# Patient Record
Sex: Female | Born: 1980 | Race: Black or African American | Hispanic: No | Marital: Married | Smoking: Never smoker
Health system: Southern US, Community
[De-identification: ages and names within clinical notes are randomized; demographics above are authoritative.]

## PROBLEM LIST (undated history)

## (undated) DIAGNOSIS — F329 Major depressive disorder, single episode, unspecified: Secondary | ICD-10-CM

## (undated) DIAGNOSIS — E669 Obesity, unspecified: Secondary | ICD-10-CM

## (undated) DIAGNOSIS — K219 Gastro-esophageal reflux disease without esophagitis: Secondary | ICD-10-CM

## (undated) DIAGNOSIS — I10 Essential (primary) hypertension: Secondary | ICD-10-CM

## (undated) DIAGNOSIS — F419 Anxiety disorder, unspecified: Secondary | ICD-10-CM

## (undated) DIAGNOSIS — F32A Depression, unspecified: Secondary | ICD-10-CM

## (undated) HISTORY — DX: Anxiety disorder, unspecified: F41.9

## (undated) HISTORY — DX: Major depressive disorder, single episode, unspecified: F32.9

## (undated) HISTORY — DX: Depression, unspecified: F32.A

## (undated) HISTORY — DX: Obesity, unspecified: E66.9

---

## 2001-07-21 ENCOUNTER — Emergency Department (HOSPITAL_COMMUNITY): Admission: EM | Admit: 2001-07-21 | Discharge: 2001-07-21 | Payer: Self-pay | Admitting: Emergency Medicine

## 2001-07-21 ENCOUNTER — Encounter: Payer: Self-pay | Admitting: Emergency Medicine

## 2003-06-15 ENCOUNTER — Other Ambulatory Visit: Admission: RE | Admit: 2003-06-15 | Discharge: 2003-06-15 | Payer: Self-pay | Admitting: Gynecology

## 2003-09-06 ENCOUNTER — Other Ambulatory Visit: Admission: RE | Admit: 2003-09-06 | Discharge: 2003-09-06 | Payer: Self-pay | Admitting: Gynecology

## 2005-07-31 ENCOUNTER — Other Ambulatory Visit: Admission: RE | Admit: 2005-07-31 | Discharge: 2005-07-31 | Payer: Self-pay | Admitting: Gynecology

## 2008-06-16 ENCOUNTER — Ambulatory Visit: Payer: Self-pay | Admitting: Women's Health

## 2008-06-16 ENCOUNTER — Other Ambulatory Visit: Admission: RE | Admit: 2008-06-16 | Discharge: 2008-06-16 | Payer: Self-pay | Admitting: Gynecology

## 2008-06-16 ENCOUNTER — Encounter: Payer: Self-pay | Admitting: Women's Health

## 2008-06-21 ENCOUNTER — Ambulatory Visit: Payer: Self-pay | Admitting: Women's Health

## 2008-07-17 ENCOUNTER — Ambulatory Visit: Payer: Self-pay | Admitting: Women's Health

## 2008-07-25 ENCOUNTER — Encounter: Admission: RE | Admit: 2008-07-25 | Discharge: 2008-10-23 | Payer: Self-pay | Admitting: Endocrinology

## 2009-07-24 ENCOUNTER — Ambulatory Visit: Payer: Self-pay | Admitting: Women's Health

## 2009-07-24 ENCOUNTER — Other Ambulatory Visit: Admission: RE | Admit: 2009-07-24 | Discharge: 2009-07-24 | Payer: Self-pay | Admitting: Gynecology

## 2010-09-26 ENCOUNTER — Telehealth: Payer: Self-pay

## 2010-09-26 NOTE — Telephone Encounter (Signed)
Patient called to report spotting x one week. Dark spotting. Usually has a few days of spotting before period starts but never a week.  Thinks her period is not due for about 5 more days.  Her main concern was cancer and I told her that was probably remote and unlikely and more likely to be something hormonal.  I asked her about the possibility of pregnancy and she said no but that she had been intimate and her partner did enter vagina with his penis but they stopped and did not have sex.  I told her that pregnancy could be another possibility although probably not likely. I recommended an office visit. [place] is off tomorrow and patient could not come Monday. She chose [place] 10/02/10 and she said that way she could see if her period does just start and she might cancel. I told her if anything abnormal about period or anymore spotting she would definitely want to be seen. Office visit scheduled 10/02/10 at 8:30am

## 2010-09-30 ENCOUNTER — Ambulatory Visit (INDEPENDENT_AMBULATORY_CARE_PROVIDER_SITE_OTHER): Payer: BC Managed Care – PPO | Admitting: Women's Health

## 2010-09-30 ENCOUNTER — Encounter: Payer: Self-pay | Admitting: Women's Health

## 2010-09-30 DIAGNOSIS — N760 Acute vaginitis: Secondary | ICD-10-CM

## 2010-09-30 DIAGNOSIS — A499 Bacterial infection, unspecified: Secondary | ICD-10-CM

## 2010-09-30 DIAGNOSIS — B9689 Other specified bacterial agents as the cause of diseases classified elsewhere: Secondary | ICD-10-CM

## 2010-09-30 DIAGNOSIS — N949 Unspecified condition associated with female genital organs and menstrual cycle: Secondary | ICD-10-CM

## 2010-09-30 DIAGNOSIS — N938 Other specified abnormal uterine and vaginal bleeding: Secondary | ICD-10-CM

## 2010-09-30 DIAGNOSIS — B373 Candidiasis of vulva and vagina: Secondary | ICD-10-CM

## 2010-09-30 MED ORDER — METRONIDAZOLE 500 MG PO TABS: 500.0000 mg | ORAL_TABLET | Freq: Two times a day (BID) | ORAL | Status: AC

## 2010-09-30 MED ORDER — FLUCONAZOLE 150 MG PO TABS: 150.0000 mg | ORAL_TABLET | Freq: Once | ORAL | Status: AC

## 2010-09-30 NOTE — Progress Notes (Signed)
  Presents with a complaint of spotting and unusual bleeding for about 2 weeks. LMP 8/15, states normal flow and normal time. States has a monthly cycle for about 5 days, uses condoms for contraception. She has had a new partner within the last year. Denies abdominal pain states has had some rest tenderness. She is also diabetic on glyburide. External genitalia is within normal limits, speculum exam moderate amount of menses type blood was noted with an odor. GC Chlamydia culture was taken and is pending, wet prep was positive for yeast and BV. Bimanual no CMT no adnexal fullness or tenderness she does weigh greater than 250, difficult to palpate uterine size.UPT-  Negative.  DUB x2 weeks, yeast and BV  Plan: Flagyl 500 by mouth twice a day for 7d, alcohol precautions were reviewed, Diflucan 150 by mouth x1 dose. Her cycle is due,.  Schedule annual exam.

## 2010-10-01 LAB — POCT URINE PREGNANCY: Preg Test, Ur: NEGATIVE

## 2010-10-01 NOTE — Progress Notes (Signed)
Addended by: Landis Martins R on: 10/01/2010 10:08 AM   Modules accepted: Orders

## 2010-10-02 ENCOUNTER — Ambulatory Visit: Payer: Self-pay | Admitting: Women's Health

## 2011-04-12 ENCOUNTER — Encounter (HOSPITAL_BASED_OUTPATIENT_CLINIC_OR_DEPARTMENT_OTHER): Payer: Self-pay | Admitting: *Deleted

## 2011-04-12 ENCOUNTER — Emergency Department (HOSPITAL_BASED_OUTPATIENT_CLINIC_OR_DEPARTMENT_OTHER)
Admission: EM | Admit: 2011-04-12 | Discharge: 2011-04-12 | Disposition: A | Discharge status: Home / Self Care | Payer: BC Managed Care – PPO | Attending: Emergency Medicine | Admitting: Emergency Medicine

## 2011-04-12 DIAGNOSIS — T169XXA Foreign body in ear, unspecified ear, initial encounter: Secondary | ICD-10-CM | POA: Insufficient documentation

## 2011-04-12 DIAGNOSIS — E119 Type 2 diabetes mellitus without complications: Secondary | ICD-10-CM | POA: Insufficient documentation

## 2011-04-12 DIAGNOSIS — IMO0002 Reserved for concepts with insufficient information to code with codable children: Secondary | ICD-10-CM | POA: Insufficient documentation

## 2011-04-12 NOTE — ED Notes (Signed)
Pt d/c home- no rx given-  

## 2011-04-12 NOTE — ED Notes (Signed)
Pt presents to ED today with part of a q-tip in left ear.  Pt attempted at home to remove with no success.

## 2011-04-12 NOTE — Discharge Instructions (Signed)
Ear Foreign Body An ear foreign body is an object that is stuck in the ear. Objects in the ear can cause pain, hearing loss, and buzzing or roaring sounds. They can also cause fluid to come from the ear. HOME CARE   Keep all doctor visits as told.   Keep small objects away from children. Tell them not to put things in their ears.  GET HELP RIGHT AWAY IF:   You have blood coming from your ear.   You have more pain or puffiness (swelling) in the ear.   You have trouble hearing.   You have fluid (discharge) coming from the ear.   You have a fever.   You get a headache.  MAKE SURE YOU:   Understand these instructions.   Will watch your condition.   Will get help right away if you are not doing well or get worse.  Document Released: 06/26/2009 Document Revised: 12/26/2010 Document Reviewed: 06/26/2009 Exit[employer] Patient Information 2012 [employer]. 

## 2011-04-12 NOTE — ED Provider Notes (Signed)
History     CSN: [id number]  Arrival date & time 04/12/11  2134   First MD Initiated Contact with Patient 04/12/11 2238      Chief Complaint  Patient presents with  . Foreign Body in Ear    (Consider location/radiation/quality/duration/timing/severity/associated sxs/prior treatment) HPI Comments: Pt states that she got piece of the q-tip in her ear:pt states that it happened earlier  Patient is a 31 y.o. female presenting with foreign body in ear. The history is provided by the patient. No language interpreter was used.  Foreign Body in Ear This is a new problem. The current episode started today. The problem occurs constantly. The problem has been unchanged.    Past Medical History  Diagnosis Date  . Diabetes mellitus 05/2008    DR. BALAN  . Obesity     History reviewed. No pertinent past surgical history.  Family History  Problem Relation Age of Onset  . Diabetes Mother   . Hypertension Mother   . Diabetes Maternal Aunt   . Diabetes Maternal Grandfather     History  Substance Use Topics  . Smoking status: Never Smoker   . Smokeless tobacco: Never Used  . Alcohol Use: No    OB History    Grav Para Term Preterm Abortions TAB SAB Ect Mult Living                  Review of Systems  Constitutional: Negative.   Eyes: Negative.   Respiratory: Negative.   Cardiovascular: Negative.     Allergies  Metformin and related  Home Medications   Current Outpatient Rx  Name Route Sig Dispense Refill  . GLYBURIDE 2.5 MG PO TABS Oral Take 1.25 mg by mouth 2 (two) times daily with a meal.    . MULTIVITAMIN GUMMIES ADULT PO Oral Take 2 each by mouth daily.      BP 138/88  Pulse 100  Temp(Src) 97.8 F (36.6 C) (Oral)  Resp 20  SpO2 98%  Physical Exam  Nursing note and vitals reviewed. Constitutional: She appears well-developed and well-nourished.  HENT:  Right Ear: External ear normal.  Left Ear: External ear normal.       fb noted in the left ear canal   Eyes: EOM are normal.  Neck: Neck supple.  Pulmonary/Chest: Effort normal and breath sounds normal.    ED Course  Procedures (including critical care time)  Labs Reviewed - No data to display No results found.   1. Foreign body in ear       MDM  Cotton ball removed with tweezers:pt tolerated without any problem:tm intact after procedure        Teressa Lower, NP 04/12/11 2344

## 2011-04-13 NOTE — ED Provider Notes (Signed)
Medical screening examination/treatment/procedure(s) were performed by non-physician practitioner and as supervising physician I was immediately available for consultation/collaboration.  Shaliyah Taite T Abdulloh Ullom, MD 04/13/11 2330 

## 2011-08-20 ENCOUNTER — Ambulatory Visit: Payer: BC Managed Care – PPO | Admitting: Women's Health

## 2011-08-21 ENCOUNTER — Ambulatory Visit: Payer: BC Managed Care – PPO | Admitting: Women's Health

## 2011-09-01 ENCOUNTER — Ambulatory Visit: Payer: BC Managed Care – PPO | Admitting: Women's Health

## 2012-02-08 ENCOUNTER — Emergency Department (HOSPITAL_BASED_OUTPATIENT_CLINIC_OR_DEPARTMENT_OTHER)
Admission: EM | Admit: 2012-02-08 | Discharge: 2012-02-08 | Disposition: A | Discharge status: Home / Self Care | Payer: BC Managed Care – PPO | Attending: Emergency Medicine | Admitting: Emergency Medicine

## 2012-02-08 ENCOUNTER — Encounter (HOSPITAL_BASED_OUTPATIENT_CLINIC_OR_DEPARTMENT_OTHER): Payer: Self-pay | Admitting: *Deleted

## 2012-02-08 DIAGNOSIS — Y9301 Activity, walking, marching and hiking: Secondary | ICD-10-CM | POA: Insufficient documentation

## 2012-02-08 DIAGNOSIS — E119 Type 2 diabetes mellitus without complications: Secondary | ICD-10-CM | POA: Insufficient documentation

## 2012-02-08 DIAGNOSIS — S91309A Unspecified open wound, unspecified foot, initial encounter: Secondary | ICD-10-CM | POA: Insufficient documentation

## 2012-02-08 DIAGNOSIS — W1809XA Striking against other object with subsequent fall, initial encounter: Secondary | ICD-10-CM | POA: Insufficient documentation

## 2012-02-08 DIAGNOSIS — Z79899 Other long term (current) drug therapy: Secondary | ICD-10-CM | POA: Insufficient documentation

## 2012-02-08 DIAGNOSIS — Y9289 Other specified places as the place of occurrence of the external cause: Secondary | ICD-10-CM | POA: Insufficient documentation

## 2012-02-08 DIAGNOSIS — E669 Obesity, unspecified: Secondary | ICD-10-CM | POA: Insufficient documentation

## 2012-02-08 DIAGNOSIS — I10 Essential (primary) hypertension: Secondary | ICD-10-CM | POA: Insufficient documentation

## 2012-02-08 DIAGNOSIS — Z23 Encounter for immunization: Secondary | ICD-10-CM | POA: Insufficient documentation

## 2012-02-08 DIAGNOSIS — S91319A Laceration without foreign body, unspecified foot, initial encounter: Secondary | ICD-10-CM

## 2012-02-08 HISTORY — DX: Essential (primary) hypertension: I10

## 2012-02-08 MED ORDER — CEPHALEXIN 500 MG PO CAPS: 500.0000 mg | ORAL_CAPSULE | Freq: Four times a day (QID) | ORAL | Status: DC

## 2012-02-08 MED ORDER — TETANUS-DIPHTH-ACELL PERTUSSIS 5-2.5-18.5 LF-MCG/0.5 IM SUSP
0.5000 mL | Freq: Once | INTRAMUSCULAR | Status: AC
  Administered 2012-02-08: 0.5 mL via INTRAMUSCULAR
  Filled 2012-02-08: qty 0.5

## 2012-02-08 NOTE — ED Provider Notes (Signed)
History   This chart was scribed for Geoffery Lyons, MD by Melba Coon, ED Scribe. The patient was seen in room MH01/MH01 and the patient's care was started at 10:25PM.    CSN: [id number]  Arrival date & time 02/08/12  2204   First MD Initiated Contact with Patient 02/08/12 2224      Chief Complaint  Patient presents with  . Foot Injury    (Consider location/radiation/quality/duration/timing/severity/associated sxs/prior treatment) The history is provided by the patient. No language interpreter was used.   [name] [name] is a 32 y.o. female who presents to the Emergency Department complaining of left foot pain pertaining to a fall and laceration with an onset a hour ago. She reports she was walking down the stairs when she slipped and cut her foot. She was ambulatory after the fall but ambulation is decreased compared to baseline. However, she reports that the pain in her foot and her ambulation has gotten better since onset. Denies HA, fever, neck pain, sore throat, rash, back pain, CP, SOB, abdominal pain, nausea, emesis, diarrhea, dysuria, or extremity edema, weakness, numbness, or tingling. Hx of DM. Tetanus shot is not up to date. No other pertinent medical symptoms.   Past Medical History  Diagnosis Date  . Diabetes mellitus 05/2008    DR. BALAN  . Obesity   . Hypertension     History reviewed. No pertinent past surgical history.  Family History  Problem Relation Age of Onset  . Diabetes Mother   . Hypertension Mother   . Diabetes Maternal Aunt   . Diabetes Maternal Grandfather     History  Substance Use Topics  . Smoking status: Never Smoker   . Smokeless tobacco: Never Used  . Alcohol Use: No    OB History    Grav Para Term Preterm Abortions TAB SAB Ect Mult Living                  Review of Systems 10 Systems reviewed and all are negative for acute change except as noted in the HPI.   Allergies  Metformin and related  Home Medications   Current  Outpatient Rx  Name  Route  Sig  Dispense  Refill  . LISINOPRIL 5 MG PO TABS   Oral   Take 5 mg by mouth daily.         . GLYBURIDE 2.5 MG PO TABS   Oral   Take 1.25 mg by mouth 2 (two) times daily with a meal.         . MULTIVITAMIN GUMMIES ADULT PO   Oral   Take 2 each by mouth daily.           BP 120/80  Pulse 106  Temp 98.3 F (36.8 C) (Oral)  Resp 18  Ht 5\' 9"  (1.753 m)  Wt 300 lb (136.079 kg)  BMI 44.30 kg/m2  SpO2 99%  LMP 01/18/2012  Physical Exam  Nursing note and vitals reviewed. Constitutional:       Awake, alert, nontoxic appearance.  HENT:  Head: Atraumatic.  Eyes: Right eye exhibits no discharge. Left eye exhibits no discharge.  Neck: Neck supple.  Pulmonary/Chest: Effort normal. She exhibits no tenderness.  Abdominal: Soft. There is no tenderness. There is no rebound.  Musculoskeletal: She exhibits tenderness (left foot).       Baseline ROM, no obvious new focal weakness.  Neurological:       Mental status and motor strength appears baseline for patient and situation.  Skin:  Left foot: tissues between the 4th and 5th toes and 3rd and 4th toes have small 1 cm superficial lacerations without active bleeding. No swelling or deformity of the toes or foot.  Psychiatric: She has a normal mood and affect.    ED Course  Procedures (including critical care time)  DIAGNOSTIC STUDIES: Oxygen Saturation is 99% on room air, normal by my interpretation.    COORDINATION OF CARE:  10:30PM - Tetanus shot and keflex will be ordered for [name] [name]. She is advised to soak her foot in warm water and keep the lacerated area of the left foot clean. She is ready for d/c.    Labs Reviewed - No data to display No results found.   No diagnosis found.    MDM  There are lacerations between the toes that do not require repair.  Tdap updated.  As she is diabetic, I will prescribe a short course of keflex.  Local wound care and return prn.       I  personally performed the services described in this documentation, which was scribed in my presence. The recorded information has been reviewed and is accurate.         Geoffery Lyons, MD 02/08/12 [id number]

## 2012-02-08 NOTE — ED Notes (Signed)
D/c home with rx x 1 for keflex- wound care discussed and pt verbalized understanding- tdap info sheet given

## 2012-02-08 NOTE — ED Notes (Signed)
Pt states she fell down 3 or 4 steps this evening hurting her left. C/o 4th and 5th toes hurting and a small laceration noted to the posterior aspect of left foot less than one inch. Able to ambulate but painful per pt. States she will need her Td updated. Pt. Would like to be evaluated by the MD before xrays ordered.

## 2012-02-27 ENCOUNTER — Ambulatory Visit (INDEPENDENT_AMBULATORY_CARE_PROVIDER_SITE_OTHER): Payer: BC Managed Care – PPO | Admitting: General Surgery

## 2012-02-27 ENCOUNTER — Encounter (INDEPENDENT_AMBULATORY_CARE_PROVIDER_SITE_OTHER): Payer: Self-pay | Admitting: General Surgery

## 2012-02-27 DIAGNOSIS — Z6841 Body Mass Index (BMI) 40.0 and over, adult: Secondary | ICD-10-CM

## 2012-02-27 DIAGNOSIS — I10 Essential (primary) hypertension: Secondary | ICD-10-CM

## 2012-02-27 DIAGNOSIS — E119 Type 2 diabetes mellitus without complications: Secondary | ICD-10-CM

## 2012-02-27 NOTE — Progress Notes (Signed)
Patient ID: [name] [name], female   DOB: 02-17-1980, 32 y.o.   MRN: [id number]  Chief Complaint  Patient presents with  . Bariatric Pre-op    HPI [name] [name] is a 32 y.o. female.  This patient presents for her initial weight loss surgery evaluation. She has a BMI of 44 with comorbidities of diabetes mellitus and hypertension. She says that she struggled with her weight since her early 7s when she began making some poor food choices and working multiple jobs. She has attended our information session and is interested in lap band. She is interested in a slower weight loss and is not interested in a sleeve gastrectomy due to the irreversible nature of the procedure. She is fairly recent bleed diagnosed with diabetes or last 3 years and her last A1c was around 8. She says that when she does exercise she does do request is and enjoys walking outdoors. She denies any history of heartburn or reflux HPI  Past Medical History  Diagnosis Date  . Diabetes mellitus 05/2008    DR. BALAN  . Obesity   . Hypertension     History reviewed. No pertinent past surgical history.  Family History  Problem Relation Age of Onset  . Diabetes Mother   . Hypertension Mother   . Diabetes Maternal Aunt   . Diabetes Maternal Grandfather     Social History History  Substance Use Topics  . Smoking status: Never Smoker   . Smokeless tobacco: Never Used  . Alcohol Use: No    Allergies  Allergen Reactions  . Metformin And Related Itching    Current Outpatient Prescriptions  Medication Sig Dispense Refill  . cephALEXin (KEFLEX) 500 MG capsule Take 1 capsule (500 mg total) by mouth 4 (four) times daily.  25 capsule  0  . glyBURIDE (DIABETA) 2.5 MG tablet Take 1.25 mg by mouth 2 (two) times daily with a meal.      . lisinopril (PRINIVIL,ZESTRIL) 5 MG tablet Take 5 mg by mouth daily.      . Multiple Vitamins-Minerals (MULTIVITAMIN GUMMIES ADULT PO) Take 2 each by mouth daily.        Review of  Systems Review of Systems All other review of systems negative or noncontributory except as stated in the HPI  Blood pressure 142/84, pulse 88, temperature 97.1 F (36.2 C), temperature source Temporal, resp. rate 16, height 5' 9.5" (1.765 m), weight 305 lb 3.2 oz (138.438 kg), last menstrual period 01/18/2012.  Physical Exam Physical Exam Physical Exam  Nursing note and vitals reviewed. Constitutional: She is oriented to person, place, and time. She appears well-developed and well-nourished. No distress.  HENT:  Head: Normocephalic and atraumatic.  Mouth/Throat: No oropharyngeal exudate.  Eyes: Conjunctivae and EOM are normal. Pupils are equal, round, and reactive to light. Right eye exhibits no discharge. Left eye exhibits no discharge. No scleral icterus.  Neck: Normal range of motion. Neck supple. No tracheal deviation present.  Cardiovascular: Normal rate, regular rhythm, normal heart sounds and intact distal pulses.   Pulmonary/Chest: Effort normal and breath sounds normal. No stridor. No respiratory distress. She has no wheezes.  Abdominal: Soft. Bowel sounds are normal. She exhibits no distension and no mass. There is no tenderness. There is no rebound and no guarding.  Musculoskeletal: Normal range of motion. She exhibits no edema and no tenderness.  Neurological: She is alert and oriented to person, place, and time.  Skin: Skin is warm and dry. No rash noted. She is not diaphoretic.  No erythema. No pallor.  Psychiatric: She has a normal mood and affect. Her behavior is normal. Judgment and thought content normal.    Data Reviewed   Assessment    Morbid obesity with a BMI of 44 and obesity related comorbidities of diabetes mellitus and hypertension We discussed all of the nonsurgical and surgical weight loss options including the lap band, a sleeve gastrectomy, and Roux-en-Y gastric bypass. She is most interested in the lap band. We discussed the pros and cons of the surgery  and the risks and benefits. We discussed the risks of infection, bleeding, pain, scarring, 2 littler too much weight loss, dysphagia, esophageal dilation in dysmotility, erosion, slippage, vitamin deficiencies, and the need for reoperative surgery and she expressed understanding and would like to proceed with lap band.    Plan    We will set her up for her nutrition labs an upper GI and get her to see a psychologist and nutritionist for their initial weight loss surgery evaluations       Brady Schiller DAVID 02/27/2012, 12:59 PM

## 2012-03-16 ENCOUNTER — Ambulatory Visit (HOSPITAL_COMMUNITY)
Admission: RE | Admit: 2012-03-16 | Discharge: 2012-03-16 | Disposition: A | Discharge status: Home / Self Care | Payer: BC Managed Care – PPO | Source: Ambulatory Visit | Attending: General Surgery | Admitting: General Surgery

## 2012-03-16 ENCOUNTER — Encounter (HOSPITAL_COMMUNITY): Payer: Self-pay

## 2012-03-16 DIAGNOSIS — I10 Essential (primary) hypertension: Secondary | ICD-10-CM | POA: Insufficient documentation

## 2012-03-16 DIAGNOSIS — Z6841 Body Mass Index (BMI) 40.0 and over, adult: Secondary | ICD-10-CM | POA: Insufficient documentation

## 2012-03-16 DIAGNOSIS — E119 Type 2 diabetes mellitus without complications: Secondary | ICD-10-CM

## 2012-03-17 ENCOUNTER — Ambulatory Visit: Payer: BC Managed Care – PPO | Admitting: *Deleted

## 2012-03-24 ENCOUNTER — Encounter: Payer: Self-pay | Admitting: *Deleted

## 2012-03-24 ENCOUNTER — Encounter: Payer: BC Managed Care – PPO | Attending: General Surgery | Admitting: *Deleted

## 2012-03-24 DIAGNOSIS — Z713 Dietary counseling and surveillance: Secondary | ICD-10-CM | POA: Insufficient documentation

## 2012-03-24 NOTE — Progress Notes (Addendum)
  Pre-Op Assessment Visit:  Pre-Operative RYGB Surgery  Medical Nutrition Therapy:  Appt start time: 0800   End time:  0900.  Patient was seen on 03/24/2012 for Pre-Operative RYGB Nutrition Assessment. Assessment and letter of approval faxed to [facility] Bariatric Surgery Program coordinator on 03/24/2012.  Approval letter sent to [facility] and will be available in the chart under the media tab.  Handouts given during visit include:  Pre-Op Goals   Bariatric Surgery Protein Shakes  Patient to call for Pre-Op and Post-Op Nutrition Education at the [facility] when surgery is scheduled.

## 2012-03-24 NOTE — Patient Instructions (Addendum)
   Follow Pre-Op Nutrition Goals to prepare for Gastric Bypass Surgery.   Call the [facility] at [phone] once you have been given your surgery date to enrolled in the Pre-Op Nutrition Class. You will need to attend this nutrition class 3-4 weeks prior to your surgery. 

## 2012-04-20 ENCOUNTER — Encounter: Payer: Self-pay | Admitting: *Deleted

## 2012-04-20 ENCOUNTER — Encounter: Payer: BC Managed Care – PPO | Attending: General Surgery | Admitting: *Deleted

## 2012-04-20 DIAGNOSIS — Z713 Dietary counseling and surveillance: Secondary | ICD-10-CM | POA: Insufficient documentation

## 2012-04-20 NOTE — Progress Notes (Signed)
  6 Months Supervised Weight Loss Visit:   Pre-Operative RYGB Surgery  Medical Nutrition Therapy:  Appt start time: 0830 end time:  0845.  Primary concerns today:   Pre-operative Bariatric Surgery Supervised Weight Loss. Has decreased soda from 1 daily to 1 per week. Paying attention to serving sizes and replacing foods like french fries with soup. Plans to start walking with roommate as weather warms.   Weight today: 309.5 lbs Weight change: 1.7 lb GAIN BMI: 45.0   Medications: No changes reported Supplementation: MVI  24-hr recall: B (AM): Belvita (1 pk), 5 oz greek yogurt (Chobani Vanilla) Snk (AM): NONE or popcorn  L (PM): Chick-fil-a sandwich (wheat bun), cup of chicken tortilla soup, water or diet drink Snk (PM): NONE or popcorn or banana Fig Newton Crisps (1 svg) D (PM): Salmon or tilapia w/ spinach, asparagus or broccoli and brown rice; water Snk (PM):  NONE or 1.5 c Cheerios w/ 2% milk (if dinner is early)  CBG monitoring: Weekly Average FBG per patient: 117 mg (@ Dr. Talmage Nap) Last patient reported A1c: 8.0% (Jan 2014)  Recent physical activity:  None at this time.   Progress Towards Goal(s):  In progress.   Nutritional Diagnosis:  [place]-3.3 Obesity related to past poor dietary habits and physical inactivity as evidenced by patient attending supervised weight loss for insurance approval of bariatric surgery.    Intervention:  Nutrition education including CHO metabolism and effect on weight gain/loss. Also discussed importance of exercise.   Monitoring/Evaluation:  Dietary intake, exercise, and body weight. Follow up in 1 month for 3rd supervised weight loss visit.

## 2012-04-20 NOTE — Patient Instructions (Addendum)
   Continue to follow Pre-Op Nutrition Goals to prepare for Gastric Bypass Surgery.  Continue replacing high fat and high carb foods with healthier choices.  Aim for >30 min of physical activity daily.   Call the [facility] at [phone] once you have been given your surgery date to enrolled in the Pre-Op Nutrition Class. You will need to attend this nutrition class 3-4 weeks prior to your surgery.

## 2012-05-27 ENCOUNTER — Encounter: Payer: BC Managed Care – PPO | Attending: General Surgery | Admitting: *Deleted

## 2012-05-27 DIAGNOSIS — Z713 Dietary counseling and surveillance: Secondary | ICD-10-CM | POA: Insufficient documentation

## 2012-05-27 NOTE — Progress Notes (Addendum)
  6 Months Supervised Weight Loss Visit:   Pre-Operative RYGB Surgery  Medical Nutrition Therapy:  Appt start time: 0945  End time:  1000.  Primary concerns today:   Pre-operative Bariatric Surgery Supervised Weight Loss. Continues to work on goals set at previous visits. Down 3 lbs.   Weight today: 306.5 lbs Weight change: 3.0 lbs BMI:  44.6  Medications: No changes reported Supplementation: MVI  CBG monitoring: Weekly Average FBG per patient: 117 mg (@ Dr. Talmage Nap) Last patient reported A1c: 8.0% (Jan 2014)  Recent physical activity:  None at this time.   Estimated daily energy needs (for wt loss) 1500 calories 185 g CHO 70-80 g protein 45 g fat  Progress Towards Goal(s):  In progress.   Nutritional Diagnosis:  [place]-3.3 Obesity related to past poor dietary habits and physical inactivity as evidenced by patient attending supervised weight loss for insurance approval of bariatric surgery.    Intervention:  Continued nutrition education on importance of exercise both pre- and post-op.  Monitoring/Evaluation:  Dietary intake, exercise, and body weight. Follow up in 1 month for 4th supervised weight loss visit.

## 2012-05-27 NOTE — Patient Instructions (Addendum)
Goals:  Eat 3 meals/day, Avoid meal skipping   Have lean, protein rich foods with all carbohydrates  Limit carbohydrate 2 servings/meal and 1 serving/snack  Choose more whole grains, lean protein, low-fat dairy, and fruits/non-starchy vegetables.   Aim for >30 min of physical activity daily  Limit sugar-sweetened beverages and concentrated sweets

## 2012-07-01 ENCOUNTER — Encounter: Payer: BC Managed Care – PPO | Attending: General Surgery | Admitting: *Deleted

## 2012-07-01 DIAGNOSIS — Z713 Dietary counseling and surveillance: Secondary | ICD-10-CM | POA: Insufficient documentation

## 2012-07-01 NOTE — Progress Notes (Signed)
  6 Months Supervised Weight Loss Visit:   Pre-Operative RYGB Surgery  Medical Nutrition Therapy:  Appt start time: 0930  End time:  1000.  Primary concerns today:   Pre-operative Bariatric Surgery Supervised Weight Loss. Continues to work on goals set at previous visits, though up 1.2 lbs.   Weight today: 307.7 lbs Weight change: 1.2 lb GAIN BMI:  44.8 kg/m^2  Medications: No changes reported Supplementation: MVI  Last patient reported A1c: 8.0% (Jan 2014)  Recent physical activity:  None at this time.   Estimated daily energy needs (for wt loss) 1500 calories 185 g CHO 70-80 g protein 45 g fat  Progress Towards Goal(s):  In progress.   Nutritional Diagnosis:  [place]-3.3 Obesity related to past poor dietary habits and physical inactivity as evidenced by patient attending supervised weight loss for insurance approval of bariatric surgery.    Intervention:  Continued nutrition education on importance of exercise both pre- and post-op. Also discussed handout "Emotional Eating: The Facts".   Monitoring/Evaluation:  Dietary intake, exercise, and body weight. Follow up in 1 month for supervised weight loss visit.

## 2012-07-06 LAB — CBC WITH DIFFERENTIAL/PLATELET
Basophils Absolute: 0 10*3/uL (ref 0.0–0.1)
Lymphocytes Relative: 28 % (ref 12–46)
Lymphs Abs: 2 10*3/uL (ref 0.7–4.0)
Neutro Abs: 4.3 10*3/uL (ref 1.7–7.7)
Platelets: 364 10*3/uL (ref 150–400)
RBC: 4.86 MIL/uL (ref 3.87–5.11)
RDW: 16.1 % — ABNORMAL HIGH (ref 11.5–15.5)
WBC: 7.2 10*3/uL (ref 4.0–10.5)

## 2012-07-06 LAB — H. PYLORI ANTIBODY, IGG: H Pylori IgG: 4.7 {ISR} — ABNORMAL HIGH

## 2012-07-06 LAB — COMPREHENSIVE METABOLIC PANEL
ALT: 15 U/L (ref 0–35)
AST: 16 U/L (ref 0–37)
Albumin: 3.4 g/dL — ABNORMAL LOW (ref 3.5–5.2)
CO2: 27 mEq/L (ref 19–32)
Calcium: 8.9 mg/dL (ref 8.4–10.5)
Chloride: 103 mEq/L (ref 96–112)
Potassium: 4.3 mEq/L (ref 3.5–5.3)
Sodium: 138 mEq/L (ref 135–145)
Total Protein: 7.5 g/dL (ref 6.0–8.3)

## 2012-07-06 LAB — LIPID PANEL
HDL: 51 mg/dL (ref >39)
LDL Cholesterol: 81 mg/dL (ref 0–99)
Total CHOL/HDL Ratio: 2.9 Ratio

## 2012-07-06 LAB — HEMOGLOBIN A1C: Mean Plasma Glucose: 197 mg/dL — ABNORMAL HIGH (ref <117)

## 2012-07-16 ENCOUNTER — Telehealth (INDEPENDENT_AMBULATORY_CARE_PROVIDER_SITE_OTHER): Payer: Self-pay

## 2012-07-16 ENCOUNTER — Other Ambulatory Visit (INDEPENDENT_AMBULATORY_CARE_PROVIDER_SITE_OTHER): Payer: Self-pay

## 2012-07-16 DIAGNOSIS — A048 Other specified bacterial intestinal infections: Secondary | ICD-10-CM

## 2012-07-16 MED ORDER — AMOXICILL-CLARITHRO-LANSOPRAZ PO MISC: Freq: Two times a day (BID) | ORAL | Status: DC

## 2012-07-16 NOTE — Telephone Encounter (Signed)
Message copied by Maryan Puls on Fri Jul 16, 2012  3:18 PM ------      Message from: Leandrew Koyanagi      Created: Thu Jul 15, 2012  2:49 PM      Regarding: Prevpac for positive h. pylori       Azucena Kuba,            Dr Biagio Quint told me this patient will need prevpac for her positive h. Pylori. Please have her call when she finishes so Sherion knows to schedule her breath-tek. The breath-tek will need to be at least four weeks after the patient finishes the prevpac.            Thanks,            Coy Saunas ------

## 2012-07-16 NOTE — Telephone Encounter (Signed)
Called patient with H-Pylori results (positive) patient aware that she will need a Pre-Pac for treatment.  Ordered Prev-Pac via EPIC at [facility] on [address].  Patient will call our office Two (2) weeks after completing the Prev-Pac to set up a Breath Tek in Endo

## 2012-07-30 ENCOUNTER — Other Ambulatory Visit (INDEPENDENT_AMBULATORY_CARE_PROVIDER_SITE_OTHER): Payer: Self-pay | Admitting: General Surgery

## 2012-07-30 DIAGNOSIS — E669 Obesity, unspecified: Secondary | ICD-10-CM

## 2012-08-02 NOTE — Telephone Encounter (Signed)
Patient is finishing her antibiotics today. She would like to schedule Breath Tek asap.

## 2012-08-04 NOTE — Patient Instructions (Addendum)
Goals:  Eat 3 meals/day, Avoid meal skipping   Have lean, protein rich foods with all carbohydrates  Limit carbohydrate 2 servings/meal and 1 serving/snack  Choose more whole grains, lean protein, low-fat dairy, and fruits/non-starchy vegetables.   Aim for >30 min of physical activity daily  Limit sugar-sweetened beverages and concentrated sweets 

## 2012-08-05 ENCOUNTER — Telehealth (INDEPENDENT_AMBULATORY_CARE_PROVIDER_SITE_OTHER): Payer: Self-pay

## 2012-08-05 NOTE — Telephone Encounter (Signed)
Message copied by Maryan Puls on Thu Aug 05, 2012  3:31 PM ------      Message from: Larry Sierras      Created: Wed Aug 04, 2012 10:28 AM      Regarding: TESTS      Contact: [phone]       PT CALLING BACK PER BL INSTRUCT/COMPLETED ANTIBIOTICS/DOES SHE NEED F/U TEST? GAYLE ------

## 2012-08-05 NOTE — Telephone Encounter (Signed)
Rec'd call from Endo, patient has been scheduled for her Breath Tek on 08/23/12 @ 7:30 am-[place].  Patient has been contacted with appt date & time.

## 2012-08-05 NOTE — Telephone Encounter (Signed)
Called and spoke to patient regarding scheduling her Breath Tek.  Patient last day of taking antibiotics was 07/30/12.  Called [facility] and left a message for Noreene Larsson to call me back to schedule Breath Tek for patient.

## 2012-08-12 ENCOUNTER — Encounter (INDEPENDENT_AMBULATORY_CARE_PROVIDER_SITE_OTHER): Payer: Self-pay | Admitting: General Surgery

## 2012-08-12 ENCOUNTER — Encounter: Payer: BC Managed Care – PPO | Attending: General Surgery

## 2012-08-12 ENCOUNTER — Ambulatory Visit (INDEPENDENT_AMBULATORY_CARE_PROVIDER_SITE_OTHER): Payer: BC Managed Care – PPO | Admitting: General Surgery

## 2012-08-12 DIAGNOSIS — Z713 Dietary counseling and surveillance: Secondary | ICD-10-CM | POA: Insufficient documentation

## 2012-08-12 NOTE — Progress Notes (Signed)
Patient ID: [name] [name], female   DOB: 08/30/1980, 32 y.o.   MRN: [id number]  Chief Complaint  Patient presents with  . Bariatric Pre-op    HPI [name] [name] is a 32 y.o. female. This patient presents for her preoperative surgery evaluation in preparation for Roux-en-Y gastric bypass. She has a BMI of 45.9 with obesity related comorbidities of hypertension and diabetes mellitus. I saw her initially and at that time she was most interested in the lap band. She was required to wait 6 months for her physician supervised weight loss by her insurance company and over that time she says that she has done some additional research and talked with her endocrinologist and she now feels that gastric bypass would be the best option for her. This is mainly for the best control of her diabetes. She was H. Pylori positive and she has completed her triple therapy and is scheduled for her breath tek. Otherwise she has completed her nutrition and psychology evaluation and her upper GI which was normal. HPI  Past Medical History  Diagnosis Date  . Diabetes mellitus 05/2008    DR. BALAN  . Obesity   . Hypertension     History reviewed. No pertinent past surgical history.  Family History  Problem Relation Age of Onset  . Diabetes Mother   . Hypertension Mother   . Diabetes Maternal Aunt   . Diabetes Maternal Grandfather     Social History History  Substance Use Topics  . Smoking status: Never Smoker   . Smokeless tobacco: Never Used  . Alcohol Use: No    Allergies  Allergen Reactions  . Metformin And Related Itching    Current Outpatient Prescriptions  Medication Sig Dispense Refill  . amoxicillin-clarithromycin-lansoprazole (PREVPAC) combo pack Take by mouth 2 (two) times daily. Follow package directions.  1 kit  0  . glyBURIDE (DIABETA) 2.5 MG tablet Take 1.25 mg by mouth 2 (two) times daily with a meal.      . lisinopril (PRINIVIL,ZESTRIL) 5 MG tablet Take 5 mg by mouth daily.      .  Multiple Vitamins-Minerals (MULTIVITAMIN GUMMIES ADULT PO) Take 2 each by mouth daily.       No current facility-administered medications for this visit.    Review of Systems Review of Systems All other review of systems negative or noncontributory except as stated in the HPI  Blood pressure 132/80, pulse 72, temperature 97.8 F (36.6 C), temperature source Temporal, resp. rate 15, height 5' 9" (1.753 m), weight 311 lb 3.2 oz (141.159 kg).  Physical Exam Physical Exam Physical Exam  Nursing note and vitals reviewed. Constitutional: She is oriented to person, place, and time. She appears well-developed and well-nourished. No distress.  HENT:  Head: Normocephalic and atraumatic.  Mouth/Throat: No oropharyngeal exudate.  Eyes: Conjunctivae and EOM are normal. Pupils are equal, round, and reactive to light. Right eye exhibits no discharge. Left eye exhibits no discharge. No scleral icterus.  Neck: Normal range of motion. Neck supple. No tracheal deviation present.  Cardiovascular: Normal rate, regular rhythm, normal heart sounds and intact distal pulses.   Pulmonary/Chest: Effort normal and breath sounds normal. No stridor. No respiratory distress. She has no wheezes.  Abdominal: Soft. Bowel sounds are normal. She exhibits no distension and no mass. There is no tenderness. There is no rebound and no guarding.  Musculoskeletal: Normal range of motion. She exhibits no edema and no tenderness.  Neurological: She is alert and oriented to person, place, and time.    Skin: Skin is warm and dry. No rash noted. She is not diaphoretic. No erythema. No pallor.  Psychiatric: She has a normal mood and affect. Her behavior is normal. Judgment and thought content normal.    Data Reviewed   Assessment    Morbid obesity with a BMI of 45.9 and obesity related comorbidities of hypertension and diabetes mellitus We again discussed the options for lap band, vertical sleeve Gastrectomy, and Roux-en-Y  gastric bypass and she is interested in the gastric bypass. She is a fine candidate for any of the procedures.  We discussed the pros and cons of procedure and the risks of the procedure.The risks of infection, bleeding, pain, scarring, weight regain, too little or too much weight loss, vitamin deficiencies and need for lifelong vitamin supplementation, hair loss, need for protein supplementation, leaks, stricture, reflux, food intolerance, need for reoperation , need for open surgery, injury to spleen or surrounding structures, DVT's, PE, and death again discussed with the patient and the patient expressed understanding and desires to proceed with laparoscopic RYGB, possible open, intraoperative endoscopy.  We discussed the perioperative course as well as the long-term complications such as internal hernia and dumping syndrome vitamin deficiencies. She remains interested in the Roux-en-Y gastric bypass mainly for best control of her diabetes.      Plan    She will start on her preoperative diet today of and we will obtain BreathTek analysis to insure eradication of her H. Pylori and we will plan for Roux-en-Y gastric bypass as scheduled        [name] [name] DAVID 08/12/2012, 12:37 PM    

## 2012-08-16 ENCOUNTER — Encounter (HOSPITAL_COMMUNITY): Payer: Self-pay | Admitting: Pharmacy Technician

## 2012-08-19 ENCOUNTER — Encounter (HOSPITAL_COMMUNITY): Payer: Self-pay | Admitting: Pharmacy Technician

## 2012-08-19 NOTE — Progress Notes (Signed)
EKG 03/16/12 on EPIC

## 2012-08-20 ENCOUNTER — Ambulatory Visit (HOSPITAL_COMMUNITY)
Admission: RE | Admit: 2012-08-20 | Discharge: 2012-08-20 | Disposition: A | Discharge status: Home / Self Care | Payer: BC Managed Care – PPO | Source: Ambulatory Visit | Attending: General Surgery | Admitting: General Surgery

## 2012-08-20 ENCOUNTER — Encounter (HOSPITAL_COMMUNITY)
Admission: RE | Disposition: A | Discharge status: Home / Self Care | Payer: Self-pay | Source: Ambulatory Visit | Attending: General Surgery

## 2012-08-20 ENCOUNTER — Encounter (HOSPITAL_COMMUNITY): Payer: Self-pay

## 2012-08-20 VITALS — BP 128/86 | HR 90 | Temp 98.3°F | Resp 18 | Ht 69.5 in | Wt 307.0 lb

## 2012-08-20 DIAGNOSIS — Z01818 Encounter for other preprocedural examination: Secondary | ICD-10-CM | POA: Insufficient documentation

## 2012-08-20 DIAGNOSIS — E669 Obesity, unspecified: Secondary | ICD-10-CM

## 2012-08-20 DIAGNOSIS — I10 Essential (primary) hypertension: Secondary | ICD-10-CM | POA: Insufficient documentation

## 2012-08-20 DIAGNOSIS — Z01812 Encounter for preprocedural laboratory examination: Secondary | ICD-10-CM | POA: Insufficient documentation

## 2012-08-20 HISTORY — PX: BREATH TEK H PYLORI: SHX5422

## 2012-08-20 HISTORY — DX: Gastro-esophageal reflux disease without esophagitis: K21.9

## 2012-08-20 LAB — CBC WITH DIFFERENTIAL/PLATELET
Basophils Relative: 0 % (ref 0–1)
Eosinophils Absolute: 0.1 10*3/uL (ref 0.0–0.7)
Hemoglobin: 11.8 g/dL — ABNORMAL LOW (ref 12.0–15.0)
Lymphs Abs: 2.2 10*3/uL (ref 0.7–4.0)
MCH: 23.8 pg — ABNORMAL LOW (ref 26.0–34.0)
Monocytes Relative: 9 % (ref 3–12)
Neutro Abs: 5.8 10*3/uL (ref 1.7–7.7)
Neutrophils Relative %: 66 % (ref 43–77)
Platelets: 399 10*3/uL (ref 150–400)
RBC: 4.96 MIL/uL (ref 3.87–5.11)
WBC: 8.9 10*3/uL (ref 4.0–10.5)

## 2012-08-20 LAB — COMPREHENSIVE METABOLIC PANEL
ALT: 47 U/L — ABNORMAL HIGH (ref 0–35)
Albumin: 3.2 g/dL — ABNORMAL LOW (ref 3.5–5.2)
Alkaline Phosphatase: 101 U/L (ref 39–117)
Chloride: 103 mEq/L (ref 96–112)
Glucose, Bld: 174 mg/dL — ABNORMAL HIGH (ref 70–99)
Potassium: 4.3 mEq/L (ref 3.5–5.1)
Sodium: 136 mEq/L (ref 135–145)
Total Bilirubin: 0.1 mg/dL — ABNORMAL LOW (ref 0.3–1.2)
Total Protein: 7.9 g/dL (ref 6.0–8.3)

## 2012-08-20 LAB — HCG, SERUM, QUALITATIVE: Preg, Serum: NEGATIVE

## 2012-08-20 SURGERY — BREATH TEST, FOR HELICOBACTER PYLORI

## 2012-08-20 NOTE — Patient Instructions (Signed)
20 [place] R Smith  08/20/2012   Your procedure is scheduled on: 08/24/12  Report to [facility] at 7:45 AM.  Call this number if you have problems the morning of surgery [phone]   Remember:   Do not eat food or drink liquids After Midnight.     Do not wear jewelry, make-up or nail polish.  Do not wear lotions, powders, or perfumes. You may wear deodorant.  Do not shave 48 hours prior to surgery. Men may shave face and neck.  Do not bring valuables to the hospital.  Contacts, dentures or bridgework may not be worn into surgery.  Leave suitcase in the car. After surgery it may be brought to your room.  For patients admitted to the hospital, checkout time is 11:00 AM the day of discharge.     Birdie Sons, RN  pre op nurse call if needed [phone]    FAILURE TO FOLLOW THESE INSTRUCTIONS MAY RESULT IN CANCELLATION OF YOUR SURGERY   Patient Signature: ___________________________________________

## 2012-08-23 ENCOUNTER — Encounter (HOSPITAL_COMMUNITY): Payer: Self-pay | Admitting: General Surgery

## 2012-08-24 ENCOUNTER — Inpatient Hospital Stay (HOSPITAL_COMMUNITY): Payer: BC Managed Care – PPO | Admitting: Certified Registered Nurse Anesthetist

## 2012-08-24 ENCOUNTER — Inpatient Hospital Stay (HOSPITAL_COMMUNITY)
Admission: RE | Admit: 2012-08-24 | Discharge: 2012-08-26 | DRG: 288 | Disposition: A | Discharge status: Home / Self Care | Payer: BC Managed Care – PPO | Source: Ambulatory Visit | Attending: General Surgery | Admitting: General Surgery

## 2012-08-24 ENCOUNTER — Encounter (HOSPITAL_COMMUNITY)
Admission: RE | Disposition: A | Discharge status: Home / Self Care | Payer: Self-pay | Source: Ambulatory Visit | Attending: General Surgery

## 2012-08-24 ENCOUNTER — Encounter (HOSPITAL_COMMUNITY): Payer: Self-pay | Admitting: Certified Registered Nurse Anesthetist

## 2012-08-24 ENCOUNTER — Encounter (HOSPITAL_COMMUNITY): Payer: Self-pay

## 2012-08-24 DIAGNOSIS — E119 Type 2 diabetes mellitus without complications: Secondary | ICD-10-CM | POA: Diagnosis present

## 2012-08-24 DIAGNOSIS — E669 Obesity, unspecified: Secondary | ICD-10-CM

## 2012-08-24 DIAGNOSIS — R000 Tachycardia, unspecified: Secondary | ICD-10-CM | POA: Diagnosis not present

## 2012-08-24 DIAGNOSIS — I10 Essential (primary) hypertension: Secondary | ICD-10-CM | POA: Diagnosis present

## 2012-08-24 DIAGNOSIS — Z6841 Body Mass Index (BMI) 40.0 and over, adult: Secondary | ICD-10-CM

## 2012-08-24 DIAGNOSIS — Z79899 Other long term (current) drug therapy: Secondary | ICD-10-CM

## 2012-08-24 HISTORY — PX: GASTRIC ROUX-EN-Y: SHX5262

## 2012-08-24 LAB — GLUCOSE, CAPILLARY
Glucose-Capillary: 192 mg/dL — ABNORMAL HIGH (ref 70–99)
Glucose-Capillary: 222 mg/dL — ABNORMAL HIGH (ref 70–99)
Glucose-Capillary: 254 mg/dL — ABNORMAL HIGH (ref 70–99)
Glucose-Capillary: 257 mg/dL — ABNORMAL HIGH (ref 70–99)

## 2012-08-24 SURGERY — LAPAROSCOPIC ROUX-EN-Y GASTRIC BYPASS WITH UPPER ENDOSCOPY
Anesthesia: General | Wound class: Clean Contaminated

## 2012-08-24 MED ORDER — INSULIN ASPART 100 UNIT/ML ~~LOC~~ SOLN
0.0000 [IU] | SUBCUTANEOUS | Status: DC
  Administered 2012-08-24: 8 [IU] via SUBCUTANEOUS
  Administered 2012-08-24 – 2012-08-25 (×2): via SUBCUTANEOUS
  Administered 2012-08-25: 5 [IU] via SUBCUTANEOUS
  Administered 2012-08-25 (×2): via SUBCUTANEOUS
  Administered 2012-08-25: 3 [IU] via SUBCUTANEOUS
  Administered 2012-08-25 – 2012-08-26 (×4): 2 [IU] via SUBCUTANEOUS

## 2012-08-24 MED ORDER — FENTANYL CITRATE 0.05 MG/ML IJ SOLN
INTRAMUSCULAR | Status: DC | PRN
  Administered 2012-08-24 (×2): 50 ug via INTRAVENOUS
  Administered 2012-08-24: 100 ug via INTRAVENOUS
  Administered 2012-08-24 (×3): 50 ug via INTRAVENOUS

## 2012-08-24 MED ORDER — STERILE WATER FOR IRRIGATION IR SOLN
Status: DC | PRN
  Administered 2012-08-24: 1500 mL

## 2012-08-24 MED ORDER — MORPHINE SULFATE 10 MG/ML IJ SOLN: 2.0000 mg | INTRAMUSCULAR | Status: DC | PRN

## 2012-08-24 MED ORDER — POTASSIUM CHLORIDE IN NACL 20-0.9 MEQ/L-% IV SOLN
INTRAVENOUS | Status: DC
  Administered 2012-08-24 – 2012-08-26 (×4): via INTRAVENOUS
  Filled 2012-08-24 (×8): qty 1000

## 2012-08-24 MED ORDER — HYDROMORPHONE HCL PF 1 MG/ML IJ SOLN
INTRAMUSCULAR | Status: DC | PRN
  Administered 2012-08-24 (×2): 1 mg via INTRAVENOUS

## 2012-08-24 MED ORDER — PROPOFOL 10 MG/ML IV BOLUS
INTRAVENOUS | Status: DC | PRN
  Administered 2012-08-24: 200 mg via INTRAVENOUS

## 2012-08-24 MED ORDER — UNJURY CHOCOLATE CLASSIC POWDER: 2.0000 [oz_av] | Freq: Four times a day (QID) | ORAL | Status: DC

## 2012-08-24 MED ORDER — LIDOCAINE HCL (CARDIAC) 20 MG/ML IV SOLN
INTRAVENOUS | Status: DC | PRN
  Administered 2012-08-24: 100 mg via INTRAVENOUS

## 2012-08-24 MED ORDER — ROCURONIUM BROMIDE 100 MG/10ML IV SOLN
INTRAVENOUS | Status: DC | PRN
  Administered 2012-08-24: 40 mg via INTRAVENOUS
  Administered 2012-08-24 (×3): 10 mg via INTRAVENOUS

## 2012-08-24 MED ORDER — SUCCINYLCHOLINE CHLORIDE 20 MG/ML IJ SOLN
INTRAMUSCULAR | Status: DC | PRN
  Administered 2012-08-24: 100 mg via INTRAVENOUS

## 2012-08-24 MED ORDER — DEXAMETHASONE SODIUM PHOSPHATE 10 MG/ML IJ SOLN
INTRAMUSCULAR | Status: DC | PRN
  Administered 2012-08-24: 10 mg via INTRAVENOUS

## 2012-08-24 MED ORDER — SODIUM CHLORIDE 0.9 % IV SOLN
1.0000 g | Freq: Once | INTRAVENOUS | Status: AC
  Administered 2012-08-24: 1 g via INTRAVENOUS

## 2012-08-24 MED ORDER — OXYCODONE-ACETAMINOPHEN 5-325 MG/5ML PO SOLN
5.0000 mL | ORAL | Status: DC | PRN
  Administered 2012-08-25 (×3): 5 mL via ORAL
  Administered 2012-08-25 – 2012-08-26 (×2): 10 mL via ORAL
  Filled 2012-08-24 (×3): qty 5
  Filled 2012-08-24: qty 10
  Filled 2012-08-24 (×2): qty 5

## 2012-08-24 MED ORDER — METOCLOPRAMIDE HCL 5 MG/ML IJ SOLN
INTRAMUSCULAR | Status: DC | PRN
  Administered 2012-08-24: 10 mg via INTRAVENOUS

## 2012-08-24 MED ORDER — BUPIVACAINE HCL 0.25 % IJ SOLN
INTRAMUSCULAR | Status: DC | PRN
  Administered 2012-08-24: 16.5 mL

## 2012-08-24 MED ORDER — PROMETHAZINE HCL 25 MG/ML IJ SOLN: 6.2500 mg | INTRAMUSCULAR | Status: DC | PRN

## 2012-08-24 MED ORDER — LIDOCAINE-EPINEPHRINE 1 %-1:100000 IJ SOLN
INTRAMUSCULAR | Status: DC | PRN
  Administered 2012-08-24: 16.5 mL

## 2012-08-24 MED ORDER — HYDROMORPHONE HCL PF 1 MG/ML IJ SOLN
0.2500 mg | INTRAMUSCULAR | Status: DC | PRN
  Administered 2012-08-24 (×2): 0.5 mg via INTRAVENOUS

## 2012-08-24 MED ORDER — KETAMINE HCL 10 MG/ML IJ SOLN
INTRAMUSCULAR | Status: DC | PRN
  Administered 2012-08-24: 20 mg via INTRAVENOUS

## 2012-08-24 MED ORDER — NEOSTIGMINE METHYLSULFATE 1 MG/ML IJ SOLN
INTRAMUSCULAR | Status: DC | PRN
  Administered 2012-08-24: 5 mg via INTRAVENOUS

## 2012-08-24 MED ORDER — GLYCOPYRROLATE 0.2 MG/ML IJ SOLN
INTRAMUSCULAR | Status: DC | PRN
  Administered 2012-08-24: 0.6 mg via INTRAVENOUS

## 2012-08-24 MED ORDER — ONDANSETRON HCL 4 MG/2ML IJ SOLN
4.0000 mg | INTRAMUSCULAR | Status: DC | PRN
  Administered 2012-08-24: 4 mg via INTRAVENOUS
  Filled 2012-08-24: qty 2

## 2012-08-24 MED ORDER — UNJURY VANILLA POWDER
2.0000 [oz_av] | Freq: Four times a day (QID) | ORAL | Status: DC
  Administered 2012-08-26: 2 [oz_av] via ORAL

## 2012-08-24 MED ORDER — ACETAMINOPHEN 10 MG/ML IV SOLN
1000.0000 mg | Freq: Once | INTRAVENOUS | Status: AC
  Administered 2012-08-24: 1000 mg via INTRAVENOUS
  Filled 2012-08-24: qty 100

## 2012-08-24 MED ORDER — ONDANSETRON HCL 4 MG/2ML IJ SOLN
INTRAMUSCULAR | Status: DC | PRN
  Administered 2012-08-24: 4 mg via INTRAVENOUS

## 2012-08-24 MED ORDER — LACTATED RINGERS IR SOLN
Status: DC | PRN
  Administered 2012-08-24: 1000 mL

## 2012-08-24 MED ORDER — ESMOLOL HCL 10 MG/ML IV SOLN
INTRAVENOUS | Status: DC | PRN
  Administered 2012-08-24: 20 mg via INTRAVENOUS

## 2012-08-24 MED ORDER — HEPARIN SODIUM (PORCINE) 5000 UNIT/ML IJ SOLN
5000.0000 [IU] | Freq: Once | INTRAMUSCULAR | Status: AC
  Administered 2012-08-24: 5000 [IU] via SUBCUTANEOUS
  Filled 2012-08-24: qty 1

## 2012-08-24 MED ORDER — MORPHINE SULFATE 2 MG/ML IJ SOLN
2.0000 mg | INTRAMUSCULAR | Status: DC | PRN
  Administered 2012-08-24: 4 mg via INTRAVENOUS
  Administered 2012-08-24: 2 mg via INTRAVENOUS
  Administered 2012-08-24 – 2012-08-25 (×2): 4 mg via INTRAVENOUS
  Filled 2012-08-24: qty 2
  Filled 2012-08-24: qty 1
  Filled 2012-08-24: qty 2
  Filled 2012-08-24 (×2): qty 1

## 2012-08-24 MED ORDER — LACTATED RINGERS IV SOLN
INTRAVENOUS | Status: DC
  Administered 2012-08-24 (×2): 1000 mL via INTRAVENOUS
  Administered 2012-08-24: 12:00:00 via INTRAVENOUS

## 2012-08-24 MED ORDER — MIDAZOLAM HCL 5 MG/5ML IJ SOLN
INTRAMUSCULAR | Status: DC | PRN
  Administered 2012-08-24: 2 mg via INTRAVENOUS

## 2012-08-24 MED ORDER — 0.9 % SODIUM CHLORIDE (POUR BTL) OPTIME
TOPICAL | Status: DC | PRN
  Administered 2012-08-24: 1000 mL

## 2012-08-24 MED ORDER — ENOXAPARIN SODIUM 40 MG/0.4ML ~~LOC~~ SOLN
40.0000 mg | Freq: Two times a day (BID) | SUBCUTANEOUS | Status: DC
  Administered 2012-08-25 – 2012-08-26 (×3): 40 mg via SUBCUTANEOUS
  Filled 2012-08-24 (×6): qty 0.4

## 2012-08-24 MED ORDER — UNJURY CHICKEN SOUP POWDER: 2.0000 [oz_av] | Freq: Four times a day (QID) | ORAL | Status: DC

## 2012-08-24 SURGICAL SUPPLY — 77 items
ADH SKN CLS APL DERMABOND .7 (GAUZE/BANDAGES/DRESSINGS) ×1
APL SRG 32X5 SNPLK LF DISP (MISCELLANEOUS)
APPLIER CLIP ROT 13.4 12 LRG (CLIP)
APPLIER CLIP UNV 5X34 EPIX (ENDOMECHANICALS) IMPLANT
APR CLP LRG 13.4X12 ROT 20 MLT (CLIP)
APR XCLPCLP 20M/L UNV 34X5 (ENDOMECHANICALS)
BAG SPEC RTRVL LRG 6X4 10 (ENDOMECHANICALS) ×1
BLADE SURG 15 STRL LF DISP TIS (BLADE) ×1 IMPLANT
BLADE SURG 15 STRL SS (BLADE) ×2
CABLE HIGH FREQUENCY MONO STRZ (ELECTRODE) ×2 IMPLANT
CANISTER SUCTION 2500CC (MISCELLANEOUS) ×2 IMPLANT
CHLORAPREP W/TINT 26ML (MISCELLANEOUS) ×4 IMPLANT
CLIP APPLIE ROT 13.4 12 LRG (CLIP) IMPLANT
CLOTH BEACON ORANGE TIMEOUT ST (SAFETY) ×2 IMPLANT
COVER PROBE U/S 5X48 (MISCELLANEOUS) ×2 IMPLANT
COVER SURGICAL LIGHT HANDLE (MISCELLANEOUS) ×2 IMPLANT
DERMABOND ADVANCED (GAUZE/BANDAGES/DRESSINGS) ×1
DERMABOND ADVANCED .7 DNX12 (GAUZE/BANDAGES/DRESSINGS) IMPLANT
DEVICE SUTURE ENDOST 10MM (ENDOMECHANICALS) ×2 IMPLANT
DISSECTOR BLUNT TIP ENDO 5MM (MISCELLANEOUS) IMPLANT
DRAIN CHANNEL 19F RND (DRAIN) ×2 IMPLANT
DRAPE CAMERA CLOSED 9X96 (DRAPES) ×2 IMPLANT
EVACUATOR DRAINAGE 10X20 100CC (DRAIN) ×1 IMPLANT
EVACUATOR SILICONE 100CC (DRAIN) ×2
GAUZE SPONGE 4X4 16PLY XRAY LF (GAUZE/BANDAGES/DRESSINGS) ×2 IMPLANT
GLOVE SURG SS PI 7.5 STRL IVOR (GLOVE) ×4 IMPLANT
GOWN STRL NON-REIN LRG LVL3 (GOWN DISPOSABLE) ×2 IMPLANT
GOWN STRL REIN XL XLG (GOWN DISPOSABLE) ×6 IMPLANT
HOVERMATT SINGLE USE (MISCELLANEOUS) ×2 IMPLANT
KIT BASIN OR (CUSTOM PROCEDURE TRAY) ×2 IMPLANT
KIT GASTRIC LAVAGE 34FR ADT (SET/KITS/TRAYS/PACK) ×2 IMPLANT
MARKER SKIN DUAL TIP RULER LAB (MISCELLANEOUS) ×2 IMPLANT
NDL SPNL 22GX3.5 QUINCKE BK (NEEDLE) ×1 IMPLANT
NEEDLE SPNL 22GX3.5 QUINCKE BK (NEEDLE) ×2 IMPLANT
NS IRRIG 1000ML POUR BTL (IV SOLUTION) ×2 IMPLANT
PACK CARDIOVASCULAR III (CUSTOM PROCEDURE TRAY) ×2 IMPLANT
PENCIL BUTTON HOLSTER BLD 10FT (ELECTRODE) ×2 IMPLANT
POUCH SPECIMEN RETRIEVAL 10MM (ENDOMECHANICALS) ×2 IMPLANT
RELOAD BLUE (STAPLE) ×3 IMPLANT
RELOAD ENDO STITCH (ENDOMECHANICALS) ×10 IMPLANT
RELOAD ENDO STITCH 2.0 (ENDOMECHANICALS)
RELOAD GOLD (STAPLE) ×6 IMPLANT
RELOAD SUT SNGL STCH ABSRB 2-0 (ENDOMECHANICALS) IMPLANT
RELOAD SUT SNGL STCH BLK 2-0 (ENDOMECHANICALS) IMPLANT
RELOAD SUT TRIPLE-STITCH 2-0 (ENDOMECHANICALS) ×3 IMPLANT
RELOAD WHITE ECR60W (STAPLE) ×3 IMPLANT
SCISSORS LAP 5X45 EPIX DISP (ENDOMECHANICALS) ×2 IMPLANT
SEALANT SURGICAL APPL DUAL CAN (MISCELLANEOUS) IMPLANT
SET IRRIG TUBING LAPAROSCOPIC (IRRIGATION / IRRIGATOR) ×2 IMPLANT
SHEARS CURVED HARMONIC AC 45CM (MISCELLANEOUS) ×2 IMPLANT
SOLUTION ANTI FOG 6CC (MISCELLANEOUS) ×2 IMPLANT
SPONGE GAUZE 4X4 12PLY (GAUZE/BANDAGES/DRESSINGS) IMPLANT
STAPLE ECHEON FLEX 60 POW ENDO (STAPLE) ×2 IMPLANT
STAPLER CIRC 21 3.5 SULU (STAPLE) ×2 IMPLANT
STAPLER TRANS-ORAL 21MM DST (STAPLE) ×2 IMPLANT
STAPLER VISISTAT 35W (STAPLE) ×2 IMPLANT
STRIP CLOSURE SKIN 1/2X4 (GAUZE/BANDAGES/DRESSINGS) IMPLANT
STRIP PERI DRY ECHOLON STPL (ENDOMECHANICALS) ×2 IMPLANT
STRIP PERI DRY VERITAS 60 (STAPLE) ×6 IMPLANT
SUT ETHILON 3 0 PS 1 (SUTURE) ×2 IMPLANT
SUT MNCRL AB 4-0 PS2 18 (SUTURE) ×2 IMPLANT
SUT RELOAD ENDO STITCH 2 48X1 (ENDOMECHANICALS)
SUT RELOAD ENDO STITCH 2.0 (ENDOMECHANICALS)
SUT VICRYL 0 UR6 27IN ABS (SUTURE) ×4 IMPLANT
SUTURE RELOAD END STTCH 2 48X1 (ENDOMECHANICALS) IMPLANT
SUTURE RELOAD ENDO STITCH 2.0 (ENDOMECHANICALS) IMPLANT
SYR 20CC LL (SYRINGE) ×2 IMPLANT
SYR 50ML LL SCALE MARK (SYRINGE) ×2 IMPLANT
TOWEL OR 17X26 10 PK STRL BLUE (TOWEL DISPOSABLE) ×4 IMPLANT
TRAY FOLEY CATH 14FRSI W/METER (CATHETERS) ×2 IMPLANT
TROCAR BLADELESS 15MM (ENDOMECHANICALS) ×1 IMPLANT
TROCAR XCEL BLADELESS 5X75MML (TROCAR) ×2 IMPLANT
TROCAR XCEL NON-BLD 5MMX100MML (ENDOMECHANICALS) ×6 IMPLANT
TROCAR Z-THREAD BLADED 5X100MM (TROCAR) ×2 IMPLANT
TUBING CONNECTING 10 (TUBING) ×2 IMPLANT
TUBING ENDO SMARTCAP (MISCELLANEOUS) ×2 IMPLANT
TUBING FILTER THERMOFLATOR (ELECTROSURGICAL) IMPLANT

## 2012-08-24 NOTE — Brief Op Note (Signed)
08/24/2012  2:35 PM  PATIENT:  [name] [name]  32 y.o. female  PRE-OPERATIVE DIAGNOSIS:  morbid obesity   POST-OPERATIVE DIAGNOSIS:  morbid obesity   PROCEDURE:  Procedure(s): LAPAROSCOPIC ROUX-EN-Y GASTRIC BYPASS WITH UPPER ENDOSCOPY (N/A)  SURGEON:  Surgeon(s) and Role:    * Lodema Pilot, DO - Primary    * Mariella Saa, MD - Assisting  PHYSICIAN ASSISTANT:   ASSISTANTS: [place]   ANESTHESIA:   general  EBL:  Total I/O In: 2000 [I.V.:2000] Out: 250 [Urine:150; Blood:100]  BLOOD ADMINISTERED:none  DRAINS: (62f) Jackson-Pratt drain(s) with closed bulb suction in the GJ anastamosis   LOCAL MEDICATIONS USED:  MARCAINE    and LIDOCAINE   SPECIMEN:  No Specimen  DISPOSITION OF SPECIMEN:  N/A  COUNTS:  YES  TOURNIQUET:  * No tourniquets in log *  DICTATION: .Other Dictation: Dictation Number [id number]  PLAN OF CARE: Admit to inpatient   PATIENT DISPOSITION:  PACU - hemodynamically stable.   Delay start of Pharmacological VTE agent (>24hrs) due to surgical blood loss or risk of bleeding: no

## 2012-08-24 NOTE — Preoperative (Signed)
Beta Blockers   Reason not to administer Beta Blockers:Not Applicable 

## 2012-08-24 NOTE — Anesthesia Preprocedure Evaluation (Signed)
Anesthesia Evaluation  Patient identified by MRN, date of birth, ID band Patient awake    Reviewed: Allergy & Precautions, H&P , NPO status , Patient's Chart, lab work & pertinent test results  Airway Mallampati: II TM Distance: >3 FB Neck ROM: Full    Dental no notable dental hx.    Pulmonary neg pulmonary ROS,  breath sounds clear to auscultation  Pulmonary exam normal       Cardiovascular Exercise Tolerance: Good hypertension, Pt. on medications negative cardio ROS  Rhythm:Regular Rate:Normal     Neuro/Psych negative neurological ROS  negative psych ROS   GI/Hepatic negative GI ROS, Neg liver ROS,   Endo/Other  diabetes, Type 2, Oral Hypoglycemic AgentsMorbid obesity  Renal/GU negative Renal ROS  negative genitourinary   Musculoskeletal negative musculoskeletal ROS (+)   Abdominal (+) + obese,   Peds negative pediatric ROS (+)  Hematology negative hematology ROS (+)   Anesthesia Other Findings   Reproductive/Obstetrics negative OB ROS                           Anesthesia Physical Anesthesia Plan  ASA: III  Anesthesia Plan: General   Post-op Pain Management:    Induction: Intravenous  Airway Management Planned: Oral ETT  Additional Equipment:   Intra-op Plan:   Post-operative Plan: Extubation in OR  Informed Consent: I have reviewed the patients History and Physical, chart, labs and discussed the procedure including the risks, benefits and alternatives for the proposed anesthesia with the patient or authorized representative who has indicated his/her understanding and acceptance.   Dental advisory given  Plan Discussed with: CRNA  Anesthesia Plan Comments:         Anesthesia Quick Evaluation

## 2012-08-24 NOTE — Transfer of Care (Signed)
Immediate Anesthesia Transfer of Care Note  Patient: [name] [name]  Procedure(s) Performed: Procedure(s): LAPAROSCOPIC ROUX-EN-Y GASTRIC BYPASS WITH UPPER ENDOSCOPY (N/A)  Patient Location: PACU  Anesthesia Type:General  Level of Consciousness: awake, alert  and patient cooperative  Airway & Oxygen Therapy: Patient Spontanous Breathing and Patient connected to face mask oxygen  Post-op Assessment: Report given to PACU RN and Post -op Vital signs reviewed and stable  Post vital signs: stable  Complications: No apparent anesthesia complications

## 2012-08-24 NOTE — Op Note (Signed)
Upper GI endoscopy is performed at the completion of laparoscopic Roux-en-Y gastric bypass by Dr. Biagio Quint. The Olympus video endoscope was inserted into the upper esophagus and then passed under direct vision to the EG junction. The small gastric pouch was insufflated with air while the gastric outlet was clamped under irrigation by the operating surgeon. There was no evidence of leak. The anastomosis was visualized and was patent. Suture and staple lines were intact and without bleeding. The pouch was tubular and measured 4-5 cm in length. At the completion of the procedure the pouch was desufflated and the scope withdrawn.

## 2012-08-24 NOTE — Anesthesia Postprocedure Evaluation (Signed)
  Anesthesia Post-op Note  Patient: [name] [name]  Procedure(s) Performed: Procedure(s) (LRB): LAPAROSCOPIC ROUX-EN-Y GASTRIC BYPASS WITH UPPER ENDOSCOPY (N/A)  Patient Location: PACU  Anesthesia Type: General  Level of Consciousness: awake and alert   Airway and Oxygen Therapy: Patient Spontanous Breathing  Post-op Pain: mild  Post-op Assessment: Post-op Vital signs reviewed, Patient's Cardiovascular Status Stable, Respiratory Function Stable, Patent Airway and No signs of Nausea or vomiting  Last Vitals:  Filed Vitals:   08/24/12 1630  BP: 135/80  Pulse: 92  Temp: 36.6 C  Resp: 18    Post-op Vital Signs: stable   Complications: No apparent anesthesia complications

## 2012-08-24 NOTE — H&P (View-Only) (Signed)
Patient ID: [name] [name], female   DOB: Dec 08, 1980, 32 y.o.   MRN: [id number]  Chief Complaint  Patient presents with  . Bariatric Pre-op    HPI [name] [name] is a 32 y.o. female. This patient presents for her preoperative surgery evaluation in preparation for Roux-en-Y gastric bypass. She has a BMI of 45.9 with obesity related comorbidities of hypertension and diabetes mellitus. I saw her initially and at that time she was most interested in the lap band. She was required to wait 6 months for her physician supervised weight loss by her insurance company and over that time she says that she has done some additional research and talked with her endocrinologist and she now feels that gastric bypass would be the best option for her. This is mainly for the best control of her diabetes. She was H. Pylori positive and she has completed her triple therapy and is scheduled for her breath tek. Otherwise she has completed her nutrition and psychology evaluation and her upper GI which was normal. HPI  Past Medical History  Diagnosis Date  . Diabetes mellitus 05/2008    DR. BALAN  . Obesity   . Hypertension     History reviewed. No pertinent past surgical history.  Family History  Problem Relation Age of Onset  . Diabetes Mother   . Hypertension Mother   . Diabetes Maternal Aunt   . Diabetes Maternal Grandfather     Social History History  Substance Use Topics  . Smoking status: Never Smoker   . Smokeless tobacco: Never Used  . Alcohol Use: No    Allergies  Allergen Reactions  . Metformin And Related Itching    Current Outpatient Prescriptions  Medication Sig Dispense Refill  . amoxicillin-clarithromycin-lansoprazole (PREVPAC) combo pack Take by mouth 2 (two) times daily. Follow package directions.  1 kit  0  . glyBURIDE (DIABETA) 2.5 MG tablet Take 1.25 mg by mouth 2 (two) times daily with a meal.      . lisinopril (PRINIVIL,ZESTRIL) 5 MG tablet Take 5 mg by mouth daily.      .  Multiple Vitamins-Minerals (MULTIVITAMIN GUMMIES ADULT PO) Take 2 each by mouth daily.       No current facility-administered medications for this visit.    Review of Systems Review of Systems All other review of systems negative or noncontributory except as stated in the HPI  Blood pressure 132/80, pulse 72, temperature 97.8 F (36.6 C), temperature source Temporal, resp. rate 15, height 5\' 9"  (1.753 m), weight 311 lb 3.2 oz (141.159 kg).  Physical Exam Physical Exam Physical Exam  Nursing note and vitals reviewed. Constitutional: She is oriented to person, place, and time. She appears well-developed and well-nourished. No distress.  HENT:  Head: Normocephalic and atraumatic.  Mouth/Throat: No oropharyngeal exudate.  Eyes: Conjunctivae and EOM are normal. Pupils are equal, round, and reactive to light. Right eye exhibits no discharge. Left eye exhibits no discharge. No scleral icterus.  Neck: Normal range of motion. Neck supple. No tracheal deviation present.  Cardiovascular: Normal rate, regular rhythm, normal heart sounds and intact distal pulses.   Pulmonary/Chest: Effort normal and breath sounds normal. No stridor. No respiratory distress. She has no wheezes.  Abdominal: Soft. Bowel sounds are normal. She exhibits no distension and no mass. There is no tenderness. There is no rebound and no guarding.  Musculoskeletal: Normal range of motion. She exhibits no edema and no tenderness.  Neurological: She is alert and oriented to person, place, and time.  Skin: Skin is warm and dry. No rash noted. She is not diaphoretic. No erythema. No pallor.  Psychiatric: She has a normal mood and affect. Her behavior is normal. Judgment and thought content normal.    Data Reviewed   Assessment    Morbid obesity with a BMI of 45.9 and obesity related comorbidities of hypertension and diabetes mellitus We again discussed the options for lap band, vertical sleeve Gastrectomy, and Roux-en-Y  gastric bypass and she is interested in the gastric bypass. She is a fine candidate for any of the procedures.  We discussed the pros and cons of procedure and the risks of the procedure.The risks of infection, bleeding, pain, scarring, weight regain, too little or too much weight loss, vitamin deficiencies and need for lifelong vitamin supplementation, hair loss, need for protein supplementation, leaks, stricture, reflux, food intolerance, need for reoperation , need for open surgery, injury to spleen or surrounding structures, DVT's, PE, and death again discussed with the patient and the patient expressed understanding and desires to proceed with laparoscopic RYGB, possible open, intraoperative endoscopy.  We discussed the perioperative course as well as the long-term complications such as internal hernia and dumping syndrome vitamin deficiencies. She remains interested in the Roux-en-Y gastric bypass mainly for best control of her diabetes.      Plan    She will start on her preoperative diet today of and we will obtain BreathTek analysis to insure eradication of her H. Pylori and we will plan for Roux-en-Y gastric bypass as scheduled        Stokes Rattigan DAVID 08/12/2012, 12:37 PM

## 2012-08-24 NOTE — Interval H&P Note (Signed)
History and Physical Interval Note:  08/24/2012 10:03 AM  [place] R Smith  has presented today for surgery, with the diagnosis of morbid obesity   The various methods of treatment have been discussed with the patient and family. After consideration of risks, benefits and other options for treatment, the patient has consented to  Procedure(s): LAPAROSCOPIC ROUX-EN-Y GASTRIC BYPASS WITH UPPER ENDOSCOPY (N/A) as a surgical intervention .  The patient's history has been reviewed, patient examined, no change in status, stable for surgery.  I have reviewed the patient's chart and labs.  Questions were answered to the patient's satisfaction.  I have seen and evaluated the patient.  H. Pylori negative.  She remains interested in the RYGB.  I again discussed with her the risks of the procedure.  The risks of infection, bleeding, pain, scarring, weight regain, too little or too much weight loss, vitamin deficiencies and need for lifelong vitamin supplementation, hair loss, need for protein supplementation, leaks, stricture, reflux, food intolerance, need for reoperation , need for open surgery, injury to spleen or surrounding structures, DVT's, PE, and death again discussed with the patient and the patient expressed understanding and desires to proceed with laparoscopic RYGB, possible open, intraoperative endoscopy.    Lodema Pilot DAVID

## 2012-08-25 ENCOUNTER — Encounter (HOSPITAL_COMMUNITY): Payer: Self-pay | Admitting: General Surgery

## 2012-08-25 LAB — GLUCOSE, CAPILLARY
Glucose-Capillary: 181 mg/dL — ABNORMAL HIGH (ref 70–99)
Glucose-Capillary: 153 mg/dL — ABNORMAL HIGH (ref 70–99)
Glucose-Capillary: 121 mg/dL — ABNORMAL HIGH (ref 70–99)

## 2012-08-25 LAB — COMPREHENSIVE METABOLIC PANEL
Albumin: 2.8 g/dL — ABNORMAL LOW (ref 3.5–5.2)
Alkaline Phosphatase: 85 U/L (ref 39–117)
BUN: 6 mg/dL (ref 6–23)
Potassium: 4 mEq/L (ref 3.5–5.1)
Total Protein: 7.4 g/dL (ref 6.0–8.3)

## 2012-08-25 LAB — CBC WITH DIFFERENTIAL/PLATELET
Basophils Relative: 0 % (ref 0–1)
Eosinophils Absolute: 0 10*3/uL (ref 0.0–0.7)
Hemoglobin: 11.3 g/dL — ABNORMAL LOW (ref 12.0–15.0)
MCH: 24.1 pg — ABNORMAL LOW (ref 26.0–34.0)
MCHC: 32 g/dL (ref 30.0–36.0)
Monocytes Relative: 5 % (ref 3–12)
Neutrophils Relative %: 91 % — ABNORMAL HIGH (ref 43–77)
Platelets: 357 10*3/uL (ref 150–400)
RDW: 16.3 % — ABNORMAL HIGH (ref 11.5–15.5)

## 2012-08-25 MED ORDER — PNEUMOCOCCAL VAC POLYVALENT 25 MCG/0.5ML IJ INJ
0.5000 mL | INJECTION | INTRAMUSCULAR | Status: AC
  Administered 2012-08-26: 0.5 mL via INTRAMUSCULAR
  Filled 2012-08-25 (×2): qty 0.5

## 2012-08-25 NOTE — Op Note (Signed)
NAME:  [name], [name]                 ACCOUNT NO.:  [id number]  MEDICAL RECORD NO.:  [id number]  LOCATION:  1529                         FACILITY:  [facility]  PHYSICIAN:  Lodema Pilot, MD       DATE OF BIRTH:  1980/10/02  DATE OF PROCEDURE:  08/24/2012 DATE OF DISCHARGE:                              OPERATIVE REPORT   PROCEDURE:  Laparoscopic Roux-en-Y gastric bypass with intraoperative endoscopy.  PREOPERATIVE DIAGNOSES:  Morbid obesity and diabetes and hypertension.  POSTOPERATIVE DIAGNOSES:  Morbid obesity and diabetes and hypertension.  SURGEON:  Lodema Pilot, MD  ASSISTANT:  Lorne Skeens. Hoxworth, M.D.  ANESTHESIA:  General endotracheal tube anesthesia with 33 mL of 1% lidocaine with epinephrine and 0.25% Marcaine in a 50:50 mixture.  FLUIDS:  2 L of crystalloid.  ESTIMATED BLOOD LOSS:  100 mL.  DRAINS:  A 19-French Blake drain placed posterior to the gastrojejunal anastomosis.  SPECIMENS:  None.  COMPLICATIONS:  None apparent.  FINDINGS:  Circular stapled gastrojejunal anastomosis with 21 mm EEA stapler.  A 19-French Blake drain placed posterior to this anastomosis. Anti-colic, anti-gastric Roux limb measuring 100 cm, 50 cm biliopancreatic limb.  Jejunal defect closed.  INDICATION FOR PROCEDURE:  [name] [name] is a 32 year old female with a BMI of 46 and obesity-related comorbidities of hypertension and diabetes mellitus.  She has failed medical weight loss management and desires better control of her weight and diabetes.  OPERATIVE DETAILS:  [name] [name] was seen and evaluated in the preoperative area and risks and benefits of procedure were again discussed in lay terms.  Informed consent was obtained.  She was given prophylactic antibiotics and prophylactic Keppra and she was then taken to the operating room, placed on table in a supine position.  General endotracheal tube anesthesia was obtained and Foley catheter was placed. Her abdomen was prepped and draped in a  standard surgical fashion. Procedure time-out was performed with all operative team members to confirm proper patient and procedure, and 5-mm Optiview trocar was used to access the peritoneum in the left upper quadrant on the first attempt.  Pneumoperitoneum was obtained.  Laparoscope was introduced. There was no evidence of bleeding or bowel injury upon entry.  A 5 mm left rectus port was placed under direct visualization.  A 15-mm right rectus port and a 5 mm right upper quadrant port were placed under direct visualization.  The omentum was retracted up over the liver and stomach and the ligament of Treitz was identified that measured about 50 cm from the ligament of Treitz and ensured that this reached the diaphragm without tension and then divided the bowel 50 cm from the ligament of Treitz with Endo-GIA white 60 mm stapler.  Suture was placed on the Roux limb marking this limb and divided the mesentery down to its base using Harmonic scalpel taking care to avoid undermining of the blood flow to either limb.  I then measured out 100 cm on the Roux limb and enterotomy was placed on the mesenteric surface of the bowel at 100 cm as well as on the biliopancreatic limb.  A side-to-side stapled anastomosis was created with a 60 mm white load staple load and it  appeared hemostatic internally.  I placed 3 full-thickness sutures across the enterotomy and these sutures were used to align the bowel and a blue 60 mm staple load was used to close the common enterotomy transversely.  Then, a crotch stitch was placed and the alignment stitch was placed aligning the Roux limb with the biliopancreatic limb.  Next, I placed another stitch aligning them and closing the defect left between the anastomosis and this alignment stitch.  Then, the jejunal defect was approximated with 2-0 Surgidac suture in standard fashion and it has been tied to my alignment stitch and I divided the omentum up to the  transverse colon.  Nathanson liver retractor was passed through a separate stab incision in the epigastrium retracting the left lobe of liver and the Roux limb appeared to reach without tension.  I then opened up the pars flaccida entering the lesser sac.  I measured out 5 cm from the GE junction and the lesser curve fat and vasculature was divided with a white staple load transversely up to the stomach with the white staple load covered with Peri-Strips, taking care not to devascularize the expected pouch.  Another transverse firing of the gold staple load, which was not covered with Peri-Strips was taken transversely 5 cm from the GE junction.  I was able to visualize very well posteriorly to the stump and created a window just cephalad to the short gastric vessels at the angle of His and subsequent firings of a gold 60 mm staple loads covered with Peri-Strips were used to create the lateral wall of the pouch until the stomach was completely transected. The staple line of the remnant stomach was inspected for hemostasis and hemostasis obtained with Bovie electrocautery.  The staple line along the pouch was hemostatic.  Then I made a small gastrotomy on the staple line in the distal pouch the uncovered portion of staple lines and a 21 mm x 3.5 mm OrVil anvil was passed through the mouth ending out through this hole on the first attempt suture was cut and the tubing was removed and thrown away, I changed gloves and then placed 2-0 Surgidac Lembert sutures on each side of the anvil tightening the stomach around the anvil.  Then on the distal end of the Roux limb, I made an enterotomy and enlarged my 15-mm trocar site and fascia in order to accommodate a 21-mm EEA stapler.  This was passed through this enterotomy and the spike was exited on the antimesenteric surface of the Roux limb and this was mated with the anvil and the anastomosis was created.  The donuts were inspected on the back  table and noted to be circumferential.  The candy-cane portion of the Roux limb was undermined slightly and the enterotomy and the Roux limb would closed by transecting the portion of the candy-cane up to the anastomosis.  Then anti-tension stitches were placed on each side of the anastomosis with a 2-0 Surgidac sutures and I clamped the Roux limb and upper endoscopy was performed by Dr. Johna Sheriff, which demonstrated open anastomosis easily able to be navigated with a scope and no evidence of air leak submerged under water.  The pouch measured 5 cm endoscopically.  Endoscope was removed and I suctioned the fluid from the abdomen.  A 19-French Blake drain was passed into the abdomen and placed just posterior to the gastric jejunal anastomosis and exited through the left upper quadrant trocar site and sutured in place with a nylon drain stitches.  The retractor  was removed.  The jejunal anastomosis was investigated and there was no evidence of bleeding or any leakage of enteric contents.  The resected portion of the candy-cane was placed in EndoCatch bag and removed from the abdomen, but not sent to Pathology.  The fascia at the stomach extraction site was approximated with interrupted 0-Vicryl sutures in an open fashion. Sutures were secured and the abdomen was re-insufflated with carbon dioxide gas.  Abdominal wall closure was noted to be adequate without any evidence of bleeding or bowel injury.  The wound was irrigated with sterile saline solution.  The wounds were injected with total of 33 mL of 1% lidocaine with epinephrine and 0.25% Marcaine in a 50:50 mixture.  Skin edges were approximated with 4-0 Monocryl subcuticular suture.  Skin was washed and dried and Dermabond was applied.  All sponge, needle, and instrument counts were correct at the end of the case.  The patient tolerated the procedure well without apparent complications.          ______________________________ Lodema Pilot, MD     BL/MEDQ  D:  08/24/2012  T:  08/25/2012  Job:  [id number]

## 2012-08-25 NOTE — Progress Notes (Signed)
1 Day Post-Op  Subjective: No complaints this am.  No nausea  Objective: Vital signs in last 24 hours: Temp:  [97.6 F (36.4 C)-98.8 F (37.1 C)] 98.7 F (37.1 C) (08/06 0534) Pulse Rate:  [90-105] 100 (08/06 0534) Resp:  [16-23] 16 (08/06 0534) BP: (125-160)/(65-88) 133/75 mmHg (08/06 0534) SpO2:  [98 %-100 %] 100 % (08/06 0534) Weight:  [300 lb 8 oz (136.306 kg)] 300 lb 8 oz (136.306 kg) (08/05 0746) Last BM Date: 08/24/12  Intake/Output from previous day: 08/05 0701 - 08/06 0700 In: 3716.7 [I.V.:3716.7] Out: 2815 [Urine:2500; Drains:215; Blood:100] Intake/Output this shift:    General appearance: alert, cooperative and no distress Resp: clear to auscultation bilaterally Cardio: HR 105, regular GI: soft, mild incisional tenderness, ND, wounds look fine, no peritoneal signs, JP ss Extremities: SCD's bilat  Lab Results:   Recent Labs  08/25/12 0351  WBC 20.3*  HGB 11.3*  HCT 35.3*  PLT 357   BMET  Recent Labs  08/25/12 0351  NA 137  K 4.0  CL 102  CO2 27  GLUCOSE 234*  BUN 6  CREATININE 0.84  CALCIUM 8.8   PT/INR No results found for this basename: LABPROT, INR,  in the last 72 hours ABG No results found for this basename: PHART, PCO2, PO2, HCO3,  in the last 72 hours  Studies/Results: No results found.  Anti-infectives: Anti-infectives   Start     Dose/Rate Route Frequency Ordered Stop   08/24/12 0800  ertapenem (INVANZ) 1 g in sodium chloride 0.9 % 50 mL IVPB     1 g 100 mL/hr over 30 Minutes Intravenous  Once 08/24/12 0748 08/24/12 1115      Assessment/Plan: s/p Procedure(s): LAPAROSCOPIC ROUX-EN-Y GASTRIC BYPASS WITH UPPER ENDOSCOPY (N/A) mild tachycardia but she looks okay.  abdomen with only mild incisional tenderness, will try liquids today and see how she does.    LOS: 1 day    Lodema Pilot DAVID 08/25/2012

## 2012-08-26 LAB — GLUCOSE, CAPILLARY
Glucose-Capillary: 123 mg/dL — ABNORMAL HIGH (ref 70–99)
Glucose-Capillary: 124 mg/dL — ABNORMAL HIGH (ref 70–99)
Glucose-Capillary: 127 mg/dL — ABNORMAL HIGH (ref 70–99)

## 2012-08-26 LAB — CBC WITH DIFFERENTIAL/PLATELET
Basophils Absolute: 0 10*3/uL (ref 0.0–0.1)
Lymphocytes Relative: 24 % (ref 12–46)
Neutro Abs: 8.4 10*3/uL — ABNORMAL HIGH (ref 1.7–7.7)
Platelets: 285 10*3/uL (ref 150–400)
RBC: 4.15 MIL/uL (ref 3.87–5.11)
RDW: 16.7 % — ABNORMAL HIGH (ref 11.5–15.5)
WBC: 12.4 10*3/uL — ABNORMAL HIGH (ref 4.0–10.5)

## 2012-08-26 MED ORDER — PANTOPRAZOLE SODIUM 40 MG PO PACK: 40.0000 mg | PACK | Freq: Every day | ORAL | Status: DC

## 2012-08-26 MED ORDER — OXYCODONE-ACETAMINOPHEN 5-325 MG/5ML PO SOLN: 7.5000 mL | ORAL | Status: DC | PRN

## 2012-08-26 MED ORDER — URSODIOL 300 MG PO CAPS: 300.0000 mg | ORAL_CAPSULE | Freq: Two times a day (BID) | ORAL | Status: DC

## 2012-08-26 MED ORDER — ONDANSETRON 4 MG PO TBDP: 4.0000 mg | ORAL_TABLET | Freq: Three times a day (TID) | ORAL | Status: DC | PRN

## 2012-08-26 NOTE — Progress Notes (Signed)
Patient alert and oriented, pain is controlled. Patient is tolerating fluids, plan to advance to protein shake today.  Reviewed Gastric Bypass discharge instructions with patient and patient is able to articulate understanding.  GASTRIC BYPASS / SLEEVE  Home Care Instructions  These instructions are to help you care for yourself when you go home.  Call: If you have any problems.   Call [phone] and ask for the surgeon on call   If you need immediate assistance come to the ER at [place]. Tell the ER staff that you are a new post-op gastric bypass or gastric sleeve patient   Signs and symptoms to report:   Severe vomiting or nausea o If you cannot handle clear liquids for longer than 1 day, call your surgeon    Abdominal pain which does not get better after taking your pain medication   Fever greater than 100.4 F and chills   Heart rate over 100 beats a minute   Trouble breathing   Chest pain    Redness, swelling, drainage, or foul odor at incision (surgical) sites    If your incisions open or pull apart   Swelling or pain in calf (lower leg)   Diarrhea (Loose bowel movements that happen often), frequent watery, uncontrolled bowel movements   Constipation, (no bowel movements for 3 days) if this happens:  o Take Milk of Magnesia, 2 tablespoons by mouth, 3 times a day for 2 days if needed o Stop taking Milk of Magnesia once you have had a bowel movement o Call your doctor if constipation continues Or o Take Miralax  (instead of Milk of Magnesia) following the label instructions o Stop taking Miralax once you have had a bowel movement o Call your doctor if constipation continues   Anything you think is "abnormal for you"   Normal side effects after surgery:   Unable to sleep at night or unable to concentrate   Irritability   Being tearful (crying) or depressed These are common complaints, possibly related to your anesthesia, stress of surgery and change in lifestyle, that  usually go away a few weeks after surgery.  If these feelings continue, call your medical doctor.  Wound Care: You may have surgical glue, steri-strips, or staples over your incisions after surgery   Surgical glue:  Looks like a clear film over your incisions and will wear off a little at a time   Steri-strips : Adhesive strips of tape over your incisions. You may notice a yellowish color on the skin under the steri-strips. This is used to make the   steri-strips stick better. Do not pull the steri-strips off - let them fall off   Staples: Staples may be removed before you leave the hospital o If you go home with staples, call [facility] at for an appointment with your surgeon's nurse to have staples removed 10 days after surgery, [phone]   Showering: You may shower two (2) days after your surgery unless your surgeon tells you differently o Wash gently around incisions with warm soapy water, rinse well, and gently pat dry  o If you have a drain (tube from your incision), you may need someone to hold this while you shower  o No tub baths until staples are removed and incisions are healed     Medications:   Medications should be liquid or crushed if larger than the size of a dime   Extended release pills (medication that releases a little bit at a time   through the day) should not be crushed   Depending on the size and number of medications you take, you may need to space (take a few throughout the day)/change the time you take your medications so that you do not over-fill your pouch (smaller stomach)   Make sure you follow-up with your primary care physician to make medication changes needed during rapid weight loss and life-style changes   If you have diabetes, follow up with the doctor that orders your diabetes medication(s) within one week after surgery and check your blood sugar regularly.   Do not drive while taking narcotics (pain medications)   Do not take acetaminophen  (Tylenol) and Roxicet or Lortab Elixir at the same time since these pain medications contain acetaminophen  Diet:                    First 2 Weeks  You will see the nutritionist about two (2) weeks after your surgery. The nutritionist will increase the types of foods you can eat if you are handling liquids well:   If you have severe vomiting or nausea and cannot handle clear liquids lasting longer than 1 day, call your surgeon  Protein Shake   Drink at least 2 ounces of shake 5-6 times per day   Each serving of protein shakes (usually 8 - 12 ounces) should have a minimum of:  o 15 grams of protein  o And no more than 5 grams of carbohydrate    Goal for protein each day: o Men = 80 grams per day o Women = 60 grams per day   Protein powder may be added to fluids such as non-fat milk or Lactaid milk or Soy milk (limit to 35 grams added protein powder per serving)  Hydration   Slowly increase the amount of water and other clear liquids as tolerated (See Acceptable Fluids)   Slowly increase the amount of protein shake as tolerated     Sip fluids slowly and throughout the day   May use sugar substitutes in small amounts (no more than 6 - 8 packets per day; i.e. Splenda)  Fluid Goal   The first goal is to drink at least 8 ounces of protein shake/drink per day (or as directed by the nutritionist); some examples of protein shakes are Syntrax Nectar, Adkins Advantage, EAS Edge HP, and Unjury. See handout from pre-op Bariatric Education Class: o Slowly increase the amount of protein shake you drink as tolerated o You may find it easier to slowly sip shakes throughout the day o It is important to get your proteins in first   Your fluid goal is to drink 64 - 100 ounces of fluid daily o It may take a few weeks to build up to this   32 oz (or more) should be clear liquids  And    32 oz (or more) should be full liquids (see below for examples)   Liquids should not contain sugar, caffeine, or  carbonation  Clear Liquids:   Water or Sugar-free flavored water (i.e. Fruit H2O, Propel)   Decaffeinated coffee or tea (sugar-free)   Crystal Lite, Wyler's Lite, Minute Maid Lite   Sugar-free Jell-O   Bouillon or broth   Sugar-free Popsicle:   *Less than 20 calories each; Limit 1 per day  Full Liquids: Protein Shakes/Drinks + 2 choices per day of other full liquids   Full liquids must be: o No More Than 12 grams of Carbs per serving  o No   More Than 3 grams of Fat per serving   Strained low-fat cream soup   Non-Fat milk   Fat-free Lactaid Milk   Sugar-free yogurt (Dannon Lite & Fit, Greek yogurt)      Vitamins and Minerals   Start 1 day after surgery unless otherwise directed by your surgeon   2 Chewable Multivitamin / Multimineral Supplement with iron (i.e. Centrum for Adults)   Vitamin B-12, 350 - 500 micrograms sub-lingual (place tablet under the tongue) each day   Chewable Calcium Citrate with Vitamin D-3 (Example: 3 Chewable Calcium Plus 600 with Vitamin D-3) o Take 500 mg three (3) times a day for a total of 1500 mg each day o Do not take all 3 doses of calcium at one time as it may cause constipation, and you can only absorb 500 mg  at a time  o Do not mix multivitamins containing iron with calcium supplements; take 2 hours apart o Do not substitute Tums (calcium carbonate) for your calcium   Menstruating women and those at risk for anemia (a blood disease that causes weakness) may need extra iron o Talk with your doctor to see if you need more iron   If you need extra iron: Total daily Iron recommendation (including Vitamins) is 50 to 100 mg Iron/day   Do not stop taking or change any vitamins or minerals until you talk to your nutritionist or surgeon   Your nutritionist and/or surgeon must approve all vitamin and mineral supplements   Activity and Exercise: It is important to continue walking at home.  Limit your physical activity as instructed by your doctor.  During  this time, use these guidelines:   Do not lift anything greater than ten (10) pounds for at least two (2) weeks   Do not go back to work or drive until your surgeon says you can   You may have sex when you feel comfortable  o It is VERY important for female patients to use a reliable birth control method; fertility often increases after surgery  o Do not get pregnant for at least 18 months   Start exercising as soon as your doctor tells you that you can o Make sure your doctor approves any physical activity   Start with a simple walking program   Walk 5-15 minutes each day, 7 days per week.    Slowly increase until you are walking 30-45 minutes per day Consider joining our BELT program. [phone] or email [email or web address]   Special Instructions Things to remember:   Free counseling is available for you and your family through collaboration between [facility] and [employer]. Please call [phone] and leave a message   Use your CPAP when sleeping if this applies to you   [facility] has a free Bariatric Surgery Support Group that meets monthly, the 3rd Thursday, 6 pm, [facility] Classrooms You can see classes online at [email or web address]   It is very important to keep all follow up appointments with your surgeon, nutritionist, primary care physician, and behavioral health practitioner o After the first year, please follow up with your bariatric surgeon and nutritionist at least once a year in order to maintain best weight loss results [facility]: [phone] [facility]: [phone] Bariatric Nurse Coordinator: [phone]     

## 2012-08-26 NOTE — Progress Notes (Signed)
2 Days Post-Op  Subjective: No nausea, pain minimal, no complaints  Objective: Vital signs in last 24 hours: Temp:  [98 F (36.7 C)-98.8 F (37.1 C)] 98 F (36.7 C) (08/07 0527) Pulse Rate:  [100-109] 102 (08/07 0527) Resp:  [16-18] 18 (08/07 0527) BP: (111-141)/(67-84) 132/74 mmHg (08/07 0527) SpO2:  [93 %-100 %] 97 % (08/07 0527) Last BM Date: 08/24/12  Intake/Output from previous day: 08/06 0701 - 08/07 0700 In: 3240 [P.O.:240; I.V.:3000] Out: 1145 [Urine:1000; Drains:145] Intake/Output this shift:    General appearance: alert, cooperative and no distress Resp: clear to auscultation bilaterally Cardio: normal rate, (HR 72 as I test it manually), regular GI: soft, minimal tenderness, ND, wounds without infection, JP ss Extremities: SCD's bilat  Lab Results:   Recent Labs  08/25/12 0351 08/26/12 0410  WBC 20.3* 12.4*  HGB 11.3* 9.8*  HCT 35.3* 31.8*  PLT 357 285   BMET  Recent Labs  08/25/12 0351  NA 137  K 4.0  CL 102  CO2 27  GLUCOSE 234*  BUN 6  CREATININE 0.84  CALCIUM 8.8   PT/INR No results found for this basename: LABPROT, INR,  in the last 72 hours ABG No results found for this basename: PHART, PCO2, PO2, HCO3,  in the last 72 hours  Studies/Results: No results found.  Anti-infectives: Anti-infectives   Start     Dose/Rate Route Frequency Ordered Stop   08/24/12 0800  ertapenem (INVANZ) 1 g in sodium chloride 0.9 % 50 mL IVPB     1 g 100 mL/hr over 30 Minutes Intravenous  Once 08/24/12 0748 08/24/12 1115      Assessment/Plan: s/p Procedure(s): LAPAROSCOPIC ROUX-EN-Y GASTRIC BYPASS WITH UPPER ENDOSCOPY (N/A) she looks and feels well.  ambulatory, tolerating diet and minimal discomfort.  no evidence of postop complication.  she should be okay for discharge to home. I reviewed her discharge instructions with her.  LOS: 2 days    Lodema Pilot DAVID 08/26/2012

## 2012-08-26 NOTE — Progress Notes (Signed)
Assessment unchanged. Tolerated protein shake. Scripts x 4 as provided by MD. Pt verbalized understanding of dc instructions through teach back. Introduced to My Chart with verbalized understanding. Extra gauze given to pt to dress dc'd jp site for next day or so. Discharged via wc to front entrance to meet awaiting vehicle to carry home. Accompanied by friend and NT.

## 2012-08-28 NOTE — Progress Notes (Addendum)
Bariatric Class:  Appt start time: 0830   End time:  0930.  Pre-Operative Nutrition Class  Patient was seen on 08/12/12 for Pre-Operative [facility]ric [facility]   Surgery date: 08/24/12 Surgery type: RYGB Start weight at [facility]:  307.8 lbs (03/24/12) Goal weight: 185 lbs  Weight today: 308.5 lbs Weight change: n/a Total weight lost: n/a  TANITA  BODY COMP RESULTS  08/12/12   BMI (kg/m^2) 44.9   Fat Mass (lbs) 168.0   Fat Free Mass (lbs) 140.5   Total Body Water (lbs) 103.0   Samples given per MNT protocol; Patient educated on appropriate usage: Bariatric Advantage Multivitamin Lot # [id number] Exp: 06/15  Bariatric Advantage Calcium Citrate Lot # [id number] Exp: 03/15  Bariatric Advantage B12 Lot # [id number] Exp: 10/15  Celebrate Vitamins Multivitamin Lot # [id number] Exp: 07/15  Celebrate Vitamins Calcium Citrate Lot # [id number] Exp: 09/15  Corliss Marcus Protein Powder Lot # [id number] Exp: 09/15  The following the learning objectives met by the patient during this course:  Identify Pre-Op Dietary Goals and will begin 2 weeks pre-operatively  Identify appropriate sources of fluids and proteins   State protein recommendations and appropriate sources pre and post-operatively  Identify Post-Operative Dietary Goals and will follow for 2 weeks post-operatively  Identify appropriate multivitamin and calcium sources  Describe the need for physical activity post-operatively and will follow MD recommendations  State when to call healthcare provider regarding medication questions or post-operative complications  Handouts given during class include:  Pre-Op Bariatric Surgery Diet Handout  Protein Shake Handout  Post-Op Bariatric Surgery Nutrition Handout  BELT Program Information Flyer  Support Group Information Flyer  [facility] Bariatric Supplements Price List  Follow-Up Plan: Patient will follow-up at [facility] 2 weeks  post operatively for diet advancement per MD.

## 2012-08-28 NOTE — Patient Instructions (Signed)
Follow:   Pre-Op Diet per MD 2 weeks prior to surgery  Phase 2- Liquids (clear/full) 2 weeks after surgery  Vitamin/Mineral/Calcium guidelines for purchasing bariatric supplements  Exercise guidelines pre and post-op per MD  Follow-up at [facility] in 2 weeks post-op for diet advancement. Contact Fantasia Jinkins as needed with questions/concerns. 

## 2012-08-31 NOTE — Discharge Summary (Signed)
Physician Discharge Summary  Patient ID: [name] [name] MRN: [id number] DOB/AGE: [birth year] 32 y.o.  Admit date: 08/24/2012 Discharge date: 08/26/12  Admission Diagnoses:obesity  Discharge Diagnoses: same Active Problems:   * No active hospital problems. *   Discharged Condition: stable  Hospital Course: To OR for lap RYGB with EGD.  Uneventful procedure.  Diet was advanced on POD 1 and pain continued to improve.  Drain removed and she was stable for discharge on POD 2.  Consults: None  Significant Diagnostic Studies: none  Treatments: surgery: 08/24/12 lap RYGB and EGD  Disposition: 01-Home or Self Care  Discharge Orders   Future Appointments Provider Department Dept Phone   09/07/2012 4:00 PM [facility] Post-Op Class [facility] [phone]   09/08/2012 2:15 PM Lodema Pilot, DO [facility], [place] [phone]   Future Orders Complete By Expires     Call MD for:  difficulty breathing, headache or visual disturbances  As directed     Call MD for:  persistant nausea and vomiting  As directed     Call MD for:  redness, tenderness, or signs of infection (pain, swelling, redness, odor or green/yellow discharge around incision site)  As directed     Call MD for:  severe uncontrolled pain  As directed     Call MD for:  temperature >100.4  As directed     Discharge instructions  As directed     Comments:      May shower tomorrow. Increase activity as tolerated.  Ambulate frequently. Crush all medications or take liquid medications for 4 weeks. Call [phone] for follow up appointment with Dr. Biagio Quint in about 3 weeks Clear liquid diet until Saturday, then full liquid diet x1 week, then pureed diet x1 week, then soft diet x1 week, then advance to high protein, low fat, low carb diet    Increase activity slowly  As directed         Medication List    STOP taking these medications       glyBURIDE 2.5 MG tablet  Commonly known as:   DIABETA      TAKE these medications       lisinopril 5 MG tablet  Commonly known as:  PRINIVIL,ZESTRIL  Take 5 mg by mouth every morning.     ondansetron 4 MG disintegrating tablet  Commonly known as:  ZOFRAN ODT  Take 1 tablet (4 mg total) by mouth every 8 (eight) hours as needed for nausea.     oxyCODONE-acetaminophen 5-325 MG/5ML solution  Commonly known as:  ROXICET  Take 7.5 mLs by mouth every 4 (four) hours as needed.     pantoprazole sodium 40 mg/20 mL Pack  Commonly known as:  PROTONIX  Place 20 mLs (40 mg total) into feeding tube daily. Take 20ml by mouth daily     ursodiol 300 MG capsule  Commonly known as:  ACTIGALL  Take 1 capsule (300 mg total) by mouth 2 (two) times daily.         SignedLodema Pilot DAVID 08/31/2012, 5:32 PM

## 2012-09-01 ENCOUNTER — Ambulatory Visit (HOSPITAL_COMMUNITY)
Admission: RE | Admit: 2012-09-01 | Discharge: 2012-09-01 | Disposition: A | Discharge status: Home / Self Care | Payer: BC Managed Care – PPO | Source: Ambulatory Visit | Attending: General Surgery | Admitting: General Surgery

## 2012-09-01 ENCOUNTER — Telehealth (INDEPENDENT_AMBULATORY_CARE_PROVIDER_SITE_OTHER): Payer: Self-pay | Admitting: General Surgery

## 2012-09-01 ENCOUNTER — Other Ambulatory Visit (INDEPENDENT_AMBULATORY_CARE_PROVIDER_SITE_OTHER): Payer: Self-pay

## 2012-09-01 DIAGNOSIS — I82403 Acute embolism and thrombosis of unspecified deep veins of lower extremity, bilateral: Secondary | ICD-10-CM

## 2012-09-01 DIAGNOSIS — Z9884 Bariatric surgery status: Secondary | ICD-10-CM | POA: Insufficient documentation

## 2012-09-01 DIAGNOSIS — M79609 Pain in unspecified limb: Secondary | ICD-10-CM

## 2012-09-01 NOTE — Progress Notes (Signed)
Bilateral lower extremity venous duplex:  No evidence of DVT, superficial thrombosis, or Baker's Cyst.   

## 2012-09-01 NOTE — Telephone Encounter (Signed)
Duplex negative.  She was instructed to make sure that she is taking her MVI and could add a B complex vitamin as well.  If any further concerns or worsening symptoms she should call us back immediately.

## 2012-09-01 NOTE — Telephone Encounter (Signed)
Pt called to report tenderness and pain in both her calves since yesterday and woke today with some bruising in the Lt calf.  She is 1 week out from RNY surgery.  Paged and updated Dr. Biagio Quint; ordered bilateral duplexes to R/O DVT.

## 2012-09-01 NOTE — Telephone Encounter (Signed)
Called pt to let her know the test was negative for DVT, a very good thing.  Related the update from Dr. Biagio Quint, to ensure she is taking her MVI and consider adding B-complex as well.  She was relieved to get the update and will call back prn.

## 2012-09-07 ENCOUNTER — Encounter: Payer: BC Managed Care – PPO | Attending: General Surgery | Admitting: *Deleted

## 2012-09-07 DIAGNOSIS — Z713 Dietary counseling and surveillance: Secondary | ICD-10-CM | POA: Insufficient documentation

## 2012-09-08 ENCOUNTER — Encounter (INDEPENDENT_AMBULATORY_CARE_PROVIDER_SITE_OTHER): Payer: Self-pay | Admitting: General Surgery

## 2012-09-08 ENCOUNTER — Ambulatory Visit (INDEPENDENT_AMBULATORY_CARE_PROVIDER_SITE_OTHER): Payer: BC Managed Care – PPO | Admitting: General Surgery

## 2012-09-08 ENCOUNTER — Encounter (INDEPENDENT_AMBULATORY_CARE_PROVIDER_SITE_OTHER): Payer: BC Managed Care – PPO | Admitting: General Surgery

## 2012-09-08 VITALS — BP 122/88 | HR 72 | Temp 97.9°F | Resp 15 | Ht 69.5 in | Wt 281.6 lb

## 2012-09-08 DIAGNOSIS — Z5189 Encounter for other specified aftercare: Secondary | ICD-10-CM

## 2012-09-08 DIAGNOSIS — Z4889 Encounter for other specified surgical aftercare: Secondary | ICD-10-CM

## 2012-09-08 NOTE — Progress Notes (Signed)
Subjective:     Patient ID: [name] [name], female   DOB: [birth year], 32 y.o.   MRN: [id number]  HPI This patient follows up to a half weeks status post Roux-en-Y gastric bypass. She has lost about 29 pounds since her procedure. She has some occasional light nausea but no vomiting and she feels as though it's due to waiting too long T. She is not having any food intolerance or dumping syndrome. She is taking her vitamins and protein supplements and staying hydrated. She is not taking her Actigall. She denies any abdominal pain and is walking daily. She is off her blood pressure medications  Review of Systems     Objective:   Physical Exam No acute distress and nontoxic-appearing Her abdomen is soft nontender exam . Her incisions are healing nicely. I removed the sutures from her drain site.    Assessment:     Status post Roux-en-Y gastric bypass-doing well She's doing very well from her procedure and has no evidence of any postoperative complications. She does have some constipation which I think is pretty normal for this stage postoperatively. She seems to be taking her medications as prescribed except for the Actigall. I recommended that she continue to increase her physical activity and to focus on her bariatric diet.    Plan:     Overall she's doing very well and she should continue with better diet and exercise and vitamin supplements stenoses her back in about 2 months for a 3 month checkup

## 2012-09-08 NOTE — Progress Notes (Signed)
  Bariatric Class:  Appt start time: 1600 end time:  1700.  2 Week Post-Operative Nutrition Class  Patient was seen on 09/06/12 for Post-Operative Nutrition [facility] at [facility].   Surgery date: 08/24/12 Surgery type: RYGB Start weight at [facility]:  307.8 lbs (03/24/12) Goal weight: 185 lbs  Weight today: 308.5 lbs Weight change: n/a Total weight lost: n/a  TANITA  BODY COMP RESULTS  08/12/12   BMI (kg/m^2) 44.9   Fat Mass (lbs) 168.0   Fat Free Mass (lbs) 140.5   Total Body Water (lbs) 103.0   The following the learning objectives were met by the patient during this course:   Identifies Phase 3A (Soft, High Proteins) Dietary Goals and will begin from 2 weeks post-operatively to 2 months post-operatively  Identifies appropriate sources of fluids and proteins   States protein recommendations and appropriate sources post-operatively  Identifies the need for appropriate texture modifications, mastication, and bite sizes when consuming solids  Identifies appropriate multivitamin and calcium sources post-operatively  Describes the need for physical activity post-operatively and will follow MD recommendations  States when to call healthcare provider regarding medication questions or post-operative complications  Handouts given during class include:  Phase 3A: Soft, High Protein Diet Handout  Band Fill Guidelines Handout  Follow-Up Plan: Patient will follow-up at [facility] in 6 weeks for 2 months post-op nutrition visit for diet advancement per MD.

## 2012-10-01 ENCOUNTER — Other Ambulatory Visit (INDEPENDENT_AMBULATORY_CARE_PROVIDER_SITE_OTHER): Payer: Self-pay

## 2012-10-01 ENCOUNTER — Telehealth (INDEPENDENT_AMBULATORY_CARE_PROVIDER_SITE_OTHER): Payer: Self-pay | Admitting: General Surgery

## 2012-10-01 DIAGNOSIS — K222 Esophageal obstruction: Secondary | ICD-10-CM

## 2012-10-01 DIAGNOSIS — R112 Nausea with vomiting, unspecified: Secondary | ICD-10-CM

## 2012-10-01 MED ORDER — SCOPOLAMINE 1 MG/3DAYS TD PT72: 1.0000 | MEDICATED_PATCH | TRANSDERMAL | Status: DC

## 2012-10-01 NOTE — Telephone Encounter (Signed)
She called today saying that she has been having a week and a half of nausea without significant vomiting but she has been spitting up saliva. She hasn't really been eating solid foods but she has been able to keep down her proteins and liquids although she continues to have the nausea. She did not have any abdominal pain or fevers. I recommended that she try a scopolamine patch for both nausea and as a anti-sialagogue. We will set her up for upper GI to evaluate for possible stricture.

## 2012-10-05 ENCOUNTER — Other Ambulatory Visit (INDEPENDENT_AMBULATORY_CARE_PROVIDER_SITE_OTHER): Payer: Self-pay | Admitting: General Surgery

## 2012-10-05 DIAGNOSIS — R110 Nausea: Secondary | ICD-10-CM

## 2012-10-06 ENCOUNTER — Telehealth (INDEPENDENT_AMBULATORY_CARE_PROVIDER_SITE_OTHER): Payer: Self-pay

## 2012-10-06 NOTE — Telephone Encounter (Signed)
Pt calling to schedule her UGI.  She would like to have it scheduled this week since she is off from work.  Please call at [phone].

## 2012-10-07 ENCOUNTER — Other Ambulatory Visit (INDEPENDENT_AMBULATORY_CARE_PROVIDER_SITE_OTHER): Payer: Self-pay

## 2012-10-08 ENCOUNTER — Ambulatory Visit (HOSPITAL_COMMUNITY)
Admission: RE | Admit: 2012-10-08 | Discharge: 2012-10-08 | Disposition: A | Discharge status: Home / Self Care | Payer: BC Managed Care – PPO | Source: Ambulatory Visit | Attending: General Surgery | Admitting: General Surgery

## 2012-10-08 DIAGNOSIS — K224 Dyskinesia of esophagus: Secondary | ICD-10-CM | POA: Insufficient documentation

## 2012-10-08 DIAGNOSIS — Z934 Other artificial openings of gastrointestinal tract status: Secondary | ICD-10-CM | POA: Insufficient documentation

## 2012-10-08 DIAGNOSIS — R110 Nausea: Secondary | ICD-10-CM

## 2012-10-19 ENCOUNTER — Ambulatory Visit: Payer: BC Managed Care – PPO | Admitting: *Deleted

## 2012-10-20 ENCOUNTER — Encounter: Payer: BC Managed Care – PPO | Attending: General Surgery | Admitting: *Deleted

## 2012-10-20 DIAGNOSIS — Z713 Dietary counseling and surveillance: Secondary | ICD-10-CM | POA: Insufficient documentation

## 2012-10-20 NOTE — Progress Notes (Signed)
  Follow-up visit:  8 Weeks Post-Operative RYGB Surgery  Medical Nutrition Therapy:  Appt start time: 1530 end time:  1630.  Primary concerns today: Post-operative Bariatric Surgery Nutrition Management.  Surgery date: 08/24/12 Surgery type: RYGB Start weight at [facility]:  307.8 lbs (03/24/12) Goal weight: 185 lbs  Weight today: 258.0 lbs Weight change: 23.0 lbs Total weight lost: 49.8 lbs (- 41.5 as FAT MASS)  TANITA  BODY COMP RESULTS  08/12/12 09/07/12 10/20/12   BMI (kg/m^2) 44.9 40.9 38.1   Fat Mass (lbs) 168.0 153.5 126.5   Fat Free Mass (lbs) 140.5 127.5 131.5   Total Body Water (lbs) 103.0 93.5 96.5   24-hr recall: B (AM): Premier protein shake (30g) Snk (AM): NONE  L (PM): 3 oz lean protein OR chick-fil-a chicken soup (eats only broth) Snk (PM): NONE  D (PM): 3 oz lean protein OR soup Snk (PM): NONE  Fluid intake: 11 oz protein shake, 50 oz water, 8 oz decaf coffee = 60-65 oz Estimated total protein intake:  ~ 40 g  Medications: Off all DM meds Supplementation: Taking. Having trouble remembering all 3 doses of calcium  CBG monitoring: Once weekly Last patient reported A1c: 7.5% (2 weeks after surgery)  Using straws: No Drinking while eating: No Hair loss: No Carbonated beverages: No N/V/D/C:  Intermittent nausea; likely d/t hunger  Dumping syndrome: None  Recent physical activity:  None for last few weeks d/t being sick. Just getting back to work (works 3 jobs) and getting schedule on track.   Progress Towards Goal(s):  In progress.   Nutritional Diagnosis:  [place]-3.3 Overweight/obesity related to past poor dietary habits and physical inactivity as evidenced by patient w/ recent RYGB surgery following dietary guidelines for continued weight loss.    Intervention:  Nutrition education/diet advancement.  Monitoring/Evaluation:  Dietary intake, exercise, lap band fills, and body weight. Follow up in 1 months for 3 month post-op visit.

## 2012-10-20 NOTE — Patient Instructions (Signed)
Goals:  Follow Phase 3B: High Protein + Non-Starchy Vegetables  Eat 3-6 small meals/snacks, every 3-5 hrs  Increase lean protein foods to meet 60-80g goal  Increase fluid intake to 64oz +  Avoid drinking 15 minutes before, during and 30 minutes after eating  Aim for >30 min of physical activity daily 

## 2012-11-11 ENCOUNTER — Ambulatory Visit (INDEPENDENT_AMBULATORY_CARE_PROVIDER_SITE_OTHER): Payer: BC Managed Care – PPO | Admitting: General Surgery

## 2012-11-25 ENCOUNTER — Ambulatory Visit (INDEPENDENT_AMBULATORY_CARE_PROVIDER_SITE_OTHER): Payer: BC Managed Care – PPO | Admitting: General Surgery

## 2012-11-25 ENCOUNTER — Encounter (INDEPENDENT_AMBULATORY_CARE_PROVIDER_SITE_OTHER): Payer: Self-pay

## 2012-11-25 ENCOUNTER — Encounter (INDEPENDENT_AMBULATORY_CARE_PROVIDER_SITE_OTHER): Payer: Self-pay | Admitting: General Surgery

## 2012-11-25 VITALS — BP 124/86 | HR 100 | Temp 97.3°F | Resp 16 | Ht 69.0 in | Wt 240.8 lb

## 2012-11-25 DIAGNOSIS — K912 Postsurgical malabsorption, not elsewhere classified: Secondary | ICD-10-CM

## 2012-11-25 NOTE — Progress Notes (Signed)
Subjective:     Patient ID: [name] [name], female   DOB: 02-28-1980, 32 y.o.   MRN: [id number]  HPI This patient follows up 3 months status post Roux-en-Y gastric bypass. She says that she really isn't doing much exercise but she continues to lose weight. She has lost about 70 pounds since her procedure and feels well. She says that she "has got her life back". She is taking her protein supplements and vitamins and hasn't had any dumping except for possibly one episode. Her nausea and vomiting has resolved. She is off all of her diabetes medications and high blood pressure medications and she has no complaints. She says that her bowels are functioning normally. She denies any weakness or numbness or tingling  Review of Systems     Objective:   Physical Exam She is in no acute distress and nontoxic-appearing Her abdomen is soft and nontender, her incisions are healing nicely. She has no neurologic deficits    Assessment:     Status post Roux-en-Y gastric bypass-doing well She's doing very well from her procedure without any evidence of postoperative complications. Her nausea and vomiting has resolved and she feels well. She is  Not exercising frequent but she is planning on starting a boxing class soon and I think that this would be good for her to get some additional exercise. Regardless of this, she has continued to lose weight nicely she is taking her Protonix and Actigall and I recommend that she continue this for about another 3 months and then she can probably come off of it at that time.     Plan:     We will check nutrition labs and I'll see her back in about 3 months Continuous bariatric diet and vitamin supplements Increase physical activity as tolerated

## 2012-12-07 ENCOUNTER — Emergency Department (HOSPITAL_COMMUNITY): Payer: BC Managed Care – PPO

## 2012-12-07 ENCOUNTER — Emergency Department (HOSPITAL_COMMUNITY)
Admission: EM | Admit: 2012-12-07 | Discharge: 2012-12-07 | Disposition: A | Discharge status: Home / Self Care | Payer: BC Managed Care – PPO | Attending: Emergency Medicine | Admitting: Emergency Medicine

## 2012-12-07 ENCOUNTER — Encounter (HOSPITAL_COMMUNITY): Payer: Self-pay | Admitting: Emergency Medicine

## 2012-12-07 DIAGNOSIS — E669 Obesity, unspecified: Secondary | ICD-10-CM | POA: Insufficient documentation

## 2012-12-07 DIAGNOSIS — T07XXXA Unspecified multiple injuries, initial encounter: Secondary | ICD-10-CM

## 2012-12-07 DIAGNOSIS — Y9389 Activity, other specified: Secondary | ICD-10-CM | POA: Insufficient documentation

## 2012-12-07 DIAGNOSIS — S0003XA Contusion of scalp, initial encounter: Secondary | ICD-10-CM | POA: Insufficient documentation

## 2012-12-07 DIAGNOSIS — Z3202 Encounter for pregnancy test, result negative: Secondary | ICD-10-CM | POA: Insufficient documentation

## 2012-12-07 DIAGNOSIS — Z8719 Personal history of other diseases of the digestive system: Secondary | ICD-10-CM | POA: Insufficient documentation

## 2012-12-07 DIAGNOSIS — S81009A Unspecified open wound, unspecified knee, initial encounter: Secondary | ICD-10-CM | POA: Insufficient documentation

## 2012-12-07 DIAGNOSIS — I10 Essential (primary) hypertension: Secondary | ICD-10-CM | POA: Insufficient documentation

## 2012-12-07 DIAGNOSIS — IMO0002 Reserved for concepts with insufficient information to code with codable children: Secondary | ICD-10-CM | POA: Insufficient documentation

## 2012-12-07 DIAGNOSIS — Y92410 Unspecified street and highway as the place of occurrence of the external cause: Secondary | ICD-10-CM | POA: Insufficient documentation

## 2012-12-07 DIAGNOSIS — S8010XA Contusion of unspecified lower leg, initial encounter: Secondary | ICD-10-CM | POA: Insufficient documentation

## 2012-12-07 DIAGNOSIS — E119 Type 2 diabetes mellitus without complications: Secondary | ICD-10-CM | POA: Insufficient documentation

## 2012-12-07 DIAGNOSIS — Z791 Long term (current) use of non-steroidal anti-inflammatories (NSAID): Secondary | ICD-10-CM | POA: Insufficient documentation

## 2012-12-07 LAB — BASIC METABOLIC PANEL
CO2: 25 mEq/L (ref 19–32)
Calcium: 8.7 mg/dL (ref 8.4–10.5)
Creatinine, Ser: 0.64 mg/dL (ref 0.50–1.10)
GFR calc Af Amer: >90 mL/min (ref >90)
Sodium: 140 mEq/L (ref 135–145)

## 2012-12-07 LAB — CBC WITH DIFFERENTIAL/PLATELET
Basophils Absolute: 0 10*3/uL (ref 0.0–0.1)
Basophils Relative: 0 % (ref 0–1)
Eosinophils Relative: 2 % (ref 0–5)
Lymphocytes Relative: 20 % (ref 12–46)
MCHC: 33.3 g/dL (ref 30.0–36.0)
MCV: 78.4 fL (ref 78.0–100.0)
Monocytes Absolute: 0.5 10*3/uL (ref 0.1–1.0)
Neutro Abs: 4.8 10*3/uL (ref 1.7–7.7)
Platelets: 313 10*3/uL (ref 150–400)
RDW: 18.7 % — ABNORMAL HIGH (ref 11.5–15.5)
WBC: 6.7 10*3/uL (ref 4.0–10.5)

## 2012-12-07 LAB — PREGNANCY, URINE: Preg Test, Ur: NEGATIVE

## 2012-12-07 MED ORDER — IBUPROFEN 800 MG PO TABS: 800.0000 mg | ORAL_TABLET | Freq: Three times a day (TID) | ORAL | Status: DC

## 2012-12-07 MED ORDER — OXYCODONE-ACETAMINOPHEN 5-325 MG PO TABS: 1.0000 | ORAL_TABLET | Freq: Four times a day (QID) | ORAL | Status: DC | PRN

## 2012-12-07 MED ORDER — BACITRACIN ZINC 500 UNIT/GM EX OINT
1.0000 | TOPICAL_OINTMENT | Freq: Two times a day (BID) | CUTANEOUS | Status: DC
Start: 2012-12-07 — End: 2012-12-07
  Administered 2012-12-07: 1 via TOPICAL
  Filled 2012-12-07 (×2): qty 0.9

## 2012-12-07 MED ORDER — SODIUM CHLORIDE 0.9 % IV BOLUS (SEPSIS)
1000.0000 mL | Freq: Once | INTRAVENOUS | Status: AC
  Administered 2012-12-07: 1000 mL via INTRAVENOUS

## 2012-12-07 MED ORDER — MORPHINE SULFATE 4 MG/ML IJ SOLN
4.0000 mg | Freq: Once | INTRAMUSCULAR | Status: AC
  Administered 2012-12-07: 4 mg via INTRAVENOUS
  Filled 2012-12-07: qty 1

## 2012-12-07 MED ORDER — IOHEXOL 350 MG/ML SOLN
100.0000 mL | Freq: Once | INTRAVENOUS | Status: AC | PRN
  Administered 2012-12-07: 100 mL via INTRAVENOUS

## 2012-12-07 MED ORDER — ONDANSETRON HCL 4 MG/2ML IJ SOLN
4.0000 mg | Freq: Once | INTRAMUSCULAR | Status: AC
  Administered 2012-12-07: 4 mg via INTRAVENOUS
  Filled 2012-12-07: qty 2

## 2012-12-07 MED ORDER — CYCLOBENZAPRINE HCL 10 MG PO TABS: 10.0000 mg | ORAL_TABLET | Freq: Two times a day (BID) | ORAL | Status: DC | PRN

## 2012-12-07 NOTE — ED Notes (Signed)
Pt still out of the department at this time 
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
## 2012-12-07 NOTE — ED Notes (Signed)
Pt transported to Radiology Department and family have been moved to room 34 of which the pt will come back to
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
## 2012-12-07 NOTE — ED Notes (Signed)
Returned from [employer]. VS updated

## 2012-12-07 NOTE — ED Notes (Signed)
Patient transported to X-ray 

## 2012-12-10 ENCOUNTER — Encounter (HOSPITAL_BASED_OUTPATIENT_CLINIC_OR_DEPARTMENT_OTHER): Payer: Self-pay | Admitting: Emergency Medicine

## 2012-12-10 ENCOUNTER — Emergency Department (HOSPITAL_BASED_OUTPATIENT_CLINIC_OR_DEPARTMENT_OTHER)
Admission: EM | Admit: 2012-12-10 | Discharge: 2012-12-10 | Disposition: A | Discharge status: Home / Self Care | Payer: Worker's Compensation | Attending: Emergency Medicine | Admitting: Emergency Medicine

## 2012-12-10 DIAGNOSIS — Z8719 Personal history of other diseases of the digestive system: Secondary | ICD-10-CM | POA: Insufficient documentation

## 2012-12-10 DIAGNOSIS — G8911 Acute pain due to trauma: Secondary | ICD-10-CM | POA: Insufficient documentation

## 2012-12-10 DIAGNOSIS — S161XXD Strain of muscle, fascia and tendon at neck level, subsequent encounter: Secondary | ICD-10-CM

## 2012-12-10 DIAGNOSIS — M79609 Pain in unspecified limb: Secondary | ICD-10-CM | POA: Insufficient documentation

## 2012-12-10 DIAGNOSIS — M542 Cervicalgia: Secondary | ICD-10-CM | POA: Insufficient documentation

## 2012-12-10 DIAGNOSIS — I10 Essential (primary) hypertension: Secondary | ICD-10-CM | POA: Insufficient documentation

## 2012-12-10 DIAGNOSIS — T07XXXA Unspecified multiple injuries, initial encounter: Secondary | ICD-10-CM

## 2012-12-10 DIAGNOSIS — Z791 Long term (current) use of non-steroidal anti-inflammatories (NSAID): Secondary | ICD-10-CM | POA: Insufficient documentation

## 2012-12-10 DIAGNOSIS — E669 Obesity, unspecified: Secondary | ICD-10-CM | POA: Insufficient documentation

## 2012-12-10 DIAGNOSIS — E119 Type 2 diabetes mellitus without complications: Secondary | ICD-10-CM | POA: Insufficient documentation

## 2012-12-10 DIAGNOSIS — R52 Pain, unspecified: Secondary | ICD-10-CM | POA: Insufficient documentation

## 2012-12-10 MED ORDER — DIAZEPAM 5 MG PO TABS: 5.0000 mg | ORAL_TABLET | Freq: Three times a day (TID) | ORAL | Status: DC | PRN

## 2012-12-10 NOTE — ED Notes (Signed)
Pt needs form filled out for workers comp so she can go back to work
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
## 2012-12-13 NOTE — ED Provider Notes (Signed)
Medical screening examination/treatment/procedure(s) were performed by non-physician practitioner and as supervising physician I was immediately available for consultation/collaboration.  EKG Interpretation   None        Juliet Rude. Rubin Payor, MD 12/13/12 1546

## 2013-01-21 ENCOUNTER — Ambulatory Visit: Payer: BC Managed Care – PPO | Admitting: *Deleted

## 2013-02-02 ENCOUNTER — Encounter: Payer: BC Managed Care – PPO | Attending: General Surgery | Admitting: Dietician

## 2013-02-02 DIAGNOSIS — Z713 Dietary counseling and surveillance: Secondary | ICD-10-CM | POA: Insufficient documentation

## 2013-02-02 NOTE — Patient Instructions (Signed)
Goals:  Follow Phase 3B: High Protein + Non-Starchy Vegetables  Eat 3-6 small meals/snacks, every 3-5 hrs  Increase lean protein foods to meet 60-80g goal  Increase fluid intake to 64oz +  Avoid drinking 15 minutes before, during and 30 minutes after eating  Aim for >30 min of physical activity daily  Try to remember to take calcium.
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
## 2013-02-23 ENCOUNTER — Encounter (INDEPENDENT_AMBULATORY_CARE_PROVIDER_SITE_OTHER): Payer: Self-pay

## 2013-02-23 ENCOUNTER — Other Ambulatory Visit (INDEPENDENT_AMBULATORY_CARE_PROVIDER_SITE_OTHER): Payer: Self-pay | Admitting: General Surgery

## 2013-02-23 ENCOUNTER — Ambulatory Visit (INDEPENDENT_AMBULATORY_CARE_PROVIDER_SITE_OTHER): Payer: BC Managed Care – PPO | Admitting: General Surgery

## 2013-02-23 VITALS — BP 110/78 | HR 68 | Resp 16 | Ht 69.5 in | Wt 207.7 lb

## 2013-02-23 DIAGNOSIS — Z9884 Bariatric surgery status: Secondary | ICD-10-CM
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
## 2013-03-11 ENCOUNTER — Telehealth: Payer: Self-pay | Admitting: Dietician

## 2013-03-11 NOTE — Telephone Encounter (Signed)
Replied to [name] [name]'s question about whether she could add nuts to her diet and which kinds would work for her. Recommended that she focus most on keeping the portion size of nuts to a small handful when she starts adding in.

## 2013-05-03 ENCOUNTER — Encounter: Payer: BC Managed Care – PPO | Attending: General Surgery | Admitting: Dietician

## 2013-05-03 VITALS — Ht 69.5 in | Wt 194.0 lb

## 2013-05-03 DIAGNOSIS — Z713 Dietary counseling and surveillance: Secondary | ICD-10-CM | POA: Insufficient documentation

## 2013-05-03 DIAGNOSIS — E669 Obesity, unspecified: Secondary | ICD-10-CM

## 2013-05-03 NOTE — Patient Instructions (Addendum)
-  Try to remember to take supplements -Increase fluid intake to meet 64 oz goal -Keep up exercise routine! -Watch portions of carbohydrates    TANITA  BODY COMP RESULTS  08/12/12 09/07/12 10/20/12 02/02/13 05/03/13   BMI (kg/m^2) 44.9 40.9 38.1 31.2 28.2   Fat Mass (lbs) 168.0 153.5 126.5 95.5 75.5   Fat Free Mass (lbs) 140.5 127.5 131.5 119.0 118.5   Total Body Water (lbs) 103.0 93.5 96.5 87.0 87

## 2013-05-03 NOTE — Progress Notes (Signed)
  Follow-up visit: 8 Month Post-Operative RYGB Surgery  Medical Nutrition Therapy:  Appt start time: 915 end time:  945.  Primary concerns today: Post-operative Bariatric Surgery Nutrition Management.  [name] [name] returns with a 20.5 lb weight loss since last visit. She reports feeling good at 194 lbs and has decided that she would like to maintain this weight. She is exercising regularly and reports listening to how her body is feeling. She is adding back some carbs but has had excellent blood glucose control.   Surgery date: 08/24/12 Surgery type: RYGB [facility]:  307.8 lbs (03/24/12) Goal weight: patient states she is content at her current weight  Weight today: 194 lbs Weight change: 20.5 lbs Total weight lost: 114 lbs   TANITA  BODY COMP RESULTS  08/12/12 09/07/12 10/20/12 02/02/13 05/03/13   BMI (kg/m^2) 44.9 40.9 38.1 31.2 28.2   Fat Mass (lbs) 168.0 153.5 126.5 95.5 75.5   Fat Free Mass (lbs) 140.5 127.5 131.5 119.0 118.5   Total Body Water (lbs) 103.0 93.5 96.5 87.0 87   24-hr recall: B (AM): Premier protein shake (30g) Snk (AM): [employer] and [employer] greek with a few pecans and baby carrots (12g) L (PM): 3 oz baked chicken and broccoli cheese casserole (21 g) Snk (PM): NONE  D (PM): 3 oz salmon or chicken (21g) Snk (PM): NONE  Fluid intake: 11 oz protein shake, 16-32 oz water, 8 oz decaf coffee sometimes = 35-50 oz Estimated total protein intake:  ~80 g  Medications: Off all DM and BP meds Supplementation: Taking MVI. Having trouble remembering all 3 doses of calcium  CBG monitoring: Once weekly Last patient reported A1c: no new Average BG: 80-110  Using straws: No Drinking while eating: No Hair loss: No Carbonated beverages: No N/V/D/C: none Dumping syndrome: Once with 2 bite-sized Snickers  Recent physical activity:  Started a boxing class last week (2x a week) and walking/jogging on the weekends  Progress Towards Goal(s):  In progress.   Nutritional  Diagnosis:  [place]-3.3 Overweight/obesity related to past poor dietary habits and physical inactivity as evidenced by patient w/ recent RYGB surgery following dietary guidelines for weight maintenance.    Intervention:  Nutrition education/diet reinforcement.  Monitoring/Evaluation:  Dietary intake, exercise, and body weight. Follow up in 4 months for 12 month post-op visit.

## 2013-08-26 ENCOUNTER — Ambulatory Visit: Payer: Self-pay | Admitting: Women's Health

## 2013-09-02 ENCOUNTER — Ambulatory Visit: Payer: Self-pay | Admitting: Dietician

## 2013-09-30 ENCOUNTER — Ambulatory Visit: Payer: Self-pay | Admitting: Women's Health

## 2013-10-12 ENCOUNTER — Encounter: Payer: BC Managed Care – PPO | Attending: General Surgery | Admitting: Dietician

## 2013-10-12 VITALS — Ht 69.5 in | Wt 184.0 lb

## 2013-10-12 DIAGNOSIS — Z6826 Body mass index (BMI) 26.0-26.9, adult: Secondary | ICD-10-CM | POA: Insufficient documentation

## 2013-10-12 DIAGNOSIS — Z713 Dietary counseling and surveillance: Secondary | ICD-10-CM | POA: Insufficient documentation

## 2013-10-12 DIAGNOSIS — Z9884 Bariatric surgery status: Secondary | ICD-10-CM | POA: Insufficient documentation

## 2013-10-12 DIAGNOSIS — E669 Obesity, unspecified: Secondary | ICD-10-CM | POA: Diagnosis present

## 2013-10-12 NOTE — Patient Instructions (Addendum)
-  Increase fluid intake to meet 64 oz goal -Keep up exercise routine! -Watch portions of carbohydrates  -Start taking vitamins and calcium again  Weight today: 184 lbs Weight change: 10 lbs Total weight lost: 124 lbs   TANITA  BODY COMP RESULTS  08/12/12 09/07/12 10/20/12 02/02/13 05/03/13 10/12/13   BMI (kg/m^2) 44.9 40.9 38.1 31.2 28.2 26.8   Fat Mass (lbs) 168.0 153.5 126.5 95.5 75.5 68.0   Fat Free Mass (lbs) 140.5 127.5 131.5 119.0 118.5 116.0   Total Body Water (lbs) 103.0 93.5 96.5 87.0 87 85.0

## 2013-10-12 NOTE — Progress Notes (Signed)
  Follow-up visit: 13 Month Post-Operative RYGB Surgery  Medical Nutrition Therapy:  Appt start time: 1140 end time:  1210.  Primary concerns today: Post-operative Bariatric Surgery Nutrition Management.  [place] returns with a 10 lb weight loss since last visit. She would still like to tone up a little but doesn't have a new weight loss goal. Plans to get a trainer.   Has learned how to eat to maintain weight and has some splurges every once in a while.    Surgery date: 08/24/12 Surgery type: RYGB Start weight at [facility]:  307.8 lbs (03/24/12) Goal weight: patient states she is content at her current weight  Weight today: 184 lbs Weight change: 10 lbs Total weight lost: 124 lbs   TANITA  BODY COMP RESULTS  08/12/12 09/07/12 10/20/12 02/02/13 05/03/13 10/12/13   BMI (kg/m^2) 44.9 40.9 38.1 31.2 28.2 26.8   Fat Mass (lbs) 168.0 153.5 126.5 95.5 75.5 68.0   Fat Free Mass (lbs) 140.5 127.5 131.5 119.0 118.5 116.0   Total Body Water (lbs) 103.0 93.5 96.5 87.0 87 85.0   24-hr recall: B (AM): Premier protein shake (30g) Snk (AM): Dannon Light and Fit greek with a few pecans and baby carrots (12g) L (PM): 3 oz baked chicken and broccoli cheese casserole (21 g) Snk (PM): NONE  D (PM): 3 oz salmon or chicken (21g) Snk (PM): NONE  Fluid intake: 11 oz protein shake, 50-60 oz water, 8 oz decaf coffee sometimes (susally gets 64 oz) Estimated total protein intake:  ~80 g (some days better than other)  Medications: Off all DM and BP meds Supplementation: No taking regularly  CBG monitoring: Once weekly Last patient reported A1c: no new Average BG: 80-110  Using straws: Sometimes Drinking while eating: No Hair loss: No Carbonated beverages: No N/V/D/C: none Dumping syndrome: Once with 2 bite-sized Snickers  Recent physical activity:  Zumba 1 x week and goes to the gym (bike, walks)  Progress Towards Goal(s):  In progress.   Nutritional Diagnosis:  [place]-3.3 Overweight/obesity related to past  poor dietary habits and physical inactivity as evidenced by patient w/ recent RYGB surgery following dietary guidelines for weight maintenance.    Intervention:  Nutrition education/diet reinforcement.  Monitoring/Evaluation:  Dietary intake, exercise, and body weight. Follow up prn.

## 2013-10-19 ENCOUNTER — Emergency Department (HOSPITAL_COMMUNITY)
Admission: EM | Admit: 2013-10-19 | Discharge: 2013-10-19 | Discharge status: Against Medical Advice | Payer: BC Managed Care – PPO

## 2013-10-30 IMAGING — CR DG UGI W/ KUB
2 series · 2 of 2 positions shown · non-contrast
Comparison: 03/16/2012

FLUOROSCOPY TIME:  2 min, 6 seconds

CLINICAL DATA: Nausea. Six weeks status post Roux-en-Y
gastrojejunostomy. Query anastomotic stricture.

EXAM:
UPPER GI SERIES W/ KUB
TECHNIQUE: After obtaining a scout radiograph a routine upper GI series was
performed using thin and high density barium.

[view not recorded (1 of 2)]
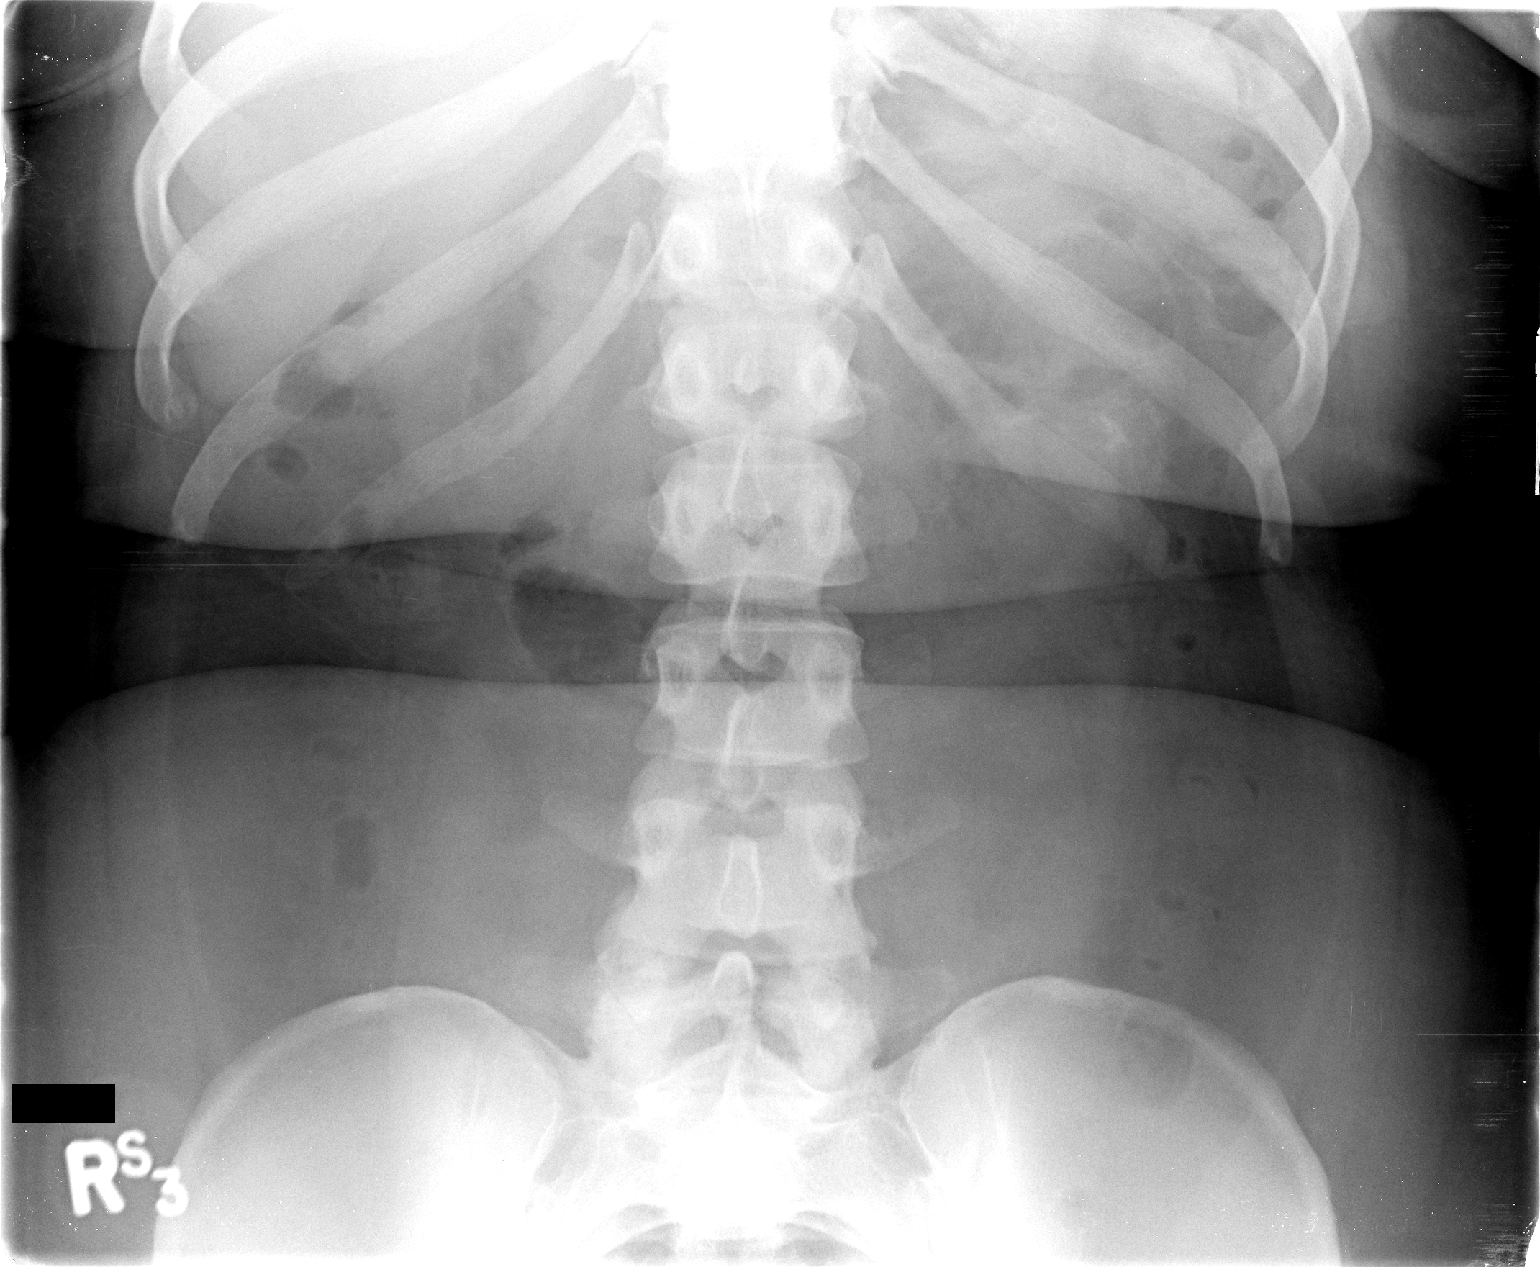

[view not recorded (2 of 2)]
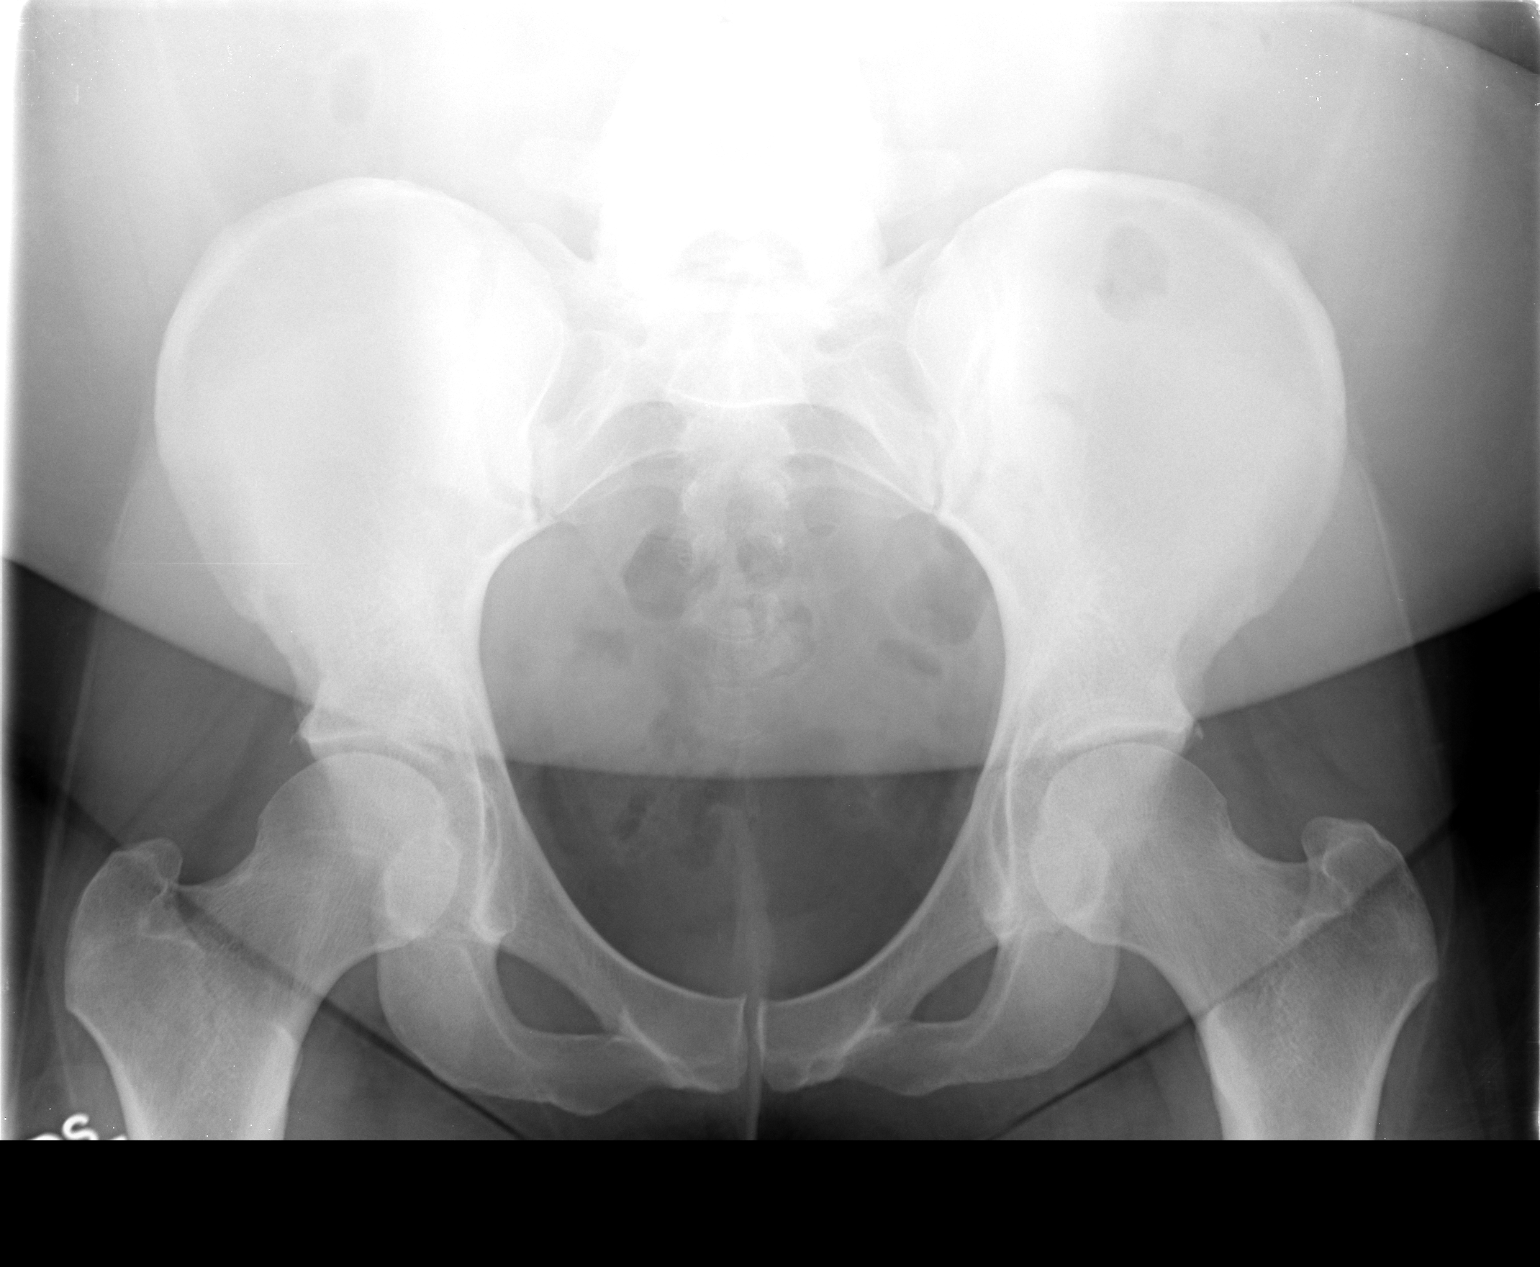

[2 of 2 positions shown; findings below may reference images not displayed]

FINDINGS: The initial KUB looks normal.

Aside from minimal or early spill over, the pharyngeal phase of
swallowing is normal.

There was disruption of primary peristaltic waves in the thoracic
esophagus on [DATE] swallows.

Gastroesophageal junction measures up to 2.3 cm, without stricture.
There is prompt drainage of contrast through the patent
gastrojejunostomy without anastomotic stricture or visualized leak.
Prompt transit of barium was observed in the small bowel. Proximal
jejunal loops are mildly dilated although this was intermittent.

A 13 mm barium tablet passed without difficulty into the remaining
stomach. The patient did complain of mild globus sensation stranding
exam.
IMPRESSION: 1. No stricture at the gastroesophageal junction. The
gastrojejunostomy site appears patent, believed to measure least
cm in diameter. Proximal jejunal loops mildly intermittently
dilated, but without compelling findings of distal obstruction an
without findings of obstruction on the KUB.
2. Mild nonspecific esophageal dysmotility, without disruption of
primary peristaltic waves on [DATE] swallows.

## 2014-01-27 ENCOUNTER — Ambulatory Visit: Payer: Self-pay | Admitting: Women's Health

## 2014-02-15 ENCOUNTER — Encounter: Payer: Self-pay | Admitting: Women's Health

## 2014-02-15 ENCOUNTER — Ambulatory Visit (INDEPENDENT_AMBULATORY_CARE_PROVIDER_SITE_OTHER): Payer: BC Managed Care – PPO | Admitting: Women's Health

## 2014-02-15 ENCOUNTER — Other Ambulatory Visit (HOSPITAL_COMMUNITY)
Admission: RE | Admit: 2014-02-15 | Discharge: 2014-02-15 | Disposition: A | Discharge status: Home / Self Care | Payer: BC Managed Care – PPO | Source: Ambulatory Visit | Attending: Gynecology | Admitting: Gynecology

## 2014-02-15 VITALS — BP 130/82 | Ht 69.0 in | Wt 185.0 lb

## 2014-02-15 DIAGNOSIS — Z01419 Encounter for gynecological examination (general) (routine) without abnormal findings: Secondary | ICD-10-CM | POA: Insufficient documentation

## 2014-02-15 DIAGNOSIS — B9689 Other specified bacterial agents as the cause of diseases classified elsewhere: Secondary | ICD-10-CM

## 2014-02-15 DIAGNOSIS — N760 Acute vaginitis: Secondary | ICD-10-CM

## 2014-02-15 DIAGNOSIS — I82409 Acute embolism and thrombosis of unspecified deep veins of unspecified lower extremity: Secondary | ICD-10-CM

## 2014-02-15 DIAGNOSIS — Z833 Family history of diabetes mellitus: Secondary | ICD-10-CM

## 2014-02-15 DIAGNOSIS — Z13220 Encounter for screening for lipoid disorders: Secondary | ICD-10-CM

## 2014-02-15 DIAGNOSIS — A499 Bacterial infection, unspecified: Secondary | ICD-10-CM

## 2014-02-15 DIAGNOSIS — N898 Other specified noninflammatory disorders of vagina: Secondary | ICD-10-CM

## 2014-02-15 DIAGNOSIS — B3731 Acute candidiasis of vulva and vagina: Secondary | ICD-10-CM

## 2014-02-15 DIAGNOSIS — B373 Candidiasis of vulva and vagina: Secondary | ICD-10-CM

## 2014-02-15 LAB — CBC WITH DIFFERENTIAL/PLATELET
BASOS PCT: 0 % (ref 0–1)
Basophils Absolute: 0 10*3/uL (ref 0.0–0.1)
EOS ABS: 0.2 10*3/uL (ref 0.0–0.7)
Eosinophils Relative: 3 % (ref 0–5)
HCT: 36.6 % (ref 36.0–46.0)
Hemoglobin: 11.9 g/dL — ABNORMAL LOW (ref 12.0–15.0)
LYMPHS ABS: 3 10*3/uL (ref 0.7–4.0)
LYMPHS PCT: 47 % — AB (ref 12–46)
MCH: 27 pg (ref 26.0–34.0)
MCHC: 32.5 g/dL (ref 30.0–36.0)
MCV: 83.2 fL (ref 78.0–100.0)
MPV: 10 fL (ref 8.6–12.4)
Monocytes Absolute: 0.7 10*3/uL (ref 0.1–1.0)
Monocytes Relative: 11 % (ref 3–12)
NEUTROS ABS: 2.5 10*3/uL (ref 1.7–7.7)
Neutrophils Relative %: 39 % — ABNORMAL LOW (ref 43–77)
Platelets: 289 10*3/uL (ref 150–400)
RBC: 4.4 MIL/uL (ref 3.87–5.11)
RDW: 14 % (ref 11.5–15.5)
WBC: 6.3 10*3/uL (ref 4.0–10.5)

## 2014-02-15 LAB — LIPID PANEL
Cholesterol: 153 mg/dL (ref 0–200)
HDL: 73 mg/dL (ref >39)
LDL CALC: 68 mg/dL (ref 0–99)
Total CHOL/HDL Ratio: 2.1 Ratio
Triglycerides: 61 mg/dL (ref <150)
VLDL: 12 mg/dL (ref 0–40)

## 2014-02-15 LAB — WET PREP FOR TRICH, YEAST, CLUE: Trich, Wet Prep: NONE SEEN

## 2014-02-15 MED ORDER — FLUCONAZOLE 150 MG PO TABS: 150.0000 mg | ORAL_TABLET | Freq: Once | ORAL | Status: DC

## 2014-02-15 MED ORDER — METRONIDAZOLE 0.75 % VA GEL
VAGINAL | Status: DC
Start: 2014-02-15 — End: 2015-03-22

## 2014-02-15 NOTE — Progress Notes (Signed)
[place] Smith [birth year] [id number]    History:    Presents for annual exam.  Monthly cycle for 5 days not sexually active for several years. Denies need for STD screen. History of a DVT 08/2012, after gastric bypass surgery. Has lost 125 pounds and is now off medication for diabetes and hypertension.   Past medical history, past surgical history, family history and social history were all reviewed and documented in the EPIC chart. 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  ROS:  A ROS was performed and pertinent positives and negatives are included.  Exam:  Filed Vitals:   02/15/14 1544  BP: 130/82    General appearance:  Normal Thyroid:  Symmetrical, normal in size, without palpable masses or nodularity. Respiratory  Auscultation:  Clear without wheezing or rhonchi Cardiovascular  Auscultation:  Regular rate, without rubs, murmurs or gallops  Edema/varicosities:  Not grossly evident Abdominal  Soft,nontender, without masses, guarding or rebound.  Liver/spleen:  No organomegaly noted  Hernia:  None appreciated  Skin  Inspection:  Grossly normal   Breasts: Examined lying and sitting.     Right: Without masses, retractions, discharge or axillary adenopathy.     Left: Without masses, retractions, discharge or axillary adenopathy. Gentitourinary   Inguinal/mons:  Normal without inguinal adenopathy  External genitalia:  Normal  BUS/Urethra/Skene's glands:  Normal  Vagina:  Moderate white curdy discharge wet prep positive for yeast, amines, clues, TNTC bacteria  Cervix:  Normal  Uterus:   normal in size, shape and contour.  Midline and mobile  Adnexa/parametria:     Rt: Without masses or tenderness.   Lt: Without masses or tenderness.  Anus and perineum: Normal  Digital rectal exam: Normal sphincter tone without palpated masses or tenderness  Assessment/Plan:  34 y.o.SBF G0  for annual exam.    Monthly cycle/not sexually active 08/2012 gastric  bypass surgery-125 pound weight loss-resolved hypertension and diabetes DVT 08/2012 Bacteria vaginosis and yeast  Plan: Contraception reviewed declines need. Reviewed best not to use combination OCs. SBE's, regular exercise, calcium rich diet, MVI daily encouraged. Condoms if sexually active encouraged. MetroGel vaginal cream 1 applicator at bedtime 5, alcohol precautions reviewed, Diflucan 150 by mouth 1 dose prescription, proper use given and reviewed. CBC, hemoglobin A1c, lipid panel, UA, Pap with HR HPV typing. New Pap screening guidelines reviewed last Pap 2012.    Harrington ChallengerYOUNG,Cuthbert Turton J [facility] LLCWHNP, 5:06 PM 02/15/2014

## 2014-02-15 NOTE — Patient Instructions (Signed)
Health Maintenance Adopting a healthy lifestyle and getting preventive care can go a long way to promote health and wellness. Talk with your health care provider about what schedule of regular examinations is right for you. This is a good chance for you to check in with your provider about disease prevention and staying healthy. In between checkups, there are plenty of things you can do on your own. Experts have done a lot of research about which lifestyle changes and preventive measures are most likely to keep you healthy. Ask your health care provider for more information. WEIGHT AND DIET  Eat a healthy diet  Be sure to include plenty of vegetables, fruits, low-fat dairy products, and lean protein.  Do not eat a lot of foods high in solid fats, added sugars, or salt.  Get regular exercise. This is one of the most important things you can do for your health.  Most adults should exercise for at least 150 minutes each week. The exercise should increase your heart rate and make you sweat (moderate-intensity exercise).  Most adults should also do strengthening exercises at least twice a week. This is in addition to the moderate-intensity exercise.  Maintain a healthy weight  Body mass index (BMI) is a measurement that can be used to identify possible weight problems. It estimates body fat based on height and weight. Your health care provider can help determine your BMI and help you achieve or maintain a healthy weight.  For females 61 years of age and older:   A BMI below 18.5 is considered underweight.  A BMI of 18.5 to 24.9 is normal.  A BMI of 25 to 29.9 is considered overweight.  A BMI of 30 and above is considered obese.  Watch levels of cholesterol and blood lipids  You should start having your blood tested for lipids and cholesterol at 35 years of age, then have this test every 5 years.  You may need to have your cholesterol levels checked more often if:  Your lipid or  cholesterol levels are high.  You are older than 35 years of age.  You are at high risk for heart disease.  CANCER SCREENING   Lung Cancer  Lung cancer screening is recommended for adults 77-19 years old who are at high risk for lung cancer because of a history of smoking.  A yearly low-dose CT scan of the lungs is recommended for people who:  Currently smoke.  Have quit within the past 15 years.  Have at least a 30-pack-year history of smoking. A pack year is smoking an average of one pack of cigarettes a day for 1 year.  Yearly screening should continue until it has been 15 years since you quit.  Yearly screening should stop if you develop a health problem that would prevent you from having lung cancer treatment.  Breast Cancer  Practice breast self-awareness. This means understanding how your breasts normally appear and feel.  It also means doing regular breast self-exams. Let your health care provider know about any changes, no matter how small.  If you are in your 20s or 30s, you should have a clinical breast exam (CBE) by a health care provider every 1-3 years as part of a regular health exam.  If you are 15 or older, have a CBE every year. Also consider having a breast X-ray (mammogram) every year.  If you have a family history of breast cancer, talk to your health care provider about genetic screening.  If you are  at high risk for breast cancer, talk to your health care provider about having an MRI and a mammogram every year.  Breast cancer gene (BRCA) assessment is recommended for women who have family members with BRCA-related cancers. BRCA-related cancers include:  Breast.  Ovarian.  Tubal.  Peritoneal cancers.  Results of the assessment will determine the need for genetic counseling and BRCA1 and BRCA2 testing. Cervical Cancer Routine pelvic examinations to screen for cervical cancer are no longer recommended for nonpregnant women who are considered low  risk for cancer of the pelvic organs (ovaries, uterus, and vagina) and who do not have symptoms. A pelvic examination may be necessary if you have symptoms including those associated with pelvic infections. Ask your health care provider if a screening pelvic exam is right for you.   The Pap test is the screening test for cervical cancer for women who are considered at risk.  If you had a hysterectomy for a problem that was not cancer or a condition that could lead to cancer, then you no longer need Pap tests.  If you are older than 65 years, and you have had normal Pap tests for the past 10 years, you no longer need to have Pap tests.  If you have had past treatment for cervical cancer or a condition that could lead to cancer, you need Pap tests and screening for cancer for at least 20 years after your treatment.  If you no longer get a Pap test, assess your risk factors if they change (such as having a new sexual partner). This can affect whether you should start being screened again.  Some women have medical problems that increase their chance of getting cervical cancer. If this is the case for you, your health care provider may recommend more frequent screening and Pap tests.  The human papillomavirus (HPV) test is another test that may be used for cervical cancer screening. The HPV test looks for the virus that can cause cell changes in the cervix. The cells collected during the Pap test can be tested for HPV.  The HPV test can be used to screen women 30 years of age and older. Getting tested for HPV can extend the interval between normal Pap tests from three to five years.  An HPV test also should be used to screen women of any age who have unclear Pap test results.  After 35 years of age, women should have HPV testing as often as Pap tests.  Colorectal Cancer  This type of cancer can be detected and often prevented.  Routine colorectal cancer screening usually begins at 35 years of  age and continues through 35 years of age.  Your health care provider may recommend screening at an earlier age if you have risk factors for colon cancer.  Your health care provider may also recommend using home test kits to check for hidden blood in the stool.  A small camera at the end of a tube can be used to examine your colon directly (sigmoidoscopy or colonoscopy). This is done to check for the earliest forms of colorectal cancer.  Routine screening usually begins at age 50.  Direct examination of the colon should be repeated every 5-10 years through 35 years of age. However, you may need to be screened more often if early forms of precancerous polyps or small growths are found. Skin Cancer  Check your skin from head to toe regularly.  Tell your health care provider about any new moles or changes in   moles, especially if there is a change in a mole's shape or color.  Also tell your health care provider if you have a mole that is larger than the size of a pencil eraser.  Always use sunscreen. Apply sunscreen liberally and repeatedly throughout the day.  Protect yourself by wearing long sleeves, pants, a wide-brimmed hat, and sunglasses whenever you are outside. HEART DISEASE, DIABETES, AND HIGH BLOOD PRESSURE   Have your blood pressure checked at least every 1-2 years. High blood pressure causes heart disease and increases the risk of stroke.  If you are between 69 years and 8 years old, ask your health care provider if you should take aspirin to prevent strokes.  Have regular diabetes screenings. This involves taking a blood sample to check your fasting blood sugar level.  If you are at a normal weight and have a low risk for diabetes, have this test once every three years after 35 years of age.  If you are overweight and have a high risk for diabetes, consider being tested at a younger age or more often. PREVENTING INFECTION  Hepatitis B  If you have a higher risk for  hepatitis B, you should be screened for this virus. You are considered at high risk for hepatitis B if:  You were born in a country where hepatitis B is common. Ask your health care provider which countries are considered high risk.  Your parents were born in a high-risk country, and you have not been immunized against hepatitis B (hepatitis B vaccine).  You have HIV or AIDS.  You use needles to inject street drugs.  You live with someone who has hepatitis B.  You have had sex with someone who has hepatitis B.  You get hemodialysis treatment.  You take certain medicines for conditions, including cancer, organ transplantation, and autoimmune conditions. Hepatitis C  Blood testing is recommended for:  Everyone born from [birth year] through 1966.  Anyone with known risk factors for hepatitis C. Sexually transmitted infections (STIs)  You should be screened for sexually transmitted infections (STIs) including gonorrhea and chlamydia if:  You are sexually active and are younger than 35 years of age.  You are older than 35 years of age and your health care provider tells you that you are at risk for this type of infection.  Your sexual activity has changed since you were last screened and you are at an increased risk for chlamydia or gonorrhea. Ask your health care provider if you are at risk.  If you do not have HIV, but are at risk, it may be recommended that you take a prescription medicine daily to prevent HIV infection. This is called pre-exposure prophylaxis (PrEP). You are considered at risk if:  You are sexually active and do not regularly use condoms or know the HIV status of your partner(s).  You take drugs by injection.  You are sexually active with a partner who has HIV. Talk with your health care provider about whether you are at high risk of being infected with HIV. If you choose to begin PrEP, you should first be tested for HIV. You should then be tested every 3 months for  as long as you are taking PrEP.  PREGNANCY   If you are premenopausal and you may become pregnant, ask your health care provider about preconception counseling.  If you may become pregnant, take 400 to 800 micrograms (mcg) of folic acid every day.  If you want to prevent pregnancy, talk to your  health care provider about birth control (contraception). OSTEOPOROSIS AND MENOPAUSE   Osteoporosis is a disease in which the bones lose minerals and strength with aging. This can result in serious bone fractures. Your risk for osteoporosis can be identified using a bone density scan.  If you are 35 years of age or older, or if you are at risk for osteoporosis and fractures, ask your health care provider if you should be screened.  Ask your health care provider whether you should take a calcium or vitamin D supplement to lower your risk for osteoporosis.  Menopause may have certain physical symptoms and risks.  Hormone replacement therapy may reduce some of these symptoms and risks. Talk to your health care provider about whether hormone replacement therapy is right for you.  HOME CARE INSTRUCTIONS   Schedule regular health, dental, and eye exams.  Stay current with your immunizations.   Do not use any tobacco products including cigarettes, chewing tobacco, or electronic cigarettes.  If you are pregnant, do not drink alcohol.  If you are breastfeeding, limit how much and how often you drink alcohol.  Limit alcohol intake to no more than 1 drink per day for nonpregnant women. One drink equals 12 ounces of beer, 5 ounces of wine, or 1 ounces of hard liquor.  Do not use street drugs.  Do not share needles.  Ask your health care provider for help if you need support or information about quitting drugs.  Tell your health care provider if you often feel depressed.  Tell your health care provider if you have ever been abused or do not feel safe at home. Document Released: 07/22/2010  Document Revised: 05/23/2013 Document Reviewed: 12/08/2012 [facility] Patient Information 2015 [place], [place]. This information is not intended to replace advice given to you by your health care provider. Make sure you discuss any questions you have with your health care provider. Monilial Vaginitis Vaginitis in a soreness, swelling and redness (inflammation) of the vagina and vulva. Monilial vaginitis is not a sexually transmitted infection. CAUSES  Yeast vaginitis is caused by yeast (candida) that is normally found in your vagina. With a yeast infection, the candida has overgrown in number to a point that upsets the chemical balance. SYMPTOMS   White, thick vaginal discharge.  Swelling, itching, redness and irritation of the vagina and possibly the lips of the vagina (vulva).  Burning or painful urination.  Painful intercourse. DIAGNOSIS  Things that may contribute to monilial vaginitis are:  Postmenopausal and virginal states.  Pregnancy.  Infections.  Being tired, sick or stressed, especially if you had monilial vaginitis in the past.  Diabetes. Good control will help lower the chance.  Birth control pills.  Tight fitting garments.  Using bubble bath, feminine sprays, douches or deodorant tampons.  Taking certain medications that kill germs (antibiotics).  Sporadic recurrence can occur if you become ill. TREATMENT  Your caregiver will give you medication.  There are several kinds of anti monilial vaginal creams and suppositories specific for monilial vaginitis. For recurrent yeast infections, use a suppository or cream in the vagina 2 times a week, or as directed.  Anti-monilial or steroid cream for the itching or irritation of the vulva may also be used. Get your caregiver's permission.  Painting the vagina with methylene blue solution may help if the monilial cream does not work.  Eating yogurt may help prevent monilial vaginitis. HOME CARE INSTRUCTIONS   Finish  all medication as prescribed.  Do not have sex until treatment is  completed or after your caregiver tells you it is okay.  Take warm sitz baths.  Do not douche.  Do not use tampons, especially scented ones.  Wear cotton underwear.  Avoid tight pants and panty hose.  Tell your sexual partner that you have a yeast infection. They should go to their caregiver if they have symptoms such as mild rash or itching.  Your sexual partner should be treated as well if your infection is difficult to eliminate.  Practice safer sex. Use condoms.  Some vaginal medications cause latex condoms to fail. Vaginal medications that harm condoms are:  Cleocin cream.  Butoconazole (Femstat).  Terconazole (Terazol) vaginal suppository.  Miconazole (Monistat) (may be purchased over the counter). SEEK MEDICAL CARE IF:   You have a temperature by mouth above 102 F (38.9 C).  The infection is getting worse after 2 days of treatment.  The infection is not getting better after 3 days of treatment.  You develop blisters in or around your vagina.  You develop vaginal bleeding, and it is not your menstrual period.  You have pain when you urinate.  You develop intestinal problems.  You have pain with sexual intercourse. Document Released: 10/16/2004 Document Revised: 03/31/2011 Document Reviewed: 06/30/2008 [facility] Patient Information 2015 [place], [place]. This information is not intended to replace advice given to you by your health care provider. Make sure you discuss any questions you have with your health care provider.

## 2014-02-16 LAB — URINALYSIS W MICROSCOPIC + REFLEX CULTURE
Bacteria, UA: NONE SEEN
Bilirubin Urine: NEGATIVE
CASTS: NONE SEEN
Glucose, UA: NEGATIVE mg/dL
Hgb urine dipstick: NEGATIVE
Ketones, ur: NEGATIVE mg/dL
Leukocytes, UA: NEGATIVE
NITRITE: NEGATIVE
Protein, ur: NEGATIVE mg/dL
Specific Gravity, Urine: 1.029 (ref 1.005–1.030)
Squamous Epithelial / LPF: NONE SEEN
Urobilinogen, UA: 0.2 mg/dL (ref 0.0–1.0)
pH: 5.5 (ref 5.0–8.0)

## 2014-02-16 LAB — HEMOGLOBIN A1C
Hgb A1c MFr Bld: 5.9 % — ABNORMAL HIGH (ref <5.7)
Mean Plasma Glucose: 123 mg/dL — ABNORMAL HIGH (ref <117)

## 2014-02-17 LAB — CYTOLOGY - PAP

## 2014-03-17 ENCOUNTER — Ambulatory Visit (INDEPENDENT_AMBULATORY_CARE_PROVIDER_SITE_OTHER): Payer: BC Managed Care – PPO | Admitting: Women's Health

## 2014-03-17 ENCOUNTER — Encounter: Payer: Self-pay | Admitting: Women's Health

## 2014-03-17 VITALS — BP 130/70

## 2014-03-17 DIAGNOSIS — Z113 Encounter for screening for infections with a predominantly sexual mode of transmission: Secondary | ICD-10-CM

## 2014-03-17 DIAGNOSIS — Z30018 Encounter for initial prescription of other contraceptives: Secondary | ICD-10-CM

## 2014-03-17 MED ORDER — ETONOGESTREL-ETHINYL ESTRADIOL 0.12-0.015 MG/24HR VA RING: VAGINAL_RING | VAGINAL | Status: DC

## 2014-03-17 NOTE — Progress Notes (Signed)
Patient ID: [name] [name], female   DOB: February 17, 1980, 34 y.o.   MRN: [id number] Presents to discuss contraception, new partner, condoms. Regular monthly cycle, currently on. Denies abdominal pain, vaginal discharge or urinary symptoms. 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  Exam: Appears well. External genitalia within normal limits, speculum exam moderate amount of menses type blood, GC/Chlamydia culture taken. Bimanual no CMT or tenderness noted.  STD screen Contraception management  Plan: GC/Chlamydia, HIV, hep B, C, RPR. Contraception options reviewed, NuvaRing prescription, proper use, slight risk for blood clots and strokes reviewed. Start when cycle flow is lessening, placing vagina  21-28 days, condoms especially first month and for infection control.

## 2014-03-17 NOTE — Patient Instructions (Signed)

## 2014-03-18 LAB — GC/CHLAMYDIA PROBE AMP
CT Probe RNA: NEGATIVE
GC Probe RNA: NEGATIVE

## 2014-04-13 ENCOUNTER — Telehealth: Payer: Self-pay | Admitting: *Deleted

## 2014-04-13 NOTE — Telephone Encounter (Signed)
Pt called with questions about Nuvaring.  This is her first month on birth control. She kept it in for 21 days, took it out and had white discharge, no other sx's. She placed a new one in and Nuvaring wouldn't stay in. I advised to keep it out for several days to see if discharge gets better and put it back in 4-5 days. Do not leave out more than 7 days. She will try this. She will let me know how this works for her. Advised she may start a period by leaving it out for 5-6 days.  She also questioned the price of Nuvaring - $120 for 3 month supply. She has [facility] and asked about Affordable care Act. I said ask pharmacy or look for Nuvaring coupon online, let me know if there is a break of rx and need of ring, I could supply one if we have a sample.  Pt understood and let us know if she has any other questions. KW CMA

## 2014-05-19 ENCOUNTER — Telehealth: Payer: Self-pay

## 2014-05-19 NOTE — Telephone Encounter (Signed)
Left message she has question about Nuvaring.  She had spoken with Sherrilyn RistKari previously about having to discard one of the rings in her 3 mos supply. She called to let us know she will need a sample to get her to the next refill. Sample provided.

## 2014-08-03 ENCOUNTER — Telehealth: Payer: Self-pay | Admitting: *Deleted

## 2014-08-03 NOTE — Telephone Encounter (Signed)
Ok, loestrin 1/20 take daily, start after finishing current month on nuva ring.

## 2014-08-03 NOTE — Telephone Encounter (Signed)
Pt called requesting to switch from nuvaring to birth control pills. Pt said the nuvaring is not working for her, uncomfortable. Pt aware you are out of the office. Please advise

## 2014-08-07 MED ORDER — NORETHINDRONE ACET-ETHINYL EST 1-20 MG-MCG PO TABS: 1.0000 | ORAL_TABLET | Freq: Every day | ORAL | Status: DC

## 2014-08-07 NOTE — Telephone Encounter (Signed)
PT AWARE RX SENT 

## 2015-01-04 ENCOUNTER — Other Ambulatory Visit: Payer: Self-pay

## 2015-01-04 MED ORDER — NORETHINDRONE ACET-ETHINYL EST 1-20 MG-MCG PO TABS: 1.0000 | ORAL_TABLET | Freq: Every day | ORAL | Status: DC

## 2015-02-19 ENCOUNTER — Other Ambulatory Visit: Payer: Self-pay

## 2015-02-19 MED ORDER — NORETHINDRONE ACET-ETHINYL EST 1-20 MG-MCG PO TABS: 1.0000 | ORAL_TABLET | Freq: Every day | ORAL | Status: DC

## 2015-03-09 ENCOUNTER — Other Ambulatory Visit: Payer: Self-pay | Admitting: *Deleted

## 2015-03-09 MED ORDER — NORETHINDRONE ACET-ETHINYL EST 1-20 MG-MCG PO TABS: 1.0000 | ORAL_TABLET | Freq: Every day | ORAL | Status: DC

## 2015-03-22 ENCOUNTER — Encounter: Payer: Self-pay | Admitting: Women's Health

## 2015-03-22 ENCOUNTER — Ambulatory Visit (INDEPENDENT_AMBULATORY_CARE_PROVIDER_SITE_OTHER): Payer: BC Managed Care – PPO | Admitting: Women's Health

## 2015-03-22 VITALS — BP 124/78 | Ht 70.0 in | Wt 200.0 lb

## 2015-03-22 DIAGNOSIS — N898 Other specified noninflammatory disorders of vagina: Secondary | ICD-10-CM | POA: Diagnosis not present

## 2015-03-22 DIAGNOSIS — Z01419 Encounter for gynecological examination (general) (routine) without abnormal findings: Secondary | ICD-10-CM | POA: Diagnosis not present

## 2015-03-22 DIAGNOSIS — Z3041 Encounter for surveillance of contraceptive pills: Secondary | ICD-10-CM | POA: Diagnosis not present

## 2015-03-22 DIAGNOSIS — Z1329 Encounter for screening for other suspected endocrine disorder: Secondary | ICD-10-CM

## 2015-03-22 DIAGNOSIS — Z833 Family history of diabetes mellitus: Secondary | ICD-10-CM | POA: Diagnosis not present

## 2015-03-22 LAB — CBC WITH DIFFERENTIAL/PLATELET
BASOS PCT: 0 % (ref 0–1)
Basophils Absolute: 0 10*3/uL (ref 0.0–0.1)
EOS PCT: 4 % (ref 0–5)
Eosinophils Absolute: 0.2 10*3/uL (ref 0.0–0.7)
HEMATOCRIT: 35.4 % — AB (ref 36.0–46.0)
Hemoglobin: 10.6 g/dL — ABNORMAL LOW (ref 12.0–15.0)
LYMPHS PCT: 36 % (ref 12–46)
Lymphs Abs: 1.7 10*3/uL (ref 0.7–4.0)
MCH: 23.5 pg — ABNORMAL LOW (ref 26.0–34.0)
MCHC: 29.9 g/dL — AB (ref 30.0–36.0)
MCV: 78.3 fL (ref 78.0–100.0)
MPV: 9.7 fL (ref 8.6–12.4)
Monocytes Absolute: 0.7 10*3/uL (ref 0.1–1.0)
Monocytes Relative: 15 % — ABNORMAL HIGH (ref 3–12)
Neutro Abs: 2.1 10*3/uL (ref 1.7–7.7)
Neutrophils Relative %: 45 % (ref 43–77)
Platelets: 366 10*3/uL (ref 150–400)
RBC: 4.52 MIL/uL (ref 3.87–5.11)
RDW: 15.7 % — AB (ref 11.5–15.5)
WBC: 4.7 10*3/uL (ref 4.0–10.5)

## 2015-03-22 LAB — URINALYSIS W MICROSCOPIC + REFLEX CULTURE
Bilirubin Urine: NEGATIVE
CASTS: NONE SEEN [LPF]
Crystals: NONE SEEN [HPF]
Glucose, UA: NEGATIVE
Hgb urine dipstick: NEGATIVE
Ketones, ur: NEGATIVE
Leukocytes, UA: NEGATIVE
Nitrite: NEGATIVE
RBC / HPF: NONE SEEN RBC/HPF (ref <=2)
Specific Gravity, Urine: 1.015 (ref 1.001–1.035)
Yeast: NONE SEEN [HPF]
pH: 7.5 (ref 5.0–8.0)

## 2015-03-22 LAB — HEMOGLOBIN A1C
Hgb A1c MFr Bld: 6.3 % — ABNORMAL HIGH (ref <5.7)
MEAN PLASMA GLUCOSE: 134 mg/dL — AB (ref <117)

## 2015-03-22 LAB — WET PREP FOR TRICH, YEAST, CLUE
Clue Cells Wet Prep HPF POC: NONE SEEN
TRICH WET PREP: NONE SEEN
Yeast Wet Prep HPF POC: NONE SEEN

## 2015-03-22 LAB — TSH: TSH: 2.01 mIU/L

## 2015-03-22 MED ORDER — NORETHIN-ETH ESTRAD-FE BIPHAS 1 MG-10 MCG / 10 MCG PO TABS: 1.0000 | ORAL_TABLET | Freq: Every day | ORAL | Status: DC

## 2015-03-22 NOTE — Patient Instructions (Addendum)
Health Maintenance, Female Adopting a healthy lifestyle and getting preventive care can go a long way to promote health and wellness. Talk with your health care provider about what schedule of regular examinations is right for you. This is a good chance for you to check in with your provider about disease prevention and staying healthy. In between checkups, there are plenty of things you can do on your own. Experts have done a lot of research about which lifestyle changes and preventive measures are most likely to keep you healthy. Ask your health care provider for more information. WEIGHT AND DIET  Eat a healthy diet  Be sure to include plenty of vegetables, fruits, low-fat dairy products, and lean protein.  Do not eat a lot of foods high in solid fats, added sugars, or salt.  Get regular exercise. This is one of the most important things you can do for your health.  Most adults should exercise for at least 150 minutes each week. The exercise should increase your heart rate and make you sweat (moderate-intensity exercise).  Most adults should also do strengthening exercises at least twice a week. This is in addition to the moderate-intensity exercise.  Maintain a healthy weight  Body mass index (BMI) is a measurement that can be used to identify possible weight problems. It estimates body fat based on height and weight. Your health care provider can help determine your BMI and help you achieve or maintain a healthy weight.  For females 20 years of age and older:   A BMI below 18.5 is considered underweight.  A BMI of 18.5 to 24.9 is normal.  A BMI of 25 to 29.9 is considered overweight.  A BMI of 30 and above is considered obese.  Watch levels of cholesterol and blood lipids  You should start having your blood tested for lipids and cholesterol at 36 years of age, then have this test every 5 years.  You may need to have your cholesterol levels checked more often if:  Your lipid  or cholesterol levels are high.  You are older than 36 years of age.  You are at high risk for heart disease.  CANCER SCREENING   Lung Cancer  Lung cancer screening is recommended for adults 55-80 years old who are at high risk for lung cancer because of a history of smoking.  A yearly low-dose CT scan of the lungs is recommended for people who:  Currently smoke.  Have quit within the past 15 years.  Have at least a 30-pack-year history of smoking. A pack year is smoking an average of one pack of cigarettes a day for 1 year.  Yearly screening should continue until it has been 15 years since you quit.  Yearly screening should stop if you develop a health problem that would prevent you from having lung cancer treatment.  Breast Cancer  Practice breast self-awareness. This means understanding how your breasts normally appear and feel.  It also means doing regular breast self-exams. Let your health care provider know about any changes, no matter how small.  If you are in your 20s or 30s, you should have a clinical breast exam (CBE) by a health care provider every 1-3 years as part of a regular health exam.  If you are 40 or older, have a CBE every year. Also consider having a breast X-ray (mammogram) every year.  If you have a family history of breast cancer, talk to your health care provider about genetic screening.  If you   are at high risk for breast cancer, talk to your health care provider about having an MRI and a mammogram every year.  Breast cancer gene (BRCA) assessment is recommended for women who have family members with BRCA-related cancers. BRCA-related cancers include:  Breast.  Ovarian.  Tubal.  Peritoneal cancers.  Results of the assessment will determine the need for genetic counseling and BRCA1 and BRCA2 testing. Cervical Cancer Your health care provider may recommend that you be screened regularly for cancer of the pelvic organs (ovaries, uterus, and  vagina). This screening involves a pelvic examination, including checking for microscopic changes to the surface of your cervix (Pap test). You may be encouraged to have this screening done every 3 years, beginning at age 21.  For women ages 30-65, health care providers may recommend pelvic exams and Pap testing every 3 years, or they may recommend the Pap and pelvic exam, combined with testing for human papilloma virus (HPV), every 5 years. Some types of HPV increase your risk of cervical cancer. Testing for HPV may also be done on women of any age with unclear Pap test results.  Other health care providers may not recommend any screening for nonpregnant women who are considered low risk for pelvic cancer and who do not have symptoms. Ask your health care provider if a screening pelvic exam is right for you.  If you have had past treatment for cervical cancer or a condition that could lead to cancer, you need Pap tests and screening for cancer for at least 20 years after your treatment. If Pap tests have been discontinued, your risk factors (such as having a new sexual partner) need to be reassessed to determine if screening should resume. Some women have medical problems that increase the chance of getting cervical cancer. In these cases, your health care provider may recommend more frequent screening and Pap tests. Colorectal Cancer  This type of cancer can be detected and often prevented.  Routine colorectal cancer screening usually begins at 36 years of age and continues through 36 years of age.  Your health care provider may recommend screening at an earlier age if you have risk factors for colon cancer.  Your health care provider may also recommend using home test kits to check for hidden blood in the stool.  A small camera at the end of a tube can be used to examine your colon directly (sigmoidoscopy or colonoscopy). This is done to check for the earliest forms of colorectal  cancer.  Routine screening usually begins at age 50.  Direct examination of the colon should be repeated every 5-10 years through 36 years of age. However, you may need to be screened more often if early forms of precancerous polyps or small growths are found. Skin Cancer  Check your skin from head to toe regularly.  Tell your health care provider about any new moles or changes in moles, especially if there is a change in a mole's shape or color.  Also tell your health care provider if you have a mole that is larger than the size of a pencil eraser.  Always use sunscreen. Apply sunscreen liberally and repeatedly throughout the day.  Protect yourself by wearing long sleeves, pants, a wide-brimmed hat, and sunglasses whenever you are outside. HEART DISEASE, DIABETES, AND HIGH BLOOD PRESSURE   High blood pressure causes heart disease and increases the risk of stroke. High blood pressure is more likely to develop in:  People who have blood pressure in the high end   of the normal range (130-139/85-89 mm Hg).  People who are overweight or obese.  People who are African American.  If you are 38-23 years of age, have your blood pressure checked every 3-5 years. If you are 61 years of age or older, have your blood pressure checked every year. You should have your blood pressure measured twice--once when you are at a hospital or clinic, and once when you are not at a hospital or clinic. Record the average of the two measurements. To check your blood pressure when you are not at a hospital or clinic, you can use:  An automated blood pressure machine at a pharmacy.  A home blood pressure monitor.  If you are between 45 years and 39 years old, ask your health care provider if you should take aspirin to prevent strokes.  Have regular diabetes screenings. This involves taking a blood sample to check your fasting blood sugar level.  If you are at a normal weight and have a low risk for diabetes,  have this test once every three years after 36 years of age.  If you are overweight and have a high risk for diabetes, consider being tested at a younger age or more often. PREVENTING INFECTION  Hepatitis B  If you have a higher risk for hepatitis B, you should be screened for this virus. You are considered at high risk for hepatitis B if:  You were born in a country where hepatitis B is common. Ask your health care provider which countries are considered high risk.  Your parents were born in a high-risk country, and you have not been immunized against hepatitis B (hepatitis B vaccine).  You have HIV or AIDS.  You use needles to inject street drugs.  You live with someone who has hepatitis B.  You have had sex with someone who has hepatitis B.  You get hemodialysis treatment.  You take certain medicines for conditions, including cancer, organ transplantation, and autoimmune conditions. Hepatitis C  Blood testing is recommended for:  Everyone born from [birth year] through 1966.  Anyone with known risk factors for hepatitis C. Sexually transmitted infections (STIs)  You should be screened for sexually transmitted infections (STIs) including gonorrhea and chlamydia if:  You are sexually active and are younger than 36 years of age.  You are older than 36 years of age and your health care provider tells you that you are at risk for this type of infection.  Your sexual activity has changed since you were last screened and you are at an increased risk for chlamydia or gonorrhea. Ask your health care provider if you are at risk.  If you do not have HIV, but are at risk, it may be recommended that you take a prescription medicine daily to prevent HIV infection. This is called pre-exposure prophylaxis (PrEP). You are considered at risk if:  You are sexually active and do not regularly use condoms or know the HIV status of your partner(s).  You take drugs by injection.  You are sexually  active with a partner who has HIV. Talk with your health care provider about whether you are at high risk of being infected with HIV. If you choose to begin PrEP, you should first be tested for HIV. You should then be tested every 3 months for as long as you are taking PrEP.  PREGNANCY   If you are premenopausal and you may become pregnant, ask your health care provider about preconception counseling.  If you may  become pregnant, take 400 to 800 micrograms (mcg) of folic acid every day.  If you want to prevent pregnancy, talk to your health care provider about birth control (contraception). OSTEOPOROSIS AND MENOPAUSE   Osteoporosis is a disease in which the bones lose minerals and strength with aging. This can result in serious bone fractures. Your risk for osteoporosis can be identified using a bone density scan.  If you are 65 years of age or older, or if you are at risk for osteoporosis and fractures, ask your health care provider if you should be screened.  Ask your health care provider whether you should take a calcium or vitamin D supplement to lower your risk for osteoporosis.  Menopause may have certain physical symptoms and risks.  Hormone replacement therapy may reduce some of these symptoms and risks. Talk to your health care provider about whether hormone replacement therapy is right for you.  HOME CARE INSTRUCTIONS   Schedule regular health, dental, and eye exams.  Stay current with your immunizations.   Do not use any tobacco products including cigarettes, chewing tobacco, or electronic cigarettes.  If you are pregnant, do not drink alcohol.  If you are breastfeeding, limit how much and how often you drink alcohol.  Limit alcohol intake to no more than 1 drink per day for nonpregnant women. One drink equals 12 ounces of beer, 5 ounces of wine, or 1 ounces of hard liquor.  Do not use street drugs.  Do not share needles.  Ask your health care provider for help if  you need support or information about quitting drugs.  Tell your health care provider if you often feel depressed.  Tell your health care provider if you have ever been abused or do not feel safe at home.   This information is not intended to replace advice given to you by your health care provider. Make sure you discuss any questions you have with your health care provider.   Document Released: 07/22/2010 Document Revised: 01/27/2014 Document Reviewed: 12/08/2012 Elsevier Interactive Patient Education 2016 Elsevier Inc[employer] Intrauterine Device Information An intrauterine device (IUD) is inserted into your uterus to prevent pregnancy. There are two types of IUDs available:   Copper IUD--This type of IUD is wrapped in copper wire and is placed inside the uterus. Copper makes the uterus and fallopian tubes produce a fluid that kills sperm. The copper IUD can stay in place for 10 years.  Hormone IUD--This type of IUD contains the hormone progestin (synthetic progesterone). The hormone thickens the cervical mucus and prevents sperm from entering the uterus. It also thins the uterine lining to prevent implantation of a fertilized egg. The hormone can weaken or kill the sperm that get into the uterus. One type of hormone IUD can stay in place for 5 years, and another type can stay in place for 3 years. Your health care provider will make sure you are a good candidate for a contraceptive IUD. Discuss with your health care provider the possible side effects.  ADVANTAGES OF AN INTRAUTERINE DEVICE  IUDs are highly effective, reversible, long acting, and low maintenance.   There are no estrogen-related side effects.   An IUD can be used when breastfeeding.   IUDs are not associated with weight gain.   The copper IUD works immediately after insertion.   The hormone IUD works right away if inserted within 7 days of your period starting. You will need to use a backup method of birth control for 7  days if   the hormone IUD is inserted at any other time in your cycle.  The copper IUD does not interfere with your female hormones.   The hormone IUD can make heavy menstrual periods lighter and decrease cramping.   The hormone IUD can be used for 3 or 5 years.   The copper IUD can be used for 10 years. DISADVANTAGES OF AN INTRAUTERINE DEVICE  The hormone IUD can be associated with irregular bleeding patterns.   The copper IUD can make your menstrual flow heavier and more painful.   You may experience cramping and vaginal bleeding after insertion.    This information is not intended to replace advice given to you by your health care provider. Make sure you discuss any questions you have with your health care provider.   Document Released: 12/11/2003 Document Revised: 09/08/2012 Document Reviewed: 06/27/2012 Elsevier Interactive Patient Education 2016 Elsevier Inc.  

## 2015-03-22 NOTE — Progress Notes (Signed)
[name] [name] 1980-02-20 [id number]    History:    Presents for annual exam.  Light cycle on Loestrin but complains of a 15 pound weight gain since starting. Same partner. 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 Normal Pap history. Hemoglobin A1c 5.9 2016.  Past medical history, past surgical history, family history and social history were all reviewed and documented in the EPIC chart. Curriculum coordinator, starting a PhD program.  ROS:  A ROS was performed and pertinent positives and negatives are included.  Exam:  Filed Vitals:   03/22/15 0830  BP: 124/78    General appearance:  Normal Thyroid:  Symmetrical, normal in size, without palpable masses or nodularity. Respiratory  Auscultation:  Clear without wheezing or rhonchi Cardiovascular  Auscultation:  Regular rate, without rubs, murmurs or gallops  Edema/varicosities:  Not grossly evident Abdominal  Soft,nontender, without masses, guarding or rebound.  Liver/spleen:  No organomegaly noted  Hernia:  None appreciated  Skin  Inspection:  Grossly normal   Breasts: Examined lying and sitting.     Right: Without masses, retractions, discharge or axillary adenopathy.     Left: Without masses, retractions, discharge or axillary adenopathy. Gentitourinary   Inguinal/mons:  Normal without inguinal adenopathy  External genitalia:  Normal  BUS/Urethra/Skene's glands:  Normal  Vagina:  Normal Wet prep negative  Cervix:  Normal  Uterus: normal in size, shape and contour.  Midline and mobile  Adnexa/parametria:     Rt: Without masses or tenderness.   Lt: Without masses or tenderness.  Anus and perineum: Normal  Digital rectal exam: Normal sphincter tone without palpated masses or tenderness  Assessment/Plan:  35 y.o. SBF G0 for annual exam with complaint of increased vaginal discharge.     Light monthly cycle on Loestrin 15 pound weight gain since starting Loestrin 2014 gastric bypass currently on [employer] History  type 2 diabetes resolved after weight loss  Plan: Options reviewed, information given and reviewed Mirena IUD, reviewed placed with cycle by Dr. Lily Peer. Will call if decides to proceed with Mirena IUD. Will try lo Loestrin prescription, proper use, slight risk for blood clots and strokes reviewed. Continue Weight Watchers for weight loss. SBE's, regular exercise, calcium rich diet, MVI daily encouraged. Reviewed normality of wet prep and discharge appearance. CBC, TSH, hemoglobin A1c, UA, Pap normal 2016, new screening guidelines reviewed.    Harrington Challenger [name] [name], 9:26 AM 03/22/2015

## 2015-03-23 ENCOUNTER — Other Ambulatory Visit: Payer: Self-pay | Admitting: Women's Health

## 2015-03-23 DIAGNOSIS — D649 Anemia, unspecified: Secondary | ICD-10-CM

## 2015-03-23 DIAGNOSIS — R7309 Other abnormal glucose: Secondary | ICD-10-CM

## 2015-03-23 LAB — URINE CULTURE
Colony Count: NO GROWTH
Organism ID, Bacteria: NO GROWTH

## 2015-03-26 ENCOUNTER — Telehealth: Payer: Self-pay | Admitting: *Deleted

## 2015-03-26 MED ORDER — NORETHINDRONE ACET-ETHINYL EST 1-20 MG-MCG PO TABS: 1.0000 | ORAL_TABLET | Freq: Every day | ORAL | Status: DC

## 2015-03-26 NOTE — Telephone Encounter (Signed)
Yes, please refill for pt. Let her know TSH was normal, thyroid not causing wt gain.  She just started Weight watchers which I think will help with her wt.

## 2015-03-26 NOTE — Telephone Encounter (Signed)
Pt informed with the below note. Rx sent for 3 month supply

## 2015-03-26 NOTE — Telephone Encounter (Signed)
Pt was prescribed lo loestrin fe on OV 03/22/15 medication is not covered with insurance, pt asked if she could have Junel. Please advise

## 2015-04-23 ENCOUNTER — Other Ambulatory Visit: Payer: Self-pay

## 2015-04-23 MED ORDER — NORETHINDRONE ACET-ETHINYL EST 1-20 MG-MCG PO TABS: 1.0000 | ORAL_TABLET | Freq: Every day | ORAL | Status: DC

## 2015-10-01 ENCOUNTER — Encounter: Payer: Self-pay | Admitting: Dietician

## 2015-10-01 ENCOUNTER — Encounter: Payer: BC Managed Care – PPO | Attending: General Surgery | Admitting: Dietician

## 2015-10-01 DIAGNOSIS — Z713 Dietary counseling and surveillance: Secondary | ICD-10-CM | POA: Diagnosis present

## 2015-10-01 DIAGNOSIS — Z9884 Bariatric surgery status: Secondary | ICD-10-CM | POA: Insufficient documentation

## 2015-10-01 NOTE — Patient Instructions (Addendum)
Goals: -Feel good! -Make better choices (food choices and portion size) -HgbA1c in normal range   -Increase water intake  -Avoid all drinks with sugar and carbonation  -Cut up fruit in water  -Limit fruit to 2-3 servings per day   -Pair a protein with your fruit  -boiled egg, nuts, deli meat+cheese, edamame  -Keep some Lean Cuisines, Healthy Choice, and Smart Ones at home instead of going out to eat  -Aim for >14 g protein and <25 grams carbs -Think about doing some meal prep on Sundays  Start taking supplements again! -Avoid taking your Calcium and Iron together (at least 2 hours apart!) -3 Calcium per day at least 2 hours apart -Try Bariatric Advantage soft chews (Calcium and MVI) and get a separate iron (45-60 mg) supplement -Try Celebrate iron soft chewy bite

## 2015-10-01 NOTE — Progress Notes (Signed)
Follow-up visit: 3 years Post-Operative RYGB Surgery  Medical Nutrition Therapy:  Appt start time: 250 end time:  345  Primary concerns today: Post-operative Bariatric Surgery Nutrition Management.    [name] [name] is here today for bariatric nutrition follow up. She has experienced some weight regain but ultimately has maintained a weight loss of over 100 lbs since RYGB surgery 3 years ago. She states that she makes good choices and also makes bad choices. States that she follows a "plan" well and liked having a strict diet in the months following her surgery. Nervous that she will gain her weight back. Continues to be limited in the amount/volume of food that she can tolerate, about 3-4 oz at a time. She reports she has been hovering around 200 lbs for a few months. Lowest weight was 185 lbs post op. Feels like she is obsessed with her weight and clothing size (numbers). Reports that her doctor says she is back in prediabetes range (HgbA1c 5.9%) and would like her to lose 15 pounds to try to control her blood sugars. [name] [name] reports she tried [employer] and only lost 4 pounds so she quit. She is currently working on her PhD and also working full time; has a very busy schedule and has been unable to exercise as much. [name] [name] reports that she would like to discuss an exercise plan at a later time. She sees a Veterinary surgeon regularly.   Samples provided and patient instructed on proper use:  Bariatric Advantage Calcium chew (strawberry - qty 2) Lot#: [id number] Exp: 05/2016  Bariatric Advantage multivitamin chew (strawberry-watermelon - qty 2) Lot#: [id number] Exp: 04/2016  Bariatric Advantage multivitamin chew (dark cherry - qty 2) Lot#: [id number] Exp:04/2016  Surgery date: 08/24/12 Surgery type: RYGB Start weight at [facility]:  307.8 lbs (03/24/12) Weight today: 203 lbs Weight change: 19 lbs regain Total weight lost: 105 lbs  Goal: under 200 lbs and size 10  TANITA  BODY COMP RESULTS  08/12/12 09/07/12 10/20/12  02/02/13 05/03/13 10/12/13 10/01/15   BMI (kg/m^2) 44.9 40.9 38.1 31.2 28.2 26.8 29.2   Fat Mass (lbs) 168.0 153.5 126.5 95.5 75.5 68.0 83.2   Fat Free Mass (lbs) 140.5 127.5 131.5 119.0 118.5 116.0 120.6   Total Body Water (lbs) 103.0 93.5 96.5 87.0 87 85.0 87.2   24-hr recall: B (AM): Premier protein shake and banana OR boiled egg and 1 packet of oatmeal (30g) Snk (AM): fruit OR Special K pastry pack L (12 PM): scoop of chicken salad and berries or crackers  Snk (3 PM): rest of lunch, clementine D (PM): [place] food: fried rice, honey chicken OR rice, chicken, and vegetables with tortilla Snk (PM): clementine  Fluid intake: 11 oz protein shake most days, mainly water, sometimes Capri Sun, decaf coffee Estimated total protein intake:  ~80 g (some days better than other)  Medications: Off all DM and BP meds Supplementation: No taking regularly  CBG monitoring: Once weekly Last patient reported A1c: no new Average BG: 80-110  Using straws: Sometimes Drinking while eating: No Hair loss: No Carbonated beverages: No N/V/D/C: none Dumping syndrome: no  Recent physical activity:  None currently  Progress Towards Goal(s):  In progress.  Handouts provided: Bariatric snack ideas and Pre op diet  Nutritional Diagnosis:  [place]-3.3 Overweight/obesity related to past poor dietary habits and physical inactivity as evidenced by patient w/ recent RYGB surgery following dietary guidelines for weight maintenance.    Intervention:  Nutrition education/diet reinforcement.  Goals: -Feel good! -Make better choices (food choices and  portion size) -HgbA1c in normal range   -Increase water intake  -Avoid all drinks with sugar and carbonation  -Cut up fruit in water -Limit fruit to 2-3 servings per day  -Pair a protein with your fruit  -boiled egg, nuts, deli meat+cheese, edamame -Keep some Lean Cuisines, Healthy Choice, and Smart Ones at home instead of going out to eat  -Aim for >14 g protein  and <25 grams carbs -Think about doing some meal prep on Sundays  Start taking supplements again! -Avoid taking your Calcium and Iron together (at least 2 hours apart!) -3 Calcium per day at least 2 hours apart -Try Bariatric Advantage soft chews (Calcium and MVI) and get a separate iron (45-60 mg) supplement -Try Celebrate iron soft chewy bite  Monitoring/Evaluation:  Dietary intake, exercise, and body weight. Follow up prn.

## 2015-10-31 ENCOUNTER — Encounter: Payer: Self-pay | Admitting: Women's Health

## 2015-10-31 ENCOUNTER — Ambulatory Visit (INDEPENDENT_AMBULATORY_CARE_PROVIDER_SITE_OTHER): Payer: BC Managed Care – PPO | Admitting: Women's Health

## 2015-10-31 VITALS — BP 122/80 | Ht 70.0 in | Wt 202.0 lb

## 2015-10-31 DIAGNOSIS — Z30011 Encounter for initial prescription of contraceptive pills: Secondary | ICD-10-CM | POA: Diagnosis not present

## 2015-10-31 DIAGNOSIS — Z113 Encounter for screening for infections with a predominantly sexual mode of transmission: Secondary | ICD-10-CM | POA: Diagnosis not present

## 2015-10-31 MED ORDER — NORETHINDRONE ACET-ETHINYL EST 1-20 MG-MCG PO TABS: 1.0000 | ORAL_TABLET | Freq: Every day | ORAL | 3 refills | Status: DC

## 2015-10-31 NOTE — Progress Notes (Signed)
Presents with need for STD testing. Does not have a current partner and is currently not sexually active but states she had unprotected intercourse a few months ago.  Denies abdominal pain, vaginal odor, discharge, or itchiness. Admits to stopping Loestrin, but requests to restart.  No DVT history.  Exam: Appears well. External genitalia normal. Speculum exam  scant white discharge without erythema or odor noted, no pain with exam. CG/Chlamydia taken.   Screen for STD Contraception Management   Plan: ECC/Chlamydia culture pending, HIV, hep B, C, RPR. Discussed use of condoms until permanent partner.  Loestrin 1/20 prescription and instructed on proper use. Agreeable to the plan of care.

## 2015-11-01 ENCOUNTER — Encounter: Payer: Self-pay | Admitting: Women's Health

## 2015-11-01 LAB — HIV ANTIBODY (ROUTINE TESTING W REFLEX): HIV 1&2 Ab, 4th Generation: NONREACTIVE

## 2015-11-01 LAB — GC/CHLAMYDIA PROBE AMP
CT Probe RNA: NOT DETECTED
GC Probe RNA: NOT DETECTED

## 2015-11-01 LAB — RPR

## 2015-11-01 LAB — HEPATITIS B SURFACE ANTIGEN: Hepatitis B Surface Ag: NEGATIVE

## 2015-11-01 LAB — HEPATITIS C ANTIBODY: HCV Ab: NEGATIVE

## 2015-12-21 ENCOUNTER — Telehealth: Payer: Self-pay | Admitting: *Deleted

## 2015-12-21 NOTE — Telephone Encounter (Signed)
Pt informed with the below note. 

## 2015-12-21 NOTE — Telephone Encounter (Signed)
Pt takes Junel 1/20 she takes 3 weeks of active pills and takes a week off and starts a new pack of pill. Pt is in a relationship so she is sexually active now, asked when is she "safe" not to use back up contraception. Okay to not use condoms during pill free week? Please advise

## 2015-12-21 NOTE — Telephone Encounter (Signed)
She is protected for contraception since she has been on the pill for greater than one month okay during placebo week also for contraception. Condoms still good idea for infection control. Or can come for an office visit for STD screen to be sure.

## 2016-02-28 ENCOUNTER — Ambulatory Visit (HOSPITAL_COMMUNITY)
Admission: EM | Admit: 2016-02-28 | Discharge: 2016-02-28 | Disposition: A | Discharge status: Home / Self Care | Payer: BC Managed Care – PPO | Attending: Emergency Medicine | Admitting: Emergency Medicine

## 2016-02-28 ENCOUNTER — Encounter (HOSPITAL_COMMUNITY): Payer: Self-pay | Admitting: Emergency Medicine

## 2016-02-28 ENCOUNTER — Ambulatory Visit (INDEPENDENT_AMBULATORY_CARE_PROVIDER_SITE_OTHER): Payer: BC Managed Care – PPO

## 2016-02-28 DIAGNOSIS — R1010 Upper abdominal pain, unspecified: Secondary | ICD-10-CM

## 2016-02-28 DIAGNOSIS — K5900 Constipation, unspecified: Secondary | ICD-10-CM | POA: Diagnosis not present

## 2016-02-28 LAB — POCT PREGNANCY, URINE: Preg Test, Ur: NEGATIVE

## 2016-02-28 MED ORDER — MAGNESIUM CITRATE PO SOLN: 1.0000 | Freq: Once | ORAL | 1 refills | Status: AC

## 2016-02-28 NOTE — ED Provider Notes (Signed)
CSN: [id number]     Arrival date & time 02/28/16  1427 History   First MD Initiated Contact with Patient 02/28/16 1607     Chief Complaint  Patient presents with  . Abdominal Pain   (Consider location/radiation/quality/duration/timing/severity/associated sxs/prior Treatment) Patient c/o abdominal pain starting today.  She states abdominal pain is throughout he abdomen but worse in upper abdomen.  She states she has been passing gas.  She has had 2 normal bm's today.  She denies fever.     The history is provided by the patient.  Abdominal Pain  Pain location:  Generalized Pain quality: aching   Pain radiates to:  Does not radiate Pain severity:  Moderate Onset quality:  Sudden Duration:  1 day Timing:  Constant Progression:  Worsening Chronicity:  New Relieved by:  Nothing Worsened by:  Nothing Ineffective treatments:  None tried   Past Medical History:  Diagnosis Date  . Diabetes mellitus 05/2008   DR. BALAN  . GERD (gastroesophageal reflux disease)    after taking antibiotics it went away  . Hypertension   . Obesity    Past Surgical History:  Procedure Laterality Date  . BREATH TEK H PYLORI N/A 08/20/2012   Procedure: BREATH TEK H PYLORI;  Surgeon: Lodema PilotBrian Layton, DO;  Location: [facility];  Service: Endoscopy;  Laterality: N/A;  . GASTRIC ROUX-EN-Y N/A 08/24/2012   Procedure: LAPAROSCOPIC ROUX-EN-Y GASTRIC BYPASS WITH UPPER ENDOSCOPY;  Surgeon: Lodema PilotBrian Layton, DO;  Location: [facility];  Service: General;  Laterality: N/A;  . NO PAST SURGERIES     Family History  Problem Relation Age of Onset  . Diabetes Mother   . Hypertension Mother   . Diabetes Maternal Aunt   . Diabetes Maternal Grandfather    Social History  Substance Use Topics  . Smoking status: Never Smoker  . Smokeless tobacco: Never Used  . Alcohol use No   OB History    Gravida Para Term Preterm AB Living   0 0 0 0 0 0   SAB TAB Ectopic Multiple Live Births   0 0 0 0       Review of Systems   Constitutional: Negative.   HENT: Negative.   Eyes: Negative.   Respiratory: Negative.   Cardiovascular: Negative.   Gastrointestinal: Positive for abdominal pain.  Endocrine: Negative.   Genitourinary: Negative.   Musculoskeletal: Negative.   Allergic/Immunologic: Negative.   Hematological: Negative.   Psychiatric/Behavioral: Negative.     Allergies  Metformin and related  Home Medications   Prior to Admission medications   Medication Sig Start Date End Date Taking? Authorizing Provider  norethindrone-ethinyl estradiol (MICROGESTIN,JUNEL,LOESTRIN) 1-20 MG-MCG tablet Take 1 tablet by mouth daily. 10/31/15  Yes Harrington ChallengerNancy J Young, NP  Multiple Vitamins-Minerals (MULTI-VITAMIN GUMMIES PO) Take 2 tablets by mouth daily.    Historical Provider, MD   Meds Ordered and Administered this Visit  Medications - No data to display  BP 110/74 (BP Location: Right Arm)   Pulse 96   Temp 97.8 F (36.6 C) (Oral)   Resp 18   LMP 12/28/2015   SpO2 100%  No data found.   Physical Exam  Constitutional: She appears well-developed and well-nourished.  HENT:  Head: Normocephalic and atraumatic.  Right Ear: External ear normal.  Left Ear: External ear normal.  Mouth/Throat: Oropharynx is clear and moist.  Eyes: Conjunctivae and EOM are normal. Pupils are equal, round, and reactive to light.  Neck: Normal range of motion. Neck supple.  Cardiovascular: Normal rate, regular rhythm  and normal heart sounds.   Pulmonary/Chest: Effort normal and breath sounds normal.  Abdominal: Soft. She exhibits distension. There is tenderness.  Bowel sounds hypoactive x 4.  Nursing note and vitals reviewed.   Urgent Care Course     Procedures (including critical care time)  Labs Review Labs Reviewed - No data to display  Imaging Review No results found.   Visual Acuity Review  Right Eye Distance:   Left Eye Distance:   Bilateral Distance:    Right Eye Near:   Left Eye Near:    Bilateral  Near:         MDM   1. Pain of upper abdomen    Abdomen xray - constipation  Magnesium citrate one po today #144ml/1 rf  Push po fluids.    Deatra Canter, FNP 02/28/16 [phone]

## 2016-02-28 NOTE — ED Triage Notes (Signed)
Here for constant abd pain onset 1400   LBM = today   States pain is subsiding since she's been here  Denies fevers, urinary sx.   A&O x4... NAD

## 2016-03-26 ENCOUNTER — Encounter (HOSPITAL_COMMUNITY): Payer: Self-pay

## 2016-03-28 DIAGNOSIS — Z0289 Encounter for other administrative examinations: Secondary | ICD-10-CM

## 2016-06-04 ENCOUNTER — Encounter: Payer: Self-pay | Admitting: Gynecology

## 2016-07-16 ENCOUNTER — Ambulatory Visit (INDEPENDENT_AMBULATORY_CARE_PROVIDER_SITE_OTHER): Payer: BC Managed Care – PPO | Admitting: Women's Health

## 2016-07-16 ENCOUNTER — Encounter: Payer: Self-pay | Admitting: Women's Health

## 2016-07-16 VITALS — BP 130/80

## 2016-07-16 DIAGNOSIS — R5381 Other malaise: Secondary | ICD-10-CM

## 2016-07-16 DIAGNOSIS — F419 Anxiety disorder, unspecified: Secondary | ICD-10-CM

## 2016-07-16 DIAGNOSIS — G933 Postviral fatigue syndrome: Secondary | ICD-10-CM | POA: Diagnosis not present

## 2016-07-16 DIAGNOSIS — R110 Nausea: Secondary | ICD-10-CM | POA: Diagnosis not present

## 2016-07-16 DIAGNOSIS — R5383 Other fatigue: Secondary | ICD-10-CM | POA: Diagnosis not present

## 2016-07-16 DIAGNOSIS — G9331 Postviral fatigue syndrome: Secondary | ICD-10-CM

## 2016-07-16 LAB — CBC WITH DIFFERENTIAL/PLATELET
BASOS ABS: 0 {cells}/uL (ref 0–200)
Basophils Relative: 0 %
EOS ABS: 232 {cells}/uL (ref 15–500)
EOS PCT: 4 %
HCT: 29.8 % — ABNORMAL LOW (ref 35.0–45.0)
Hemoglobin: 8.5 g/dL — ABNORMAL LOW (ref 11.7–15.5)
LYMPHS PCT: 40 %
Lymphs Abs: 2320 cells/uL (ref 850–3900)
MCH: 20 pg — AB (ref 27.0–33.0)
MCHC: 28.5 g/dL — AB (ref 32.0–36.0)
MCV: 70.3 fL — AB (ref 80.0–100.0)
MPV: 8.4 fL (ref 7.5–12.5)
Monocytes Absolute: 638 cells/uL (ref 200–950)
Monocytes Relative: 11 %
NEUTROS PCT: 45 %
Neutro Abs: 2610 cells/uL (ref 1500–7800)
PLATELETS: 147 10*3/uL (ref 140–400)
RBC: 4.24 MIL/uL (ref 3.80–5.10)
RDW: 23.2 % — AB (ref 11.0–15.0)
WBC: 5.8 10*3/uL (ref 3.8–10.8)

## 2016-07-16 LAB — TSH: TSH: 1.25 m[IU]/L

## 2016-07-16 MED ORDER — METOCLOPRAMIDE HCL 10 MG PO TABS: 10.0000 mg | ORAL_TABLET | Freq: Three times a day (TID) | ORAL | 0 refills | Status: DC

## 2016-07-16 NOTE — Patient Instructions (Signed)
virNorovirus Infection A norovirus infection is caused by exposure to a virus in a group of similar viruses (noroviruses). This type of infection causes inflammation in your stomach and intestines (gastroenteritis). Norovirus is the most common cause of gastroenteritis. It also causes food poisoning. Anyone can get a norovirus infection. It spreads very easily (contagious). You can get it from contaminated food, water, surfaces, or other people. Norovirus is found in the stool or vomit of infected people. You can spread the infection as soon as you feel sick until 2 weeks after you recover. Symptoms usually begin within 2 days after you become infected. Most norovirus symptoms affect the digestive system. What are the causes? Norovirus infection is caused by contact with norovirus. You can catch norovirus if you:  Eat or drink something contaminated with norovirus.  Touch surfaces or objects contaminated with norovirus and then put your hand in your mouth.  Have direct contact with an infected person who has symptoms.  Share food, drink, or utensils with someone with who is sick with norovirus.  What are the signs or symptoms? Symptoms of norovirus may include:  Nausea.  Vomiting.  Diarrhea.  Stomach cramps.  Fever.  Chills.  Headache.  Muscle aches.  Tiredness.  How is this diagnosed? Your health care provider may suspect norovirus based on your symptoms and physical exam. Your health care provider may also test a sample of your stool or vomit for the virus. How is this treated? There is no specific treatment for norovirus. Most people get better without treatment in about 2 days. Follow these instructions at home:  Replace lost fluids by drinking plenty of water or rehydration fluids containing important minerals called electrolytes. This prevents dehydration. Drink enough fluid to keep your urine clear or pale yellow.  Do not prepare food for others while you are  infected. Wait at least 3 days after recovering from the illness to do that. How is this prevented?  Wash your hands often, especially after using the toilet or changing a diaper.  Wash fruits and vegetables thoroughly before preparing or serving them.  Throw out any food that a sick person may have touched.  Disinfect contaminated surfaces immediately after someone in the household has been sick. Use a bleach-based household cleaner.  Immediately remove and wash soiled clothes or sheets. Contact a health care provider if:  Your vomiting, diarrhea, and stomach pain is getting worse.  Your symptoms of norovirus do not go away after 2-3 days. Get help right away if: You develop symptoms of dehydration that do not improve with fluid replacement. This may include:  Excessive sleepiness.  Lack of tears.  Dry mouth.  Dizziness when standing.  Weak pulse.  This information is not intended to replace advice given to you by your health care provider. Make sure you discuss any questions you have with your health care provider. Document Released: 03/29/2002 Document Revised: 06/14/2015 Document Reviewed: 06/16/2013 Elsevier Interactive Patient Education  2017 [employer]

## 2016-07-16 NOTE — Progress Notes (Signed)
Presents with several complaints,  nausea without vomiting, GI upset with cramping and diarrhea mostly last week. Continues to have waves of nausea and generally just does not feel well. 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 Sister and 37-year-old nephew live with her full time. Was on vacation last week with long-term partner and reports  extreme fatigue and slept a lot. Takes Loestrin continuously, amenorrheic, has missed several pills in the past month. Denies headache, upper respiratory symptoms, vaginal discharge, urinary symptoms, or fever. History of gastric bypass, weight down greater than 100 pounds. History of elevated hemoglobin A1c.  Exam: Appears like she is not feeling well. Did work today. Temperature 98.  Probable viral syndrome with nausea  Plan: CBC, CMP, hemoglobin A1c, TSH, HCG. Encouraged to increase rest, Reglan 10 mg before meals and at bedtime for the next week. Instructed to call if continued symptoms.

## 2016-07-17 ENCOUNTER — Other Ambulatory Visit: Payer: Self-pay | Admitting: Women's Health

## 2016-07-17 DIAGNOSIS — Z30011 Encounter for initial prescription of contraceptive pills: Secondary | ICD-10-CM

## 2016-07-17 LAB — COMPREHENSIVE METABOLIC PANEL
ALT: 14 U/L (ref 6–29)
AST: 22 U/L (ref 10–30)
Albumin: 3.7 g/dL (ref 3.6–5.1)
Alkaline Phosphatase: 80 U/L (ref 33–115)
BUN: 9 mg/dL (ref 7–25)
CHLORIDE: 108 mmol/L (ref 98–110)
CO2: 20 mmol/L (ref 20–31)
Calcium: 8.3 mg/dL — ABNORMAL LOW (ref 8.6–10.2)
Creat: 0.78 mg/dL (ref 0.50–1.10)
Glucose, Bld: 75 mg/dL (ref 65–99)
POTASSIUM: 3.9 mmol/L (ref 3.5–5.3)
Sodium: 138 mmol/L (ref 135–146)
TOTAL PROTEIN: 7.2 g/dL (ref 6.1–8.1)
Total Bilirubin: 0.1 mg/dL — ABNORMAL LOW (ref 0.2–1.2)

## 2016-07-17 LAB — HEMOGLOBIN A1C
Hgb A1c MFr Bld: 6.1 % — ABNORMAL HIGH (ref <5.7)
Mean Plasma Glucose: 128 mg/dL

## 2016-07-17 LAB — HCG, SERUM, QUALITATIVE: PREG SERUM: NEGATIVE

## 2016-07-18 ENCOUNTER — Other Ambulatory Visit: Payer: Self-pay | Admitting: Women's Health

## 2016-07-18 DIAGNOSIS — D649 Anemia, unspecified: Secondary | ICD-10-CM

## 2016-07-18 DIAGNOSIS — Z30011 Encounter for initial prescription of contraceptive pills: Secondary | ICD-10-CM

## 2016-09-15 ENCOUNTER — Other Ambulatory Visit: Payer: Self-pay | Admitting: Women's Health

## 2016-09-15 DIAGNOSIS — Z30011 Encounter for initial prescription of contraceptive pills: Secondary | ICD-10-CM

## 2016-09-25 ENCOUNTER — Other Ambulatory Visit: Payer: Self-pay | Admitting: Women's Health

## 2016-09-25 DIAGNOSIS — Z30011 Encounter for initial prescription of contraceptive pills: Secondary | ICD-10-CM

## 2016-10-02 ENCOUNTER — Telehealth: Payer: Self-pay | Admitting: *Deleted

## 2016-10-02 DIAGNOSIS — Z30011 Encounter for initial prescription of contraceptive pills: Secondary | ICD-10-CM

## 2016-10-02 MED ORDER — NORETHINDRONE ACET-ETHINYL EST 1-20 MG-MCG PO TABS: 1.0000 | ORAL_TABLET | Freq: Every day | ORAL | 0 refills | Status: DC

## 2016-10-02 NOTE — Telephone Encounter (Signed)
Pt has annual scheduled on 11/12/16, needs refill on birth control pills  Rx sent.

## 2016-11-12 ENCOUNTER — Encounter: Payer: BC Managed Care – PPO | Admitting: Women's Health

## 2016-12-08 ENCOUNTER — Other Ambulatory Visit: Payer: Self-pay | Admitting: Women's Health

## 2016-12-08 DIAGNOSIS — Z30011 Encounter for initial prescription of contraceptive pills: Secondary | ICD-10-CM

## 2016-12-17 ENCOUNTER — Encounter: Payer: BC Managed Care – PPO | Admitting: Women's Health

## 2017-01-08 ENCOUNTER — Encounter: Payer: Self-pay | Admitting: Gastroenterology

## 2017-02-03 ENCOUNTER — Other Ambulatory Visit: Payer: BC Managed Care – PPO

## 2017-02-03 ENCOUNTER — Encounter: Payer: Self-pay | Admitting: Gastroenterology

## 2017-02-03 ENCOUNTER — Other Ambulatory Visit: Payer: Self-pay | Admitting: Women's Health

## 2017-02-03 ENCOUNTER — Ambulatory Visit: Payer: BC Managed Care – PPO | Admitting: Gastroenterology

## 2017-02-03 ENCOUNTER — Other Ambulatory Visit (INDEPENDENT_AMBULATORY_CARE_PROVIDER_SITE_OTHER): Payer: BC Managed Care – PPO

## 2017-02-03 VITALS — BP 118/78 | HR 108 | Ht 69.0 in | Wt 221.0 lb

## 2017-02-03 DIAGNOSIS — Z9884 Bariatric surgery status: Secondary | ICD-10-CM | POA: Diagnosis not present

## 2017-02-03 DIAGNOSIS — R140 Abdominal distension (gaseous): Secondary | ICD-10-CM

## 2017-02-03 DIAGNOSIS — Z30011 Encounter for initial prescription of contraceptive pills: Secondary | ICD-10-CM

## 2017-02-03 DIAGNOSIS — R1030 Lower abdominal pain, unspecified: Secondary | ICD-10-CM | POA: Diagnosis not present

## 2017-02-03 DIAGNOSIS — D509 Iron deficiency anemia, unspecified: Secondary | ICD-10-CM | POA: Diagnosis not present

## 2017-02-03 DIAGNOSIS — K529 Noninfective gastroenteritis and colitis, unspecified: Secondary | ICD-10-CM | POA: Diagnosis not present

## 2017-02-03 DIAGNOSIS — D729 Disorder of white blood cells, unspecified: Secondary | ICD-10-CM

## 2017-02-03 LAB — CBC WITH DIFFERENTIAL/PLATELET
BASOS PCT: 0.4 % (ref 0.0–3.0)
Basophils Absolute: 0 10*3/uL (ref 0.0–0.1)
EOS PCT: 2.8 % (ref 0.0–5.0)
Eosinophils Absolute: 0.2 10*3/uL (ref 0.0–0.7)
HCT: 27.7 % — ABNORMAL LOW (ref 36.0–46.0)
Hemoglobin: 8.1 g/dL — ABNORMAL LOW (ref 12.0–15.0)
LYMPHS ABS: 2 10*3/uL (ref 0.7–4.0)
Lymphocytes Relative: 25.6 % (ref 12.0–46.0)
MCHC: 29.3 g/dL — AB (ref 30.0–36.0)
MONOS PCT: 11.4 % (ref 3.0–12.0)
Monocytes Absolute: 0.9 10*3/uL (ref 0.1–1.0)
NEUTROS ABS: 4.6 10*3/uL (ref 1.4–7.7)
NEUTROS PCT: 59.8 % (ref 43.0–77.0)
Platelets: 283 10*3/uL (ref 150.0–400.0)
RBC: 4.04 Mil/uL (ref 3.87–5.11)
RDW: 29.3 % — AB (ref 11.5–15.5)
WBC: 7.7 10*3/uL (ref 4.0–10.5)

## 2017-02-03 LAB — FERRITIN: FERRITIN: 2.8 ng/mL — AB (ref 10.0–291.0)

## 2017-02-03 LAB — IBC PANEL
IRON: 6 ug/dL — AB (ref 42–145)
Saturation Ratios: 0.9 % — ABNORMAL LOW (ref 20.0–50.0)
Transferrin: 464 mg/dL — ABNORMAL HIGH (ref 212.0–360.0)

## 2017-02-03 LAB — IGA: IGA: 163 mg/dL (ref 68–378)

## 2017-02-03 LAB — TSH: TSH: 1.57 u[IU]/mL (ref 0.35–4.50)

## 2017-02-03 MED ORDER — CEPHALEXIN 250 MG PO CAPS: 250.0000 mg | ORAL_CAPSULE | Freq: Four times a day (QID) | ORAL | 0 refills | Status: AC

## 2017-02-03 MED ORDER — METRONIDAZOLE 500 MG PO TABS: 500.0000 mg | ORAL_TABLET | Freq: Three times a day (TID) | ORAL | 0 refills | Status: AC

## 2017-02-03 NOTE — Progress Notes (Signed)
[facility] Consult Note:  History: [name] [name] 02/03/2017  Referring physician: Knox Royalty, MD  Reason for consult/chief complaint: Diarrhea (? if Gluten intolerance); Gas (and bloating); abdominal burning (feels like stomach is on fire,); and fecal urgency (feeling of having to have a BM but don't need to go)   Subjective  HPI:  This is a 37 year old woman self-referred for several months of chronic diarrhea and abdominal pain.  She reports a bandlike burning lower abdominal pain with some urgency and typically loose nonbloody BMs.  At its worst she will have 6-8 BMs per day, but usually 3 or 4.  She has generalized bloating and gas.  Sometimes she has a visible upper abdominal distention.  She also finds it frustrating that she is unable to lose weight.  She did have a significant weight loss after gastric bypass surgery in 2014.  She remains chronically iron deficient after that and appears to be followed by gynecology for her primary care.  She had microcytic anemia when last checked in June 2018. So far, she has not noticed any clear or consistent triggers for her digestive symptoms.  She wondered if she might be gluten intolerant.  At times she will have an urgent feeling of the need for a BM but nothing will happen.  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   ROS:  Review of Systems  Constitutional: Negative for appetite change and unexpected weight change.  HENT: Negative for mouth sores and voice change.   Eyes: Negative for pain and redness.  Respiratory: Negative for cough and shortness of breath.   Cardiovascular: Negative for chest pain and palpitations.  Genitourinary: Negative for dysuria and hematuria.  Musculoskeletal: Negative for arthralgias and myalgias.  Skin: Negative for pallor and rash.  Neurological: Negative for weakness and headaches.  Hematological: Negative for adenopathy.     Past Medical History: Past  Medical History:  Diagnosis Date  . Anxiety   . Depression   . Diabetes mellitus 05/2008   DR. BALAN  . GERD (gastroesophageal reflux disease)    after taking antibiotics it went away  . Hypertension   . Obesity      Past Surgical History: Past Surgical History:  Procedure Laterality Date  . BREATH TEK H PYLORI N/A 08/20/2012   Procedure: BREATH TEK H PYLORI;  Surgeon: Lodema Pilot, DO;  Location: [facility];  Service: Endoscopy;  Laterality: N/A;  . GASTRIC ROUX-EN-Y N/A 08/24/2012   Procedure: LAPAROSCOPIC ROUX-EN-Y GASTRIC BYPASS WITH UPPER ENDOSCOPY;  Surgeon: Lodema Pilot, DO;  Location: [facility];  Service: General;  Laterality: N/A;     Family History: Family History  Problem Relation Age of Onset  . Diabetes Mother   . Hypertension Mother   . Diabetes Maternal Aunt   . Diabetes Maternal Grandfather   . Dementia Maternal Grandmother     Social History: Social History   Socioeconomic History  . Marital status: Single    Spouse name: None  . Number of children: 0  . Years of education: None  . Highest education level: None  Social Needs  . Financial resource strain: None  . Food insecurity - worry: None  . Food insecurity - inability: None  . Transportation needs - medical: None  . Transportation needs - non-medical: None  Occupational History  . Occupation: Educator  Tobacco Use  . Smoking status: Never Smoker  . Smokeless tobacco: Never Used  Substance and Sexual Activity  . Alcohol use: No  Alcohol/week: 0.0 oz  . Drug use: No  . Sexual activity: Yes    Birth control/protection: Condom    Comment: INTERCOUSRE AGE 9, MORE THAN 5 SEXUAL PARTNERS  Other Topics Concern  . None  Social History Narrative  . None    Allergies: Allergies  Allergen Reactions  . Metformin And Related Itching    Outpatient Meds: Current Outpatient Medications  Medication Sig Dispense Refill  . cetirizine (ZYRTEC) 10 MG tablet Take 10 mg by mouth daily.    Colleen Can. JUNEL  1/20 1-20 MG-MCG tablet TAKE 1 TABLET BY MOUTH DAILY 63 tablet 0  . Multiple Vitamins-Minerals (MULTI-VITAMIN GUMMIES PO) Take 1 tablet by mouth as needed.     . sertraline (ZOLOFT) 50 MG tablet Take 50 mg by mouth daily.    . cephALEXin (KEFLEX) 250 MG capsule Take 1 capsule (250 mg total) by mouth 4 (four) times daily for 10 days. 40 capsule 0  . metroNIDAZOLE (FLAGYL) 500 MG tablet Take 1 tablet (500 mg total) by mouth 3 (three) times daily for 10 days. 30 tablet 0   No current facility-administered medications for this visit.       ___________________________________________________________________ Objective   Exam:  BP 118/78 (BP Location: Left Arm, Patient Position: Sitting, Cuff Size: Normal)   Pulse (!) 108   Ht 5\' 9"  (1.753 m) Comment: height measured without shoes  Wt 221 lb (100.2 kg)   LMP 01/03/2017   BMI 32.64 kg/m    General: this is a(n) Pleasant and well-appearing AA woman  Eyes: sclera anicteric, no redness  ENT: oral mucosa moist without lesions, no cervical or supraclavicular lymphadenopathy, good dentition  CV: RRR without murmur, S1/S2, no JVD, no peripheral edema  Resp: clear to auscultation bilaterally, normal RR and effort noted  GI: soft, no tenderness, with active bowel sounds. No guarding or palpable organomegaly noted.  Skin; warm and dry, no rash or jaundice noted  Neuro: awake, alert and oriented x 3. Normal gross motor function and fluent speech  Labs:  CBC Latest Ref Rng & Units 07/16/2016 03/22/2015 02/15/2014  WBC 3.8 - 10.8 K/uL 5.8 4.7 6.3  Hemoglobin 11.7 - 15.5 g/dL 1.6(X8.5(L) 10.6(L) 11.9(L)  Hematocrit 35.0 - 45.0 % 29.8(L) 35.4(L) 36.6  Platelets 140 - 400 K/uL 147 366 289   CMP Latest Ref Rng & Units 07/16/2016 12/07/2012 08/25/2012  Glucose 65 - 99 mg/dL 75 096(E129(H) 454(U234(H)  BUN 7 - 25 mg/dL 9 7 6   Creatinine 0.50 - 1.10 mg/dL 9.810.78 1.910.64 4.780.84  Sodium 135 - 146 mmol/L 138 140 137  Potassium 3.5 - 5.3 mmol/L 3.9 3.4(L) 4.0  Chloride 98  - 110 mmol/L 108 105 102  CO2 20 - 31 mmol/L 20 25 27   Calcium 8.6 - 10.2 mg/dL 8.3(L) 8.7 8.8  Total Protein 6.1 - 8.1 g/dL 7.2 - 7.4  Total Bilirubin 0.2 - 1.2 mg/dL 2.9(F0.1(L) - 0.3  Alkaline Phos 33 - 115 U/L 80 - 85  AST 10 - 30 U/L 22 - 73(H)  ALT 6 - 29 U/L 14 - 64(H)    TSH nml 6/18  Radiologic Studies:  KUB 02/28/16 during ED visit - moderate stool  Assessment: Encounter Diagnoses  Name Primary?  . Chronic diarrhea Yes  . Abdominal bloating   . Lower abdominal pain   . Iron deficiency anemia, unspecified iron deficiency anemia type   . S/P gastric bypass     Possible IBS, bacterial overgrowth, less likely IBD or celiac sprue.  No risk factors  for pancreatic insufficiency, and no unanticipated weight loss. The iron deficiency anemia is from poor iron absorption after gastric bypass.  She does not take oral supplements regularly, but I suspect they probably would not work well for her anyway.  Plan:  CBC, iron studies, TSH, TTG antibody Empiric treatment for possible bacterial overgrowth with 10 days of cephalexin and metronidazole. I have asked her to call us in a few weeks with an update before she plans a trip to [place] in early February.  She is not much improved, probable trial of antispasmodic agent and consideration of colonoscopy.  Thank you for the courtesy of this consult.  Please call me with any questions or concerns.  Charlie Pitter III  CC: Knox Royalty, MD

## 2017-02-03 NOTE — Patient Instructions (Signed)
If you are age 38 or older, your body mass index should be between 23-30. Your Body mass index is 32.64 kg/m. If this is out of the aforementioned range listed, please consider follow up with your Primary Care Provider.  If you are age 38 or younger, your body mass index should be between 19-25. Your Body mass index is 32.64 kg/m. If this is out of the aformentioned range listed, please consider follow up with your Primary Care Provider.   We have sent the following medications to your pharmacy for you to pick up at your convenience:Keflex, Cipro  Your physician has requested that you go to the basement for lab work before leaving today.  Thank you for choosing  GI  Dr Amada JupiterHenry Danis III

## 2017-02-04 LAB — TISSUE TRANSGLUTAMINASE, IGA: (tTG) Ab, IgA: 1 U/mL

## 2017-02-04 LAB — PATHOLOGIST SMEAR REVIEW

## 2017-02-04 NOTE — Telephone Encounter (Signed)
Okay for refill?  

## 2017-02-04 NOTE — Telephone Encounter (Signed)
Annual exam on 02/09/17, last seen for annual was march 2017

## 2017-02-05 ENCOUNTER — Other Ambulatory Visit: Payer: Self-pay

## 2017-02-05 DIAGNOSIS — D508 Other iron deficiency anemias: Secondary | ICD-10-CM

## 2017-02-09 ENCOUNTER — Encounter: Payer: Self-pay | Admitting: Women's Health

## 2017-02-09 ENCOUNTER — Ambulatory Visit: Payer: BC Managed Care – PPO | Admitting: Women's Health

## 2017-02-09 VITALS — BP 122/80 | Ht 69.0 in | Wt 219.0 lb

## 2017-02-09 DIAGNOSIS — Z30011 Encounter for initial prescription of contraceptive pills: Secondary | ICD-10-CM | POA: Diagnosis not present

## 2017-02-09 DIAGNOSIS — Z01419 Encounter for gynecological examination (general) (routine) without abnormal findings: Secondary | ICD-10-CM | POA: Diagnosis not present

## 2017-02-09 DIAGNOSIS — Z833 Family history of diabetes mellitus: Secondary | ICD-10-CM

## 2017-02-09 MED ORDER — NORETHINDRONE ACET-ETHINYL EST 1-20 MG-MCG PO TABS: 1.0000 | ORAL_TABLET | Freq: Every day | ORAL | 4 refills | Status: DC

## 2017-02-09 NOTE — Progress Notes (Signed)
[name] [name] May 18, 1980 [id number]    History:    Presents for annual exam. Monthly cycle on Loestrin without complaint. Normal Pap history. Negative STD screen with current partner engaged marriage planned in February. Gastric bypass had lost 125 pounds but is now up 20. Anemia primary care managing.  Past medical history, past surgical history, family history and social history were all reviewed and documented in the EPIC chart. 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 Mother diabetes, father healthy.  ROS:  A ROS was performed and pertinent positives and negatives are included.  Exam:  Vitals:   02/09/17 1045  BP: 122/80  Weight: 219 lb (99.3 kg)  Height: 5\' 9"  (1.753 m)   Body mass index is 32.34 kg/m.   General appearance:  Normal Thyroid:  Symmetrical, normal in size, without palpable masses or nodularity. Respiratory  Auscultation:  Clear without wheezing or rhonchi Cardiovascular  Auscultation:  Regular rate, without rubs, murmurs or gallops  Edema/varicosities:  Not grossly evident Abdominal  Soft,nontender, without masses, guarding or rebound.  Liver/spleen:  No organomegaly noted  Hernia:  None appreciated  Skin  Inspection:  Grossly normal   Breasts: Examined lying and sitting.     Right: Without masses, retractions, discharge or axillary adenopathy.     Left: Without masses, retractions, discharge or axillary adenopathy. Gentitourinary   Inguinal/mons:  Normal without inguinal adenopathy  External genitalia:  Normal  BUS/Urethra/Skene's glands:  Normal  Vagina:  Normal  Cervix:  Normal  Uterus:  normal in size, shape and contour.  Midline and mobile  Adnexa/parametria:     Rt: Without masses or tenderness.   Lt: Without masses or tenderness.  Anus and perineum: Normal  Digital rectal exam: Normal sphincter tone without palpated masses or tenderness  Assessment/Plan:  37 y.o. engaged BF G0 for annual exam with no  complaints.  Monthly cycle on Loestrin Anemia, anxiety and depression- primary care managing labs and meds  Plan: Loestrin 1/20 prescription, proper use, slight risk for blood clots and strokes reviewed. History of anemia due for iron infusion primary care. Reviewed importance of increasing regular exercise, decreasing calories/carbs, calcium rich foods, MVI daily encouraged. Requested hemoglobin A1c, rubella,  Pap with HR HPV typing, new screening guidelines reviewed.  Harrington Challengerancy J Katha Kuehne [facility]NP, 11:53 AM 02/09/2017

## 2017-02-09 NOTE — Progress Notes (Signed)
lab

## 2017-02-09 NOTE — Patient Instructions (Signed)

## 2017-02-10 LAB — HEMOGLOBIN A1C
HEMOGLOBIN A1C: 6.5 %{Hb} — AB (ref <5.7)
MEAN PLASMA GLUCOSE: 140 (calc)
eAG (mmol/L): 7.7 (calc)

## 2017-02-10 LAB — RUBELLA SCREEN: RUBELLA: 2.55 {index}

## 2017-02-11 ENCOUNTER — Encounter: Payer: Self-pay | Admitting: Women's Health

## 2017-02-11 ENCOUNTER — Ambulatory Visit (HOSPITAL_COMMUNITY)
Admission: RE | Admit: 2017-02-11 | Discharge: 2017-02-11 | Disposition: A | Discharge status: Home / Self Care | Payer: BC Managed Care – PPO | Source: Ambulatory Visit | Attending: Gastroenterology | Admitting: Gastroenterology

## 2017-02-11 DIAGNOSIS — D508 Other iron deficiency anemias: Secondary | ICD-10-CM | POA: Insufficient documentation

## 2017-02-11 LAB — PAP, TP IMAGING W/ HPV RNA, RFLX HPV TYPE 16,18/45: HPV DNA High Risk: NOT DETECTED

## 2017-02-11 MED ORDER — SODIUM CHLORIDE 0.9 % IV SOLN
510.0000 mg | INTRAVENOUS | Status: DC
  Administered 2017-02-11: 510 mg via INTRAVENOUS
  Filled 2017-02-11: qty 17

## 2017-02-17 ENCOUNTER — Other Ambulatory Visit (HOSPITAL_COMMUNITY): Payer: Self-pay | Admitting: *Deleted

## 2017-02-18 ENCOUNTER — Encounter: Payer: Self-pay | Admitting: Gastroenterology

## 2017-02-18 ENCOUNTER — Ambulatory Visit (HOSPITAL_COMMUNITY)
Admission: RE | Admit: 2017-02-18 | Discharge: 2017-02-18 | Disposition: A | Discharge status: Home / Self Care | Payer: BC Managed Care – PPO | Source: Ambulatory Visit | Attending: Gastroenterology | Admitting: Gastroenterology

## 2017-02-18 ENCOUNTER — Other Ambulatory Visit: Payer: Self-pay

## 2017-02-18 DIAGNOSIS — D508 Other iron deficiency anemias: Secondary | ICD-10-CM | POA: Insufficient documentation

## 2017-02-18 DIAGNOSIS — D509 Iron deficiency anemia, unspecified: Secondary | ICD-10-CM

## 2017-02-18 MED ORDER — SODIUM CHLORIDE 0.9 % IV SOLN
510.0000 mg | INTRAVENOUS | Status: AC
  Administered 2017-02-18: 510 mg via INTRAVENOUS
  Filled 2017-02-18: qty 17

## 2017-02-18 NOTE — Telephone Encounter (Signed)
She is doing better (sent me portal message).  Please arrange check of hemoglobin and ferritin in 4-5 weeks and an office visit within 2 weeks after the blood draw.  Thanks.  - HD

## 2017-03-27 ENCOUNTER — Other Ambulatory Visit (INDEPENDENT_AMBULATORY_CARE_PROVIDER_SITE_OTHER): Payer: BC Managed Care – PPO

## 2017-03-27 DIAGNOSIS — D509 Iron deficiency anemia, unspecified: Secondary | ICD-10-CM

## 2017-03-27 LAB — IBC PANEL
Iron: 62 ug/dL (ref 42–145)
SATURATION RATIOS: 13.1 % — AB (ref 20.0–50.0)
TRANSFERRIN: 339 mg/dL (ref 212.0–360.0)

## 2017-03-27 LAB — CBC WITH DIFFERENTIAL/PLATELET
Basophils Absolute: 0 10*3/uL (ref 0.0–0.1)
Basophils Relative: 0.5 % (ref 0.0–3.0)
EOS PCT: 3.2 % (ref 0.0–5.0)
Eosinophils Absolute: 0.2 10*3/uL (ref 0.0–0.7)
HCT: 39.1 % (ref 36.0–46.0)
Hemoglobin: 12.3 g/dL (ref 12.0–15.0)
Lymphocytes Relative: 40.5 % (ref 12.0–46.0)
Lymphs Abs: 2.3 10*3/uL (ref 0.7–4.0)
MCHC: 31.4 g/dL (ref 30.0–36.0)
MCV: 81.1 fl (ref 78.0–100.0)
MONO ABS: 0.7 10*3/uL (ref 0.1–1.0)
MONOS PCT: 12 % (ref 3.0–12.0)
NEUTROS ABS: 2.4 10*3/uL (ref 1.4–7.7)
NEUTROS PCT: 43.8 % (ref 43.0–77.0)
Platelets: 245 10*3/uL (ref 150.0–400.0)
RBC: 4.82 Mil/uL (ref 3.87–5.11)
RDW: 30.1 % — ABNORMAL HIGH (ref 11.5–15.5)
WBC: 5.6 10*3/uL (ref 4.0–10.5)

## 2017-03-30 ENCOUNTER — Ambulatory Visit: Payer: BC Managed Care – PPO | Admitting: Gastroenterology

## 2017-03-30 ENCOUNTER — Encounter: Payer: Self-pay | Admitting: Gastroenterology

## 2017-03-30 VITALS — BP 104/78 | HR 82 | Ht 69.0 in | Wt 219.0 lb

## 2017-03-30 DIAGNOSIS — K529 Noninfective gastroenteritis and colitis, unspecified: Secondary | ICD-10-CM

## 2017-03-30 DIAGNOSIS — D509 Iron deficiency anemia, unspecified: Secondary | ICD-10-CM

## 2017-03-30 DIAGNOSIS — Z9884 Bariatric surgery status: Secondary | ICD-10-CM | POA: Diagnosis not present

## 2017-03-30 DIAGNOSIS — R140 Abdominal distension (gaseous): Secondary | ICD-10-CM | POA: Diagnosis not present

## 2017-03-30 NOTE — Patient Instructions (Signed)
If you are age 38 or older, your body mass index should be between 23-30. Your Body mass index is 32.34 kg/m. If this is out of the aforementioned range listed, please consider follow up with your Primary Care Provider.  If you are age 38 or younger, your body mass index should be between 19-25. Your Body mass index is 32.34 kg/m. If this is out of the aformentioned range listed, please consider follow up with your Primary Care Provider.   Please have repeat labs in 3 months.  Thank you for choosing [facility]  Dr Amada JupiterHenry Danis III

## 2017-03-30 NOTE — Addendum Note (Signed)
Addended by: Alexis FrockDD, Joyleen Haselton V on: 03/30/2017 09:32 AM   Modules accepted: Orders

## 2017-03-30 NOTE — Progress Notes (Signed)
     [facility] Progress Note  Chief Complaint: Abdominal pain  Subjective  History:  [name] [name] follows up for lower abdominal pain with bloating gas and diarrhea as well as iron deficiency anemia.  She was last seen about 2 months ago.  Iron deficiency anemia as result of gastric bypass was discovered, and treated with 2 doses of Feraheme. I also gave her cefalexin and metronidazole for empiric therapy of SIBO.  I am glad to say she is feeling much better.  She has noticed many foods that do not agree with her, she completed the antibiotic therapy and took the iron treatments.  She has occasional bloating and gas with certain offending foods, but otherwise her symptoms have largely resolved.  She has considerably improved energy, and was very glad to felt better for her recent wedding day on honeymoon.  ROS: Cardiovascular:  no chest pain Respiratory: no dyspnea  The patient's Past Medical, Family and Social History were reviewed and are on file in the EMR.  Objective:  Med list reviewed  Current Outpatient Medications:  .  cetirizine (ZYRTEC) 10 MG tablet, Take 10 mg by mouth daily., Disp: , Rfl:  .  Multiple Vitamins-Minerals (MULTI-VITAMIN GUMMIES PO), Take 1 tablet by mouth as needed. , Disp: , Rfl:  .  norethindrone-ethinyl estradiol (JUNEL 1/20) 1-20 MG-MCG tablet, Take 1 tablet by mouth daily., Disp: 63 tablet, Rfl: 4 .  sertraline (ZOLOFT) 50 MG tablet, Take 50 mg by mouth daily., Disp: , Rfl:    Vital signs in last 24 hrs: Vitals:   03/30/17 0847  BP: 104/78  Pulse: 82    Physical Exam  Well-appearing  HEENT: sclera anicteric, oral mucosa moist without lesions  Neck: supple, no thyromegaly, JVD or lymphadenopathy  Cardiac: RRR without murmurs, S1S2 heard, no peripheral edema  Pulm: clear to auscultation bilaterally, normal RR and effort noted  Abdomen: soft, no tenderness, with active bowel sounds. No guarding or palpable hepatosplenomegaly.  Skin; warm  and dry, no jaundice or rash  Recent Labs:  CBC Latest Ref Rng & Units 03/27/2017 02/03/2017 07/16/2016  WBC 4.0 - 10.5 K/uL 5.6 7.7 5.8  Hemoglobin 12.0 - 15.0 g/dL 91.412.3 8.1 Repeated and verified X2.(L) 8.5(L)  Hematocrit 36.0 - 46.0 % 39.1 27.7(L) 29.8(L)  Platelets 150.0 - 400.0 K/uL 245.0 283.0 147   Iron/TIBC/Ferritin/ %Sat    Component Value Date/Time   IRON 62 03/27/2017 1203   FERRITIN 2.8 (L) 02/03/2017 1539   IRONPCTSAT 13.1 (L) 03/27/2017 1203   Normal TSH, TTG and IgA Radiologic studies:    @ASSESSMENTPLANBEGIN @ Assessment: Encounter Diagnoses  Name Primary?  . Chronic diarrhea Yes  . Abdominal bloating   . Iron deficiency anemia, unspecified iron deficiency anemia type   . S/P gastric bypass     She may have had bacterial overgrowth, and I think there was an element of dietary intolerance from gastric bypass anatomy.  Iron deficiency is now resolved after 2 treatments of IV iron.  Plan:  Continue current management. Recheck CBC and iron levels in 3 months and give IV iron as needed.  She was encouraged to establish with primary care. Otherwise see me as needed.  Total time 15 minutes, over half spent in counseling and coordination of care.   Charlie PitterHenry L Danis III

## 2017-04-02 ENCOUNTER — Encounter (HOSPITAL_COMMUNITY): Payer: Self-pay

## 2017-04-07 ENCOUNTER — Encounter: Payer: Self-pay | Admitting: Women's Health

## 2017-04-14 ENCOUNTER — Other Ambulatory Visit: Payer: Self-pay | Admitting: Women's Health

## 2017-04-14 DIAGNOSIS — Z30011 Encounter for initial prescription of contraceptive pills: Secondary | ICD-10-CM

## 2017-04-22 ENCOUNTER — Encounter: Payer: Self-pay | Admitting: Gastroenterology

## 2017-04-23 ENCOUNTER — Telehealth: Payer: Self-pay | Admitting: Gastroenterology

## 2017-04-23 ENCOUNTER — Other Ambulatory Visit: Payer: Self-pay

## 2017-04-23 DIAGNOSIS — D509 Iron deficiency anemia, unspecified: Secondary | ICD-10-CM

## 2017-04-23 NOTE — Telephone Encounter (Signed)
Patient sent me a message that she is feeling fatigued again and would like to check iron levels.  Please arrange iron, TIBC, ferritin and hemoglobin/hematocrit and let her know.

## 2017-04-23 NOTE — Telephone Encounter (Signed)
Lab orders placed, TIBC, Ferritin, H&H.  I left a message for the patient with detailed information.

## 2017-05-12 ENCOUNTER — Telehealth: Payer: Self-pay | Admitting: *Deleted

## 2017-05-12 NOTE — Telephone Encounter (Signed)
Pt called recently stopped birth control pills trying to conceive, states she stopped pills about 4 weeks ago and no cycle yet. I explained to patient to confirm with UPT  And if positive schedule visit with Harriett SineNancy. Pt verbalized she understood.

## 2017-06-02 ENCOUNTER — Other Ambulatory Visit (INDEPENDENT_AMBULATORY_CARE_PROVIDER_SITE_OTHER): Payer: BC Managed Care – PPO

## 2017-06-02 DIAGNOSIS — D509 Iron deficiency anemia, unspecified: Secondary | ICD-10-CM

## 2017-06-02 DIAGNOSIS — Z9884 Bariatric surgery status: Secondary | ICD-10-CM

## 2017-06-02 DIAGNOSIS — R140 Abdominal distension (gaseous): Secondary | ICD-10-CM | POA: Diagnosis not present

## 2017-06-02 DIAGNOSIS — K529 Noninfective gastroenteritis and colitis, unspecified: Secondary | ICD-10-CM | POA: Diagnosis not present

## 2017-06-02 LAB — IBC PANEL
Iron: 122 ug/dL (ref 42–145)
SATURATION RATIOS: 27.1 % (ref 20.0–50.0)
TRANSFERRIN: 321 mg/dL (ref 212.0–360.0)

## 2017-06-02 LAB — CBC WITH DIFFERENTIAL/PLATELET
BASOS ABS: 0 10*3/uL (ref 0.0–0.1)
Basophils Relative: 0.4 % (ref 0.0–3.0)
EOS PCT: 3.7 % (ref 0.0–5.0)
Eosinophils Absolute: 0.2 10*3/uL (ref 0.0–0.7)
HCT: 37.8 % (ref 36.0–46.0)
HEMOGLOBIN: 12.6 g/dL (ref 12.0–15.0)
LYMPHS ABS: 1.9 10*3/uL (ref 0.7–4.0)
Lymphocytes Relative: 35.4 % (ref 12.0–46.0)
MCHC: 33.2 g/dL (ref 30.0–36.0)
MCV: 84 fl (ref 78.0–100.0)
MONO ABS: 0.6 10*3/uL (ref 0.1–1.0)
MONOS PCT: 11 % (ref 3.0–12.0)
NEUTROS PCT: 49.5 % (ref 43.0–77.0)
Neutro Abs: 2.7 10*3/uL (ref 1.4–7.7)
Platelets: 287 10*3/uL (ref 150.0–400.0)
RBC: 4.51 Mil/uL (ref 3.87–5.11)
RDW: 13.6 % (ref 11.5–15.5)
WBC: 5.5 10*3/uL (ref 4.0–10.5)

## 2017-06-02 LAB — FERRITIN: Ferritin: 7.5 ng/mL — ABNORMAL LOW (ref 10.0–291.0)

## 2017-06-02 NOTE — Addendum Note (Signed)
Addended by: Trellis Paganini D on: 06/02/2017 09:43 AM   Modules accepted: Orders

## 2017-06-03 ENCOUNTER — Other Ambulatory Visit: Payer: Self-pay

## 2017-06-03 ENCOUNTER — Encounter: Payer: Self-pay | Admitting: Hematology

## 2017-06-03 ENCOUNTER — Telehealth: Payer: Self-pay | Admitting: Hematology

## 2017-06-03 DIAGNOSIS — D509 Iron deficiency anemia, unspecified: Secondary | ICD-10-CM

## 2017-06-03 LAB — IRON AND TIBC
IRON: 109 ug/dL (ref 27–159)
Iron Saturation: 28 % (ref 15–55)
TIBC: 394 ug/dL (ref 250–450)
UIBC: 285 ug/dL (ref 131–425)

## 2017-06-03 NOTE — Telephone Encounter (Signed)
Referral from Dr. Myrtie Neither from [facility] for a dx: IDA. Tc has been mad for the pt to see Dr. Candise Che on 6/10 at 1pm. Pt aware to arrive 30 minutes early. Letter mailed.

## 2017-06-11 ENCOUNTER — Other Ambulatory Visit (HOSPITAL_COMMUNITY): Payer: Self-pay | Admitting: *Deleted

## 2017-06-12 ENCOUNTER — Ambulatory Visit (HOSPITAL_COMMUNITY)
Admission: RE | Admit: 2017-06-12 | Discharge: 2017-06-12 | Disposition: A | Discharge status: Home / Self Care | Payer: BC Managed Care – PPO | Source: Ambulatory Visit | Attending: Gastroenterology | Admitting: Gastroenterology

## 2017-06-12 DIAGNOSIS — D509 Iron deficiency anemia, unspecified: Secondary | ICD-10-CM | POA: Insufficient documentation

## 2017-06-12 MED ORDER — SODIUM CHLORIDE 0.9 % IV SOLN
510.0000 mg | Freq: Once | INTRAVENOUS | Status: AC
  Administered 2017-06-12: 09:00:00 510 mg via INTRAVENOUS
  Filled 2017-06-12: qty 17

## 2017-06-26 NOTE — Progress Notes (Signed)
HEMATOLOGY/ONCOLOGY CONSULTATION NOTE  Date of Service: 06/29/2017  Patient Care Team: Knox RoyaltyJones, Enrico, MD as PCP - General (Family Medicine) Himmelrich, Loree FeeSara S, RD (Inactive) as Dietitian [facility](Bariatrics)  CHIEF COMPLAINTS/PURPOSE OF CONSULTATION:  Iron Deficiency Anemia  HISTORY OF PRESENTING ILLNESS:   [name] [name] is a wonderful 37 y.o. female who has been referred to us by Dr Knox RoyaltyEnrico Jones for evaluation and management of Iron Deficiency Anemia. The pt reports that she is doing well overall.   The pt reports being told that she had low iron last year from her OBGYN and had not been having periods at the time due to taking oral contraceptives. She was placed on PO iron and notes that she tolerated this well but was not diligent about taking it regularly. She notes that she stopped her birth control and is trying to become pregnant which will also increase her iron needs.   She had her gastric bypass in 2014 and did not have iron deficiency. She denies taking vitamins in the aftermath of her surgery. She has followed back up with Dr Myrtie Neitheranis in GI recently for her iron deficiency who was concerned for absorption issues. She denies taking Vitamin B12 regularly. She had IV Iron within the last few weeks but her last ferritin labs on 06/02/17 showed she is still iron deficient. She notes that she tolerated IV Feraheme well and had no concerns for an allergic reaction.   She denies having Vitamin B12 or Vitamin D deficiencies.   Most recent lab results (06/02/17) of CBC  is as follows: all values are WNL. Ferritin 06/02/17 is low at 7.5  On review of systems, pt reports fatigue, regular bowel movements, and denies blood in the stools, black stools, unexpected weight loss, vision problems, abdominal pains, leg swelling, and any other symptoms.   On PMHx the pt reports gastric bypass in 2014.   MEDICAL HISTORY:  Past Medical History:  Diagnosis Date  . Anxiety   . Depression   . Diabetes  mellitus 05/2008   DR. BALAN  . GERD (gastroesophageal reflux disease)    after taking antibiotics it went away  . Hypertension   . Obesity     SURGICAL HISTORY: Past Surgical History:  Procedure Laterality Date  . BREATH TEK H PYLORI N/A 08/20/2012   Procedure: BREATH TEK H PYLORI;  Surgeon: Lodema PilotBrian Layton, DO;  Location: [facility];  Service: Endoscopy;  Laterality: N/A;  . GASTRIC ROUX-EN-Y N/A 08/24/2012   Procedure: LAPAROSCOPIC ROUX-EN-Y GASTRIC BYPASS WITH UPPER ENDOSCOPY;  Surgeon: Lodema PilotBrian Layton, DO;  Location: [facility] ORS;  Service: General;  Laterality: N/A;    SOCIAL HISTORY: Social History   Socioeconomic History  . Marital status: Single    Spouse name: Not on file  . Number of children: 0  . Years of education: Not on file  . Highest education level: Not on file  Occupational History  . Occupation: Programmer, systemsducator  Social Needs  . Financial resource strain: Not on file  . Food insecurity:    Worry: Not on file    Inability: Not on file  . Transportation needs:    Medical: Not on file    Non-medical: Not on file  Tobacco Use  . Smoking status: Never Smoker  . Smokeless tobacco: Never Used  Substance and Sexual Activity  . Alcohol use: No    Alcohol/week: 0.0 oz  . Drug use: No  . Sexual activity: Yes    Birth control/protection: Condom    Comment: INTERCOUSRE  AGE 54, MORE THAN 5 SEXUAL PARTNERS  Lifestyle  . Physical activity:    Days per week: Not on file    Minutes per session: Not on file  . Stress: Not on file  Relationships  . Social connections:    Talks on phone: Not on file    Gets together: Not on file    Attends religious service: Not on file    Active member of club or organization: Not on file    Attends meetings of clubs or organizations: Not on file    Relationship status: Not on file  . Intimate partner violence:    Fear of current or ex partner: Not on file    Emotionally abused: Not on file    Physically abused: Not on file    Forced sexual  activity: Not on file  Other Topics Concern  . Not on file  Social History Narrative  . Not on file    FAMILY HISTORY: Family History  Problem Relation Age of Onset  . Diabetes Mother   . Hypertension Mother   . Diabetes Maternal Aunt   . Diabetes Maternal Grandfather   . Dementia Maternal Grandmother     ALLERGIES:  is allergic to metformin and related.  MEDICATIONS:  Current Outpatient Medications  Medication Sig Dispense Refill  . cetirizine (ZYRTEC) 10 MG tablet Take 10 mg by mouth daily.    . iron polysaccharides (NIFEREX) 150 MG capsule Take 1 capsule (150 mg total) by mouth daily. 30 capsule 4  . JUNEL 1/20 1-20 MG-MCG tablet TAKE 1 TABLET BY MOUTH EVERY DAY 63 tablet 2  . Multiple Vitamins-Minerals (MULTI-VITAMIN GUMMIES PO) Take 1 tablet by mouth as needed.     . sertraline (ZOLOFT) 50 MG tablet Take 50 mg by mouth daily.     No current facility-administered medications for this visit.     REVIEW OF SYSTEMS:    10 Point review of Systems was done is negative except as noted above.  PHYSICAL EXAMINATION:  . Vitals:   06/29/17 1250  BP: 103/84  Pulse: 97  Resp: 18  Temp: 98.6 F (37 C)  SpO2: 100%   Filed Weights   06/29/17 1250  Weight: 224 lb 9.6 oz (101.9 kg)   .Body mass index is 33.17 kg/m.  GENERAL:alert, in no acute distress and comfortable SKIN: no acute rashes, no significant lesions EYES: conjunctiva are pink and non-injected, sclera anicteric OROPHARYNX: MMM, no exudates, no oropharyngeal erythema or ulceration NECK: supple, no JVD LYMPH:  no palpable lymphadenopathy in the cervical, axillary or inguinal regions LUNGS: clear to auscultation b/l with normal respiratory effort HEART: regular rate & rhythm ABDOMEN:  normoactive bowel sounds , non tender, not distended. Extremity: no pedal edema PSYCH: alert & oriented x 3 with fluent speech NEURO: no focal motor/sensory deficits  LABORATORY DATA:  I have reviewed the data as  listed  . CBC Latest Ref Rng & Units 06/02/2017 03/27/2017 02/03/2017  WBC 4.0 - 10.5 K/uL 5.5 5.6 7.7  Hemoglobin 12.0 - 15.0 g/dL 40.9 81.1 8.1 Repeated and verified X2.(L)  Hematocrit 36.0 - 46.0 % 37.8 39.1 27.7(L)  Platelets 150.0 - 400.0 K/uL 287.0 245.0 283.0    . CMP Latest Ref Rng & Units 07/16/2016 12/07/2012 08/25/2012  Glucose 65 - 99 mg/dL 75 914(N) 829(F)  BUN 7 - 25 mg/dL 9 7 6   Creatinine 0.50 - 1.10 mg/dL 6.21 3.08 6.57  Sodium 135 - 146 mmol/L 138 140 137  Potassium 3.5 - 5.3  mmol/L 3.9 3.4(L) 4.0  Chloride 98 - 110 mmol/L 108 105 102  CO2 20 - 31 mmol/L 20 25 27   Calcium 8.6 - 10.2 mg/dL 8.3(L) 8.7 8.8  Total Protein 6.1 - 8.1 g/dL 7.2 - 7.4  Total Bilirubin 0.2 - 1.2 mg/dL 9.6(E) - 0.3  Alkaline Phos 33 - 115 U/L 80 - 85  AST 10 - 30 U/L 22 - 73(H)  ALT 6 - 29 U/L 14 - 64(H)   . Lab Results  Component Value Date   IRON 37 (L) 06/29/2017   TIBC 369 06/29/2017   IRONPCTSAT 10 (L) 06/29/2017   (Iron and TIBC)  Lab Results  Component Value Date   FERRITIN 122 06/29/2017      RADIOGRAPHIC STUDIES: I have personally reviewed the radiological images as listed and agreed with the findings in the report. No results found.  ASSESSMENT & PLAN:   37 y.o. female with   1. Iron Deficiency Unclear etiology though likely related to iron absorptions issues related to gastric bypass surgery. No dietary restrictions No overt gi bleeding.  2. Anemia due to Iron deficiency -- resolved with IV iron replacement recently  PLAN  -Discussed patient's most recent labs from 06/02/17, Ferritin low at 7.5 -Discussed the recommendation to replace Iron with IV Feraheme and maintaining adequate iron levels with PO Iron Polysaccharide 150mg  po daily for maintenance -Will collect labs today and again in 4 months -Will check for other vitamin deficiencies given gastric bypass in the past -noted to have significant B12 and Vit D deficiency -- consistent with malabsorption issues  related to gastric bypass surgery. -IV Feraheme x 1 doses to target ferritin >100 and iron saturation >20% - WILL defer to GI and PCP regarding need for additional GI workup.  3. Newly noted B12 deficiency B12 level 155 Plan -patient to start taking B12 SL OTC daily -recheck on followup  4. Severe Vit D deficiency -- 25)H vit levels < 4 Plan -Ergocalciferol 50k units twice weekly   Labs today IV feraheme x 1 dose ASAP RTC with Dr Candise Che in 4 months with labs   All of the patients questions were answered with apparent satisfaction. The patient knows to call the clinic with any problems, questions or concerns.  The toal time spent in the appt was 60 minutes and more than 50% was on counseling and direct patient cares.    Wyvonnia Lora MD MS AAHIVMS [facility] Hematology/Oncology Physician [facility]  (Office):       [phone] (Work cell):  [phone] (Fax):           [phone]  06/29/2017 1:42 PM  I, Marcelline Mates, am acting as a Neurosurgeon for Dr Candise Che.   .I have reviewed the above documentation for accuracy and completeness, and I agree with the above. Johney Maine MD

## 2017-06-29 ENCOUNTER — Inpatient Hospital Stay: Payer: BC Managed Care – PPO

## 2017-06-29 ENCOUNTER — Inpatient Hospital Stay: Payer: BC Managed Care – PPO | Attending: Hematology | Admitting: Hematology

## 2017-06-29 ENCOUNTER — Telehealth: Payer: Self-pay | Admitting: Hematology

## 2017-06-29 VITALS — BP 103/84 | HR 97 | Temp 98.6°F | Resp 18 | Ht 69.0 in | Wt 224.6 lb

## 2017-06-29 DIAGNOSIS — D509 Iron deficiency anemia, unspecified: Secondary | ICD-10-CM

## 2017-06-29 DIAGNOSIS — E559 Vitamin D deficiency, unspecified: Secondary | ICD-10-CM | POA: Diagnosis not present

## 2017-06-29 DIAGNOSIS — Z9884 Bariatric surgery status: Secondary | ICD-10-CM | POA: Diagnosis not present

## 2017-06-29 DIAGNOSIS — E538 Deficiency of other specified B group vitamins: Secondary | ICD-10-CM | POA: Insufficient documentation

## 2017-06-29 LAB — CBC WITH DIFFERENTIAL/PLATELET
BASOS ABS: 0 10*3/uL (ref 0.0–0.1)
Basophils Relative: 0 %
EOS PCT: 2 %
Eosinophils Absolute: 0.2 10*3/uL (ref 0.0–0.5)
HEMATOCRIT: 41.8 % (ref 34.8–46.6)
Hemoglobin: 13.8 g/dL (ref 11.6–15.9)
LYMPHS ABS: 0.9 10*3/uL (ref 0.9–3.3)
LYMPHS PCT: 10 %
MCH: 28.7 pg (ref 25.1–34.0)
MCHC: 33 g/dL (ref 31.5–36.0)
MCV: 87.1 fL (ref 79.5–101.0)
Monocytes Absolute: 0.7 10*3/uL (ref 0.1–0.9)
Monocytes Relative: 7 %
NEUTROS ABS: 7.7 10*3/uL — AB (ref 1.5–6.5)
Neutrophils Relative %: 81 %
PLATELETS: 264 10*3/uL (ref 145–400)
RBC: 4.8 MIL/uL (ref 3.70–5.45)
RDW: 16.2 % — AB (ref 11.2–14.5)
WBC: 9.4 10*3/uL (ref 3.9–10.3)

## 2017-06-29 LAB — CMP (CANCER CENTER ONLY)
ALBUMIN: 3.9 g/dL (ref 3.5–5.0)
ALK PHOS: 172 U/L — AB (ref 40–150)
ALT: 42 U/L (ref 0–55)
ANION GAP: 7 (ref 3–11)
AST: 30 U/L (ref 5–34)
BILIRUBIN TOTAL: 0.6 mg/dL (ref 0.2–1.2)
BUN: 8 mg/dL (ref 7–26)
CALCIUM: 9 mg/dL (ref 8.4–10.4)
CO2: 24 mmol/L (ref 22–29)
Chloride: 105 mmol/L (ref 98–109)
Creatinine: 0.83 mg/dL (ref 0.60–1.10)
GFR, Estimated: >60 mL/min (ref >60)
GLUCOSE: 104 mg/dL (ref 70–140)
Potassium: 3.5 mmol/L (ref 3.5–5.1)
Sodium: 136 mmol/L (ref 136–145)
TOTAL PROTEIN: 8.1 g/dL (ref 6.4–8.3)

## 2017-06-29 LAB — VITAMIN B12: VITAMIN B 12: 155 pg/mL — AB (ref 180–914)

## 2017-06-29 LAB — IRON AND TIBC
Iron: 37 ug/dL — ABNORMAL LOW (ref 41–142)
SATURATION RATIOS: 10 % — AB (ref 21–57)
TIBC: 369 ug/dL (ref 236–444)
UIBC: 331 ug/dL

## 2017-06-29 LAB — RETICULOCYTES
RBC.: 4.78 MIL/uL (ref 3.70–5.45)
RETIC COUNT ABSOLUTE: 86 10*3/uL (ref 33.7–90.7)
Retic Ct Pct: 1.8 % (ref 0.7–2.1)

## 2017-06-29 LAB — FERRITIN: Ferritin: 122 ng/mL (ref 9–269)

## 2017-06-29 MED ORDER — POLYSACCHARIDE IRON COMPLEX 150 MG PO CAPS: 150.0000 mg | ORAL_CAPSULE | Freq: Every day | ORAL | 4 refills | Status: DC

## 2017-06-29 NOTE — Telephone Encounter (Signed)
Appointments scheduled AVS/Calendar printed per 6/10 los °

## 2017-06-30 ENCOUNTER — Telehealth: Payer: Self-pay

## 2017-06-30 LAB — VITAMIN D 25 HYDROXY (VIT D DEFICIENCY, FRACTURES): Vit D, 25-Hydroxy: <4 ng/mL — ABNORMAL LOW (ref 30.0–100.0)

## 2017-06-30 NOTE — Telephone Encounter (Signed)
Alexis Frockodd, Ludean Duhart V, CMA  Alexis Frockodd, Zoa Dowty V, CMA        CBC and Iron study 3 month repeat due. Labs are in already   Reminder for repeat labs. Pt had labs drawn yesterday at hematology. Please see Lab tab.

## 2017-07-02 ENCOUNTER — Telehealth: Payer: Self-pay | Admitting: Gastroenterology

## 2017-07-02 ENCOUNTER — Inpatient Hospital Stay: Payer: BC Managed Care – PPO

## 2017-07-02 NOTE — Telephone Encounter (Signed)
Spoke to patient who we referred to hematology for long term care of her anemia. Patient is wondering if it is possible to have you assume her iron deficiency needs or possibly her PCP?

## 2017-07-02 NOTE — Telephone Encounter (Signed)
I was happy to get her started on that treatment, but it really needs to be followed and treated long term by hematology or primary care.

## 2017-07-05 MED ORDER — ERGOCALCIFEROL 1.25 MG (50000 UT) PO CAPS: 50000.0000 [IU] | ORAL_CAPSULE | ORAL | 1 refills | Status: DC

## 2017-07-05 MED ORDER — B-12 1000 MCG SL SUBL: 2000.0000 ug | SUBLINGUAL_TABLET | Freq: Every day | SUBLINGUAL | 3 refills | Status: DC

## 2017-07-08 ENCOUNTER — Telehealth: Payer: Self-pay | Admitting: Hematology

## 2017-07-08 NOTE — Telephone Encounter (Signed)
Called pt re appts that was added per 6/18 sch msg - left vm for pt re appts.

## 2017-07-13 ENCOUNTER — Encounter: Payer: Self-pay | Admitting: Hematology

## 2017-07-16 ENCOUNTER — Inpatient Hospital Stay: Payer: BC Managed Care – PPO

## 2017-07-17 ENCOUNTER — Ambulatory Visit: Payer: BC Managed Care – PPO | Admitting: Family Medicine

## 2017-07-17 ENCOUNTER — Encounter: Payer: Self-pay | Admitting: Family Medicine

## 2017-07-17 VITALS — BP 118/62 | HR 76 | Temp 98.1°F | Ht 69.0 in | Wt 222.0 lb

## 2017-07-17 DIAGNOSIS — E611 Iron deficiency: Secondary | ICD-10-CM

## 2017-07-17 DIAGNOSIS — Z9884 Bariatric surgery status: Secondary | ICD-10-CM

## 2017-07-17 DIAGNOSIS — E559 Vitamin D deficiency, unspecified: Secondary | ICD-10-CM | POA: Diagnosis not present

## 2017-07-17 DIAGNOSIS — Z7689 Persons encountering health services in other specified circumstances: Secondary | ICD-10-CM | POA: Diagnosis not present

## 2017-07-17 DIAGNOSIS — E538 Deficiency of other specified B group vitamins: Secondary | ICD-10-CM | POA: Diagnosis not present

## 2017-07-17 NOTE — Progress Notes (Signed)
Patient presents to clinic today to establish care.  SUBJECTIVE: PMH: Pt is a 37 yo female with pmh sig for anemia,h/o gastric bypass.   Anemia: -told had decreased iron stores. -in the past has received IV iron infusions -pt states she does not want to go back to the cancer center as she had a bad experience there.  If she need Iron infusions in the future would prefer to go to the day center at [facility]. -taking iron polysaccharides 150 mg daily.  Pt endorses inconsistent supplement use. -denies constipation  H/o gastric bypass: -had roux-en-y in 2014 -pt was 312 lbs at her heaviest -not taking daily MVI -endorses h/o vitamin def. (D and B12) -followed by Dr. Loletha Carrow, gastroenterology  Fertility: -pt considering becoming pregnant in the near future -not taking PNV  Social hx: Pt is married.  She is an Tourist information centre manager by training.  She graduated from [employer].  Was teaching HS, now doing more admin.  Her husband is an Environmental consultant principal.  Pt endorses social EtOH use.  She denies tobacco and drug use.  Past Medical History:  Diagnosis Date  . Anxiety   . Depression   . Diabetes mellitus 05/2008   DR. BALAN  . GERD (gastroesophageal reflux disease)    after taking antibiotics it went away  . Hypertension   . Obesity     Past Surgical History:  Procedure Laterality Date  . BREATH TEK H PYLORI N/A 08/20/2012   Procedure: BREATH TEK H PYLORI;  Surgeon: Madilyn Hook, DO;  Location: [facility];  Service: Endoscopy;  Laterality: N/A;  . GASTRIC ROUX-EN-Y N/A 08/24/2012   Procedure: LAPAROSCOPIC ROUX-EN-Y GASTRIC BYPASS WITH UPPER ENDOSCOPY;  Surgeon: Madilyn Hook, DO;  Location: [facility];  Service: General;  Laterality: N/A;    Current Outpatient Medications on File Prior to Visit  Medication Sig Dispense Refill  . cetirizine (ZYRTEC) 10 MG tablet Take 10 mg by mouth daily.    . iron polysaccharides (NIFEREX) 150 MG capsule Take 1 capsule (150 mg total) by mouth daily. 30 capsule 4    . sertraline (ZOLOFT) 50 MG tablet Take 50 mg by mouth daily.    . Cyanocobalamin (B-12) 1000 MCG SUBL Place 2,000 mcg under the tongue daily. (Patient not taking: Reported on 07/17/2017) 30 each 3  . ergocalciferol (VITAMIN D2) 50000 units capsule Take 1 capsule (50,000 Units total) by mouth 2 (two) times a week. (Patient not taking: Reported on 07/17/2017) 32 capsule 1   No current facility-administered medications on file prior to visit.     Allergies  Allergen Reactions  . Metformin And Related Itching    Family History  Problem Relation Age of Onset  . Diabetes Mother   . Hypertension Mother   . Miscarriages / [place] Mother   . Diabetes Maternal Aunt   . Diabetes Maternal Grandfather   . Dementia Maternal Grandmother   . Diabetes Maternal Grandmother   . Mental illness Brother     Social History   Socioeconomic History  . Marital status: Single    Spouse name: Not on file  . Number of children: 0  . Years of education: Not on file  . Highest education level: Not on file  Occupational History  . Occupation: Tourist information centre manager  Social Needs  . Financial resource strain: Not on file  . Food insecurity:    Worry: Not on file    Inability: Not on file  . Transportation needs:    Medical: Not on file  Non-medical: Not on file  Tobacco Use  . Smoking status: Never Smoker  . Smokeless tobacco: Never Used  Substance and Sexual Activity  . Alcohol use: No    Alcohol/week: 0.0 oz  . Drug use: No  . Sexual activity: Yes    Birth control/protection: Condom    Comment: INTERCOUSRE AGE 35, MORE THAN 5 SEXUAL PARTNERS  Lifestyle  . Physical activity:    Days per week: Not on file    Minutes per session: Not on file  . Stress: Not on file  Relationships  . Social connections:    Talks on phone: Not on file    Gets together: Not on file    Attends religious service: Not on file    Active member of club or organization: Not on file    Attends meetings of clubs or  organizations: Not on file    Relationship status: Not on file  . Intimate partner violence:    Fear of current or ex partner: Not on file    Emotionally abused: Not on file    Physically abused: Not on file    Forced sexual activity: Not on file  Other Topics Concern  . Not on file  Social History Narrative  . Not on file    ROS General: Denies fever, chills, night sweats, changes in weight, changes in appetite +decreased energy, low iron stores. HEENT: Denies headaches, ear pain, changes in vision, rhinorrhea, sore throat CV: Denies CP, palpitations, SOB, orthopnea Pulm: Denies SOB, cough, wheezing GI: Denies abdominal pain, nausea, vomiting, diarrhea, constipation GU: Denies dysuria, hematuria, frequency, vaginal discharge Msk: Denies muscle cramps, joint pains Neuro: Denies weakness, numbness, tingling Skin: Denies rashes, bruising Psych: Denies depression, anxiety, hallucinations  BP 118/62 (BP Location: Right Arm, Patient Position: Sitting, Cuff Size: Normal)   Pulse 76   Temp 98.1 F (36.7 C) (Oral)   Ht _0  (1.753 m)   Wt 222 lb (100.7 kg)   LMP 07/05/2017 (Exact Date)   SpO2 97%   BMI 32.78 kg/m   Physical Exam Gen. Pleasant, well developed, well-nourished, in NAD HEENT - [place]/AT, PERRL, no scleral icterus, no nasal drainage, pharynx without erythema or exudate. TMs normal bilaterally.  No cervical lymphadenopathy. Lungs: no use of accessory muscles, CTAB, no wheezes, rales or rhonchi Cardiovascular: RRR, No r/g/m, no peripheral edema Abdomen: BS present, soft, nontender, nondistended Musculoskeletal: No deformities, moves all four extremities, no cyanosis or clubbing, normal tone Neuro:  A&Ox3, CN II-XII intact, normal gait Skin:  Warm, dry, intact, no lesions  Recent Results (from the past 2160 hour(s))  Iron Binding Cap (TIBC)     Status: None   Collection Time: 06/02/17  9:45 AM  Result Value Ref Range   Total Iron Binding Capacity 394 250 - 450 ug/dL    UIBC 285 131 - 425 ug/dL   Iron 109 27 - 159 ug/dL   Iron Saturation 28 15 - 55 %  Ferritin     Status: Abnormal   Collection Time: 06/02/17  9:45 AM  Result Value Ref Range   Ferritin 7.5 (L) 10.0 - 291.0 ng/mL  IBC panel     Status: None   Collection Time: 06/02/17  9:45 AM  Result Value Ref Range   Iron 122 42 - 145 ug/dL   Transferrin 321.0 212.0 - 360.0 mg/dL   Saturation Ratios 27.1 20.0 - 50.0 %  CBC with Differential/Platelet     Status: None   Collection Time: 06/02/17  9:45 AM  Result Value Ref Range   WBC 5.5 4.0 - 10.5 K/uL   RBC 4.51 3.87 - 5.11 Mil/uL   Hemoglobin 12.6 12.0 - 15.0 g/dL   HCT 37.8 36.0 - 46.0 %   MCV 84.0 78.0 - 100.0 fl   MCHC 33.2 30.0 - 36.0 g/dL   RDW 13.6 11.5 - 15.5 %   Platelets 287.0 150.0 - 400.0 K/uL   Neutrophils Relative % 49.5 43.0 - 77.0 %   Lymphocytes Relative 35.4 12.0 - 46.0 %   Monocytes Relative 11.0 3.0 - 12.0 %   Eosinophils Relative 3.7 0.0 - 5.0 %   Basophils Relative 0.4 0.0 - 3.0 %   Neutro Abs 2.7 1.4 - 7.7 K/uL   Lymphs Abs 1.9 0.7 - 4.0 K/uL   Monocytes Absolute 0.6 0.1 - 1.0 K/uL   Eosinophils Absolute 0.2 0.0 - 0.7 K/uL   Basophils Absolute 0.0 0.0 - 0.1 K/uL  Vitamin D 25 hydroxy     Status: Abnormal   Collection Time: 06/29/17  1:53 PM  Result Value Ref Range   Vit D, 25-Hydroxy <4.0 (L) 30.0 - 100.0 ng/mL    Comment: (NOTE) Vitamin D deficiency has been defined by the Institute of Medicine and an Endocrine Society practice guideline as a level of serum 25-OH vitamin D less than 20 ng/mL (1,2). The Endocrine Society went on to further define vitamin D insufficiency as a level between 21 and 29 ng/mL (2). 1. IOM (Institute of Medicine). 2010. Dietary reference   intakes for calcium and D. [place]: The   [employer]. 2. Holick MF, Binkley [place], Bischoff-Ferrari HA, et al.   Evaluation, treatment, and prevention of vitamin D   deficiency: an Endocrine Society clinical practice   guideline.  JCEM. 2011 Jul; 96(7):1911-30. Performed At: [facility] [place], [place] [id number] Rush Farmer MD [id number] Performed at [facility], [place] [address], [place], [place] [address]   Vitamin B12     Status: Abnormal   Collection Time: 06/29/17  1:53 PM  Result Value Ref Range   Vitamin B-12 155 (L) 180 - 914 pg/mL    Comment: (NOTE) This assay is not validated for testing neonatal or myeloproliferative syndrome specimens for Vitamin B12 levels. Performed at [facility], [place] [address], [place], [place] [address]   Iron and TIBC     Status: Abnormal   Collection Time: 06/29/17  1:53 PM  Result Value Ref Range   Iron 37 (L) 41 - 142 ug/dL   TIBC 369 236 - 444 ug/dL   Saturation Ratios 10 (L) 21 - 57 %   UIBC 331 ug/dL    Comment: Performed at [facility], [place] [address], [place], [place] [address]  Ferritin     Status: None   Collection Time: 06/29/17  1:53 PM  Result Value Ref Range   Ferritin 122 9 - 269 ng/mL    Comment: Performed at [facility], [place] [address] [place],  [address]  CMP ([place] only)     Status: Abnormal   Collection Time: 06/29/17  1:53 PM  Result Value Ref Range   Sodium 136 136 - 145 mmol/L   Potassium 3.5 3.5 - 5.1 mmol/L   Chloride 105 98 - 109 mmol/L   CO2 24 22 - 29 mmol/L   Glucose, Bld 104 70 - 140 mg/dL   BUN 8 7 - 26 mg/dL   Creatinine 0.83  0.60 - 1.10 mg/dL   Calcium 9.0 8.4 - 10.4 mg/dL   Total Protein 8.1 6.4 - 8.3 g/dL   Albumin 3.9 3.5 - 5.0 g/dL   AST 30 5 - 34 U/L   ALT 42 0 - 55 U/L   Alkaline Phosphatase 172 (H) 40 - 150 U/L   Total Bilirubin 0.6 0.2 - 1.2 mg/dL   GFR, Est Non Af Am >60 >60 mL/min   GFR, Est AFR Am >60 >60 mL/min    Comment: (NOTE) The eGFR has been calculated using the CKD EPI equation. This calculation has not been validated in all clinical situations. eGFR's  persistently <60 mL/min signify possible Chronic Kidney Disease.    Anion gap 7 3 - 11    Comment: Performed at [facility], [address], [place], [place] [address]  Reticulocytes     Status: None   Collection Time: 06/29/17  1:53 PM  Result Value Ref Range   Retic Ct Pct 1.8 0.7 - 2.1 %   RBC. 4.78 3.70 - 5.45 MIL/uL   Retic Count, Absolute 86.0 33.7 - 90.7 K/uL    Comment: Performed at [facility], [place] [address], [place], [place] [address]  CBC with Differential/Platelet     Status: Abnormal   Collection Time: 06/29/17  1:53 PM  Result Value Ref Range   WBC 9.4 3.9 - 10.3 K/uL   RBC 4.80 3.70 - 5.45 MIL/uL   Hemoglobin 13.8 11.6 - 15.9 g/dL   HCT 41.8 34.8 - 46.6 %   MCV 87.1 79.5 - 101.0 fL   MCH 28.7 25.1 - 34.0 pg   MCHC 33.0 31.5 - 36.0 g/dL   RDW 16.2 (H) 11.2 - 14.5 %   Platelets 264 145 - 400 K/uL   Neutrophils Relative % 81 %   Neutro Abs 7.7 (H) 1.5 - 6.5 K/uL   Lymphocytes Relative 10 %   Lymphs Abs 0.9 0.9 - 3.3 K/uL   Monocytes Relative 7 %   Monocytes Absolute 0.7 0.1 - 0.9 K/uL   Eosinophils Relative 2 %   Eosinophils Absolute 0.2 0.0 - 0.5 K/uL   Basophils Relative 0 %   Basophils Absolute 0.0 0.0 - 0.1 K/uL    Comment: Performed at [facility], [place] [address], [place], [place] [address]    Assessment/Plan: Vitamin B12 deficiency -possibly 2/2 roux en y procedure -will continue supplementation with 2000 mcg sublingual. -will recheck vit b12 in several wks.  Iron deficiency -continue ferrous polysaccharide 150 mg daily -ok to use miralax prn for constipation  Vitamin D deficiency -pt to continue ergocalciferol 50,000 IU weekly -will recheck vit D in 8-12 wks after completion of supplementation.  H/O gastric bypass -advised to take daily MVI indefinitely  Encounter to establish care Anticipatory guidance given including wearing seatbelts, smoke detectors in the home,  increasing physical activity, increasing p.o. intake of water and vegetables.  F/u in 6-8 wks for labs to recheck vitamin levels after sufficient supplementation.  Sooner if needed.  Grier Mitts, MD

## 2017-07-17 NOTE — Patient Instructions (Addendum)
Please pick up the following prescriptions from your [facility] on [facility]: Ergocalciferol (vitamin D) 50,000 IU, cyanocobalamin (B12) 2000 mcg, and iron polysaccharides 150 mg.  Please take them consistently.  We can then recheck her vitamin levels in 6 to 8 weeks to see if you require additional supplementation. Iron-Rich Diet Iron is a mineral that helps your body to produce hemoglobin. Hemoglobin is a protein in your red blood cells that carries oxygen to your body's tissues. Eating too little iron may cause you to feel weak and tired, and it can increase your risk for infection. Eating enough iron is necessary for your body's metabolism, muscle function, and nervous system. Iron is naturally found in many foods. It can also be added to foods or fortified in foods. There are two types of dietary iron:  Heme iron. Heme iron is absorbed by the body more easily than nonheme iron. Heme iron is found in meat, poultry, and fish.  Nonheme iron. Nonheme iron is found in dietary supplements, iron-fortified grains, beans, and vegetables.  You may need to follow an iron-rich diet if:  You have been diagnosed with iron deficiency or iron-deficiency anemia.  You have a condition that prevents you from absorbing dietary iron, such as: ? Infection in your intestines. ? Celiac disease. This involves long-lasting (chronic) inflammation of your intestines.  You do not eat enough iron.  You eat a diet that is high in foods that impair iron absorption.  You have lost a lot of blood.  You have heavy bleeding during your menstrual cycle.  You are pregnant.  What is my plan? Your health care provider may help you to determine how much iron you need per day based on your condition. Generally, when a person consumes sufficient amounts of iron in the diet, the following iron needs are met:  Men. ? 29-30 years old: 11 mg per day. ? 65-38 years old: 8 mg per day.  Women. ? 44-14 years old: 15 mg  per day. ? 62-77 years old: 18 mg per day. ? Over 80 years old: 8 mg per day. ? Pregnant women: 27 mg per day. ? Breastfeeding women: 9 mg per day.  What do I need to know about an iron-rich diet?  Eat fresh fruits and vegetables that are high in vitamin C along with foods that are high in iron. This will help increase the amount of iron that your body absorbs from food, especially with foods containing nonheme iron. Foods that are high in vitamin C include oranges, peppers, tomatoes, and mango.  Take iron supplements only as directed by your health care provider. Overdose of iron can be life-threatening. If you were prescribed iron supplements, take them with orange juice or a vitamin C supplement.  Cook foods in pots and pans that are made from iron.  Eat nonheme iron-containing foods alongside foods that are high in heme iron. This helps to improve your iron absorption.  Certain foods and drinks contain compounds that impair iron absorption. Avoid eating these foods in the same meal as iron-rich foods or with iron supplements. These include: ? Coffee, black tea, and red wine. ? Milk, dairy products, and foods that are high in calcium. ? Beans, soybeans, and peas. ? Whole grains.  When eating foods that contain both nonheme iron and compounds that impair iron absorption, follow these tips to absorb iron better. ? Soak beans overnight before cooking. ? Soak whole grains overnight and drain them before using. ? Ferment flours  before baking, such as using yeast in bread dough. What foods can I eat? Grains Iron-fortified breakfast cereal. Iron-fortified whole-wheat bread. Enriched rice. Sprouted grains. Vegetables Spinach. Potatoes with skin. Green peas. Broccoli. Red and green bell peppers. Fermented vegetables. Fruits Prunes. Raisins. Oranges. Strawberries. Mango. Grapefruit. Meats and Other Protein Sources Beef liver. Oysters. Beef. Shrimp. [place]. Chicken. [place]. Sardines.  [place]. Nuts. Tofu. Beverages Tomato juice. Fresh orange juice. Prune juice. Hibiscus tea. Fortified instant breakfast shakes. Condiments Tahini. Fermented soy sauce. Sweets and Desserts Black-strap molasses. Other Wheat germ. The items listed above may not be a complete list of recommended foods or beverages. Contact your dietitian for more options. What foods are not recommended? Grains Whole grains. Bran cereal. Bran flour. Oats. Vegetables Artichokes. Brussels sprouts. Kale. Fruits Blueberries. Raspberries. Strawberries. Figs. Meats and Other Protein Sources Soybeans. Products made from soy protein. Dairy Milk. Cream. Cheese. Yogurt. Cottage cheese. Beverages Coffee. Black tea. Red wine. Sweets and Desserts Cocoa. Chocolate. Ice cream. Other Basil. Oregano. Parsley. The items listed above may not be a complete list of foods and beverages to avoid. Contact your dietitian for more information. This information is not intended to replace advice given to you by your health care provider. Make sure you discuss any questions you have with your health care provider. Document Released: 08/20/2004 Document Revised: 07/27/2015 Document Reviewed: 08/03/2013 Elsevier Interactive Patient Education  2018 [employer].  Vitamin D Deficiency Vitamin D deficiency is when your body does not have enough vitamin D. Vitamin D is important to your body for many reasons:  It helps the body to absorb two important minerals, called calcium and phosphorus.  It plays a role in bone health.  It may help to prevent some diseases, such as diabetes and multiple sclerosis.  It plays a role in muscle function, including heart function.  You can get vitamin D by:  Eating foods that naturally contain vitamin D.  Eating or drinking milk or other dairy products that have vitamin D added to them.  Taking a vitamin D supplement or a multivitamin supplement that contains vitamin D.  Being in the  sun. Your body naturally makes vitamin D when your skin is exposed to sunlight. Your body changes the sunlight into a form of the vitamin that the body can use.  If vitamin D deficiency is severe, it can cause a condition in which your bones become soft. In adults, this condition is called osteomalacia. In children, this condition is called rickets. What are the causes? Vitamin D deficiency may be caused by:  Not eating enough foods that contain vitamin D.  Not getting enough sun exposure.  Having certain digestive system diseases that make it difficult for your body to absorb vitamin D. These diseases include Crohn disease, chronic pancreatitis, and cystic fibrosis.  Having a surgery in which a part of the stomach or a part of the small intestine is removed.  Being obese.  Having chronic kidney disease or liver disease.  What increases the risk? This condition is more likely to develop in:  Older people.  People who do not spend much time outdoors.  People who live in a long-term care facility.  People who have had broken bones.  People with weak or thin bones (osteoporosis).  People who have a disease or condition that changes how the body absorbs vitamin D.  People who have dark skin.  People who take certain medicines, such as steroid medicines or certain seizure medicines.  People who are overweight or  obese.  What are the signs or symptoms? In mild cases of vitamin D deficiency, there may not be any symptoms. If the condition is severe, symptoms may include:  Bone pain.  Muscle pain.  Falling often.  Broken bones caused by a minor injury.  How is this diagnosed? This condition is usually diagnosed with a blood test. How is this treated? Treatment for this condition may depend on what caused the condition. Treatment options include:  Taking vitamin D supplements.  Taking a calcium supplement. Your health care provider will suggest what dose is best for  you.  Follow these instructions at home:  Take medicines and supplements only as told by your health care provider.  Eat foods that contain vitamin D. Choices include: ? Fortified dairy products, cereals, or juices. Fortified means that vitamin D has been added to the food. Check the label on the package to be sure. ? Fatty fish, such as salmon or trout. ? Eggs. ? Oysters.  Do not use a tanning bed.  Maintain a healthy weight. Lose weight, if needed.  Keep all follow-up visits as told by your health care provider. This is important. Contact a health care provider if:  Your symptoms do not go away.  You feel like throwing up (nausea) or you throw up (vomit).  You have fewer bowel movements than usual or it is difficult for you to have a bowel movement (constipation). This information is not intended to replace advice given to you by your health care provider. Make sure you discuss any questions you have with your health care provider. Document Released: 03/31/2011 Document Revised: 06/20/2015 Document Reviewed: 05/24/2014 Elsevier Interactive Patient Education  2018 [employer].  Preparing for Pregnancy If you are considering becoming pregnant, make an appointment to see your regular health care provider to learn how to prepare for a safe and healthy pregnancy (preconception care). During a preconception care visit, your health care provider will:  Do a complete physical exam, including a Pap test.  Take a complete medical history.  Give you information, answer your questions, and help you resolve problems.  Preconception checklist Medical history  Tell your health care provider about any current or past medical conditions. Your pregnancy or your ability to become pregnant may be affected by chronic conditions, such as diabetes, chronic hypertension, and thyroid problems.  Include your family's medical history as well as your partner's medical history.  Tell your health  care provider about any history of STIs (sexually transmitted infections).These can affect your pregnancy. In some cases, they can be passed to your baby. Discuss any concerns that you have about STIs.  If indicated, discuss the benefits of genetic testing. This testing will show whether there are any genetic conditions that may be passed from you or your partner to your baby.  Tell your health care provider about: ? Any problems you have had with conception or pregnancy. ? Any medicines you take. These include vitamins, herbal supplements, and over-the-counter medicines. ? Your history of immunizations. Discuss any vaccinations that you may need.  Diet  Ask your health care provider what to include in a healthy diet that has a balance of nutrients. This is especially important when you are pregnant or preparing to become pregnant.  Ask your health care provider to help you reach a healthy weight before pregnancy. ? If you are overweight, you may be at higher risk for certain complications, such as high blood pressure, diabetes, and preterm birth. ? If you are  underweight, you are more likely to have a baby who has a low birth weight.  Lifestyle, work, and home  Let your health care provider know: ? About any lifestyle habits that you have, such as alcohol use, drug use, or smoking. ? About recreational activities that may put you at risk during pregnancy, such as downhill skiing and certain exercise programs. ? Tell your health care provider about any international travel, especially any travel to places with an active [place] virus outbreak. ? About harmful substances that you may be exposed to at work or at home. These include chemicals, pesticides, radiation, or even litter boxes. ? If you do not feel safe at home.  Mental health  Tell your health care provider about: ? Any history of mental health conditions, including feelings of depression, sadness, or anxiety. ? Any medicines that  you take for a mental health condition. These include herbs and supplements.  Home instructions to prepare for pregnancy Lifestyle  Eat a balanced diet. This includes fresh fruits and vegetables, whole grains, lean meats, low-fat dairy products, healthy fats, and foods that are high in fiber. Ask to meet with a nutritionist or registered dietitian for assistance with meal planning and goals.  Get regular exercise. Try to be active for at least 30 minutes a day on most days of the week. Ask your health care provider which activities are safe during pregnancy.  Do not use any products that contain nicotine or tobacco, such as cigarettes and e-cigarettes. If you need help quitting, ask your health care provider.  Do not drink alcohol.  Do not take illegal drugs.  Maintain a healthy weight. Ask your health care provider what weight range is right for you.  General instructions  Keep an accurate record of your menstrual periods. This makes it easier for your health care provider to determine your baby's due date.  Begin taking prenatal vitamins and folic acid supplements daily as directed by your health care provider.  Manage any chronic conditions, such as high blood pressure and diabetes, as told by your health care provider. This is important.  How do I know that I am pregnant? You may be pregnant if you have been sexually active and you miss your period. Symptoms of early pregnancy include:  Mild cramping.  Very light vaginal bleeding (spotting).  Feeling unusually tired.  Nausea and vomiting (morning sickness).  If you have any of these symptoms and you suspect that you might be pregnant, you can take a home pregnancy test. These tests check for a hormone in your urine (human chorionic gonadotropin, or hCG). A woman's body begins to make this hormone during early pregnancy. These tests are very accurate. Wait until at least the first day after you miss your period to take one. If  the test shows that you are pregnant (you get a positive result), call your health care provider to make an appointment for prenatal care. What should I do if I become pregnant?  Make an appointment with your health care provider as soon as you suspect you are pregnant.  Do not use any products that contain nicotine, such as cigarettes, chewing tobacco, and e-cigarettes. If you need help quitting, ask your health care provider.  Do not drink alcoholic beverages. Alcohol is related to a number of birth defects.  Avoid toxic odors and chemicals.  You may continue to have sexual intercourse if it does not cause pain or other problems, such as vaginal bleeding. This information is not  intended to replace advice given to you by your health care provider. Make sure you discuss any questions you have with your health care provider. Document Released: 12/20/2007 Document Revised: 09/04/2015 Document Reviewed: 07/29/2015 Elsevier Interactive Patient Education  2018 [employer].  Folic Acid and Pregnancy What is folic acid? Folic acid is a B vitamin. Your body needs it to make new cells. Folic acid is also called folate. Folate is the form of the B vitamin that is found naturally in food. Folic acid is the artificial (synthetic) form of the B vitamin. Folic acid is added to certain foods (fortified foods) and is also available in dietary supplements such as prenatal vitamins. All women who may become pregnant or are planning to become pregnant need at least 222-979 mcg of folic acid daily. Most pregnant women need 892-119 mcg of folic acid per day, but some women need more. What are the benefits of taking folic acid during pregnancy? Taking folic acid during pregnancy helps to prevent abnormalities that can develop in an unborn baby's brain, spine, or spinal column (neural tube defects). These defects include:  Spina bifida. This is when the spinal column does not close completely during development,  leaving the spinal cord exposed. This means the nerves that control leg movements and other bodily functions do not work. Spina bifida causes lifelong disabilities.  Anencephaly. Babies born with anencephaly have an underdeveloped brain. They may have little or no brain matter, and they could also be missing parts of the skull.  Neural tube defects occur in the first few months (first trimester) of pregnancy. If you are trying to get pregnant, make sure you get enough folic acid for at least one month before you start trying. It is important to get enough folic acid even if you are not trying to get pregnant, because some pregnancies are unplanned.If your pregnancy is unplanned, start taking folic acid as soon as you find out that you are pregnant. Folic acid can also help to prevent a drop in red blood cells that carry oxygen throughout the body (anemia). Anemia during pregnancy is associated with complications such as low birth weight and premature birth. What are the side effects of taking folic acid? Folic acid supplements may cause side effects, such as:  Diarrhea.  Abdominal cramping.  Folic acid can also interact with certain medicines that are used to treat other conditions. These medicines include:  Methotrexate. This is an anticancer drug that is also used to treat some autoimmune diseases.  Antiepileptic medicine, such as phenytoin, carbamazepine, and valproate.  Sulfasalazine. This medicine is used to treat ulcerative colitis.  How should I take folic acid during pregnancy? Even women who have a healthy, well-balanced diet may not get enough folate from food. Synthetic folic acid is easier for your body to use than the folate that is found naturally in certain foods. In addition to eating folate-rich foods, you can ensure that you get enough folic acid by taking prenatal vitamins and eating foods that are fortified with folic acid. Make sure your prenatal vitamin or B vitamin  supplement contains 400-800 micrograms of folic acid. What foods should I eat? Folate is found naturally in:  Dark green leafy vegetables, such as spinach.  Asparagus.  Brussels sprouts.  Citrus fruits and juices.  Nuts.  Beans.  Peas.  Eggs.  Meat, poultry, and seafood.  Soy products.  Whole grains.  Folic acid is often added to certain foods, including:  Bread.  Pasta.  White rice.  Breakfast cereal.  Flour.  Cornmeal.  When should I seek medical care? Some women may need to take more than the recommended amount of folic acid. Talk with your health care provider about your folic acid needs if:  You had a baby with a neural tube defect and you want to get pregnant again.  You have a family history of spina bifida.  You have spina bifida and you want to get pregnant.  Talk with your doctor about folic acid supplements if you are taking medicine for any of the following conditions:  Epilepsy.  Type 2 diabetes.  Autoimmune diseases, including rheumatoid arthritis, lupus, psoriasis, celiac disease, and inflammatory bowel disease.  Asthma.  Kidney disease.  Liver disease.  Sickle cell disease.  This information is not intended to replace advice given to you by your health care provider. Make sure you discuss any questions you have with your health care provider. Document Released: 01/09/2003 Document Revised: 12/03/2015 Document Reviewed: 09/20/2014 Elsevier Interactive Patient Education  2017 [employer].

## 2017-07-21 ENCOUNTER — Encounter: Payer: Self-pay | Admitting: Family Medicine

## 2017-07-21 DIAGNOSIS — E538 Deficiency of other specified B group vitamins: Secondary | ICD-10-CM | POA: Insufficient documentation

## 2017-07-21 DIAGNOSIS — E611 Iron deficiency: Secondary | ICD-10-CM | POA: Insufficient documentation

## 2017-07-21 DIAGNOSIS — E559 Vitamin D deficiency, unspecified: Secondary | ICD-10-CM | POA: Insufficient documentation

## 2017-08-19 ENCOUNTER — Encounter: Payer: Self-pay | Admitting: Family Medicine

## 2017-08-28 ENCOUNTER — Ambulatory Visit: Payer: BC Managed Care – PPO | Admitting: Family Medicine

## 2017-08-28 ENCOUNTER — Encounter: Payer: Self-pay | Admitting: Family Medicine

## 2017-08-28 VITALS — BP 108/78 | HR 74 | Temp 97.7°F | Wt 225.0 lb

## 2017-08-28 DIAGNOSIS — E559 Vitamin D deficiency, unspecified: Secondary | ICD-10-CM

## 2017-08-28 DIAGNOSIS — D508 Other iron deficiency anemias: Secondary | ICD-10-CM | POA: Diagnosis not present

## 2017-08-28 DIAGNOSIS — E538 Deficiency of other specified B group vitamins: Secondary | ICD-10-CM

## 2017-08-28 LAB — VITAMIN B12: Vitamin B-12: 1446 pg/mL — ABNORMAL HIGH (ref 211–911)

## 2017-08-28 LAB — FERRITIN: FERRITIN: 17.8 ng/mL (ref 10.0–291.0)

## 2017-08-28 LAB — VITAMIN D 25 HYDROXY (VIT D DEFICIENCY, FRACTURES): VITD: 12.16 ng/mL — ABNORMAL LOW (ref 30.00–100.00)

## 2017-08-28 NOTE — Patient Instructions (Addendum)
Don't forget to pick up your iron and prenatal vitamins. Folic Acid and Pregnancy What is folic acid? Folic acid is a B vitamin. Your body needs it to make new cells. Folic acid is also called folate. Folate is the form of the B vitamin that is found naturally in food. Folic acid is the artificial (synthetic) form of the B vitamin. Folic acid is added to certain foods (fortified foods) and is also available in dietary supplements such as prenatal vitamins. All women who may become pregnant or are planning to become pregnant need at least 400-800 mcg of folic acid daily. Most pregnant women need 600-800 mcg of folic acid per day, but some women need more. What are the benefits of taking folic acid during pregnancy? Taking folic acid during pregnancy helps to prevent abnormalities that can develop in an unborn baby's brain, spine, or spinal column (neural tube defects). These defects include:  Spina bifida. This is when the spinal column does not close completely during development, leaving the spinal cord exposed. This means the nerves that control leg movements and other bodily functions do not work. Spina bifida causes lifelong disabilities.  Anencephaly. Babies born with anencephaly have an underdeveloped brain. They may have little or no brain matter, and they could also be missing parts of the skull.  Neural tube defects occur in the first few months (first trimester) of pregnancy. If you are trying to get pregnant, make sure you get enough folic acid for at least one month before you start trying. It is important to get enough folic acid even if you are not trying to get pregnant, because some pregnancies are unplanned.If your pregnancy is unplanned, start taking folic acid as soon as you find out that you are pregnant. Folic acid can also help to prevent a drop in red blood cells that carry oxygen throughout the body (anemia). Anemia during pregnancy is associated with complications such as low  birth weight and premature birth. What are the side effects of taking folic acid? Folic acid supplements may cause side effects, such as:  Diarrhea.  Abdominal cramping.  Folic acid can also interact with certain medicines that are used to treat other conditions. These medicines include:  Methotrexate. This is an anticancer drug that is also used to treat some autoimmune diseases.  Antiepileptic medicine, such as phenytoin, carbamazepine, and valproate.  Sulfasalazine. This medicine is used to treat ulcerative colitis.  How should I take folic acid during pregnancy? Even women who have a healthy, well-balanced diet may not get enough folate from food. Synthetic folic acid is easier for your body to use than the folate that is found naturally in certain foods. In addition to eating folate-rich foods, you can ensure that you get enough folic acid by taking prenatal vitamins and eating foods that are fortified with folic acid. Make sure your prenatal vitamin or B vitamin supplement contains 400-800 micrograms of folic acid. What foods should I eat? Folate is found naturally in:  Dark green leafy vegetables, such as spinach.  Asparagus.  Brussels sprouts.  Citrus fruits and juices.  Nuts.  Beans.  Peas.  Eggs.  Meat, poultry, and seafood.  Soy products.  Whole grains.  Folic acid is often added to certain foods, including:  Bread.  Pasta.  White rice.  Breakfast cereal.  Flour.  Cornmeal.  When should I seek medical care? Some women may need to take more than the recommended amount of folic acid. Talk with your health care provider  about your folic acid needs if:  You had a baby with a neural tube defect and you want to get pregnant again.  You have a family history of spina bifida.  You have spina bifida and you want to get pregnant.  Talk with your doctor about folic acid supplements if you are taking medicine for any of the following  conditions:  Epilepsy.  Type 2 diabetes.  Autoimmune diseases, including rheumatoid arthritis, lupus, psoriasis, celiac disease, and inflammatory bowel disease.  Asthma.  Kidney disease.  Liver disease.  Sickle cell disease.  This information is not intended to replace advice given to you by your health care provider. Make sure you discuss any questions you have with your health care provider. Document Released: 01/09/2003 Document Revised: 12/03/2015 Document Reviewed: 09/20/2014 Elsevier Interactive Patient Education  2017 [employer].

## 2017-08-28 NOTE — Progress Notes (Signed)
Subjective:    Patient ID: [name] [name], female    DOB: 03/25/1980, 37 y.o.   MRN: [id number]  No chief complaint on file.   HPI Patient was seen today for f/u on vit d and b12.  Overall pt has been doing well.  Has been taking Cyanocobalamin 1000 mcg sublingual and ergocalciferol 50000 IUs.  Pt was unable to take iron as rx was not available.  In the past was receiving IV iron infusions.  Was unaware or recent infusion appt.  Pt taking MVI occasionally, needs to get more.     Pt and her husband are trying to conceive x 3 months.  Pt plans to f/u with ob/gyn next month.  Taking Zoloft 50 mg daily.  Past Medical History:  Diagnosis Date  . Anxiety   . Depression   . Diabetes mellitus 05/2008   DR. BALAN  . GERD (gastroesophageal reflux disease)    after taking antibiotics it went away  . Hypertension   . Obesity     Allergies  Allergen Reactions  . Metformin And Related Itching    ROS General: Denies fever, chills, night sweats, changes in weight, changes in appetite HEENT: Denies headaches, ear pain, changes in vision, rhinorrhea, sore throat CV: Denies CP, palpitations, SOB, orthopnea Pulm: Denies SOB, cough, wheezing GI: Denies abdominal pain, nausea, vomiting, diarrhea, constipation GU: Denies dysuria, hematuria, frequency, vaginal discharge Msk: Denies muscle cramps, joint pains Neuro: Denies weakness, numbness, tingling Skin: Denies rashes, bruising Psych: Denies depression, anxiety, hallucinations     Objective:    Blood pressure 108/78, pulse 74, temperature 97.7 F (36.5 C), temperature source Oral, weight 225 lb (102.1 kg), SpO2 98 %.   Gen. Pleasant, well-nourished, in no distress, normal affect   HEENT: [place]/AT, face symmetric, no scleral icterus Lungs: no accessory muscle use Cardiovascular: RRR, no peripheral edema Neuro:  A&Ox3, CN II-XII intact, normal gait   Wt Readings from Last 3 Encounters:  08/28/17 225 lb (102.1 kg)  07/17/17 222 lb  (100.7 kg)  06/29/17 224 lb 9.6 oz (101.9 kg)    Lab Results  Component Value Date   WBC 9.4 06/29/2017   HGB 13.8 06/29/2017   HCT 41.8 06/29/2017   PLT 264 06/29/2017   GLUCOSE 104 06/29/2017   CHOL 153 02/15/2014   TRIG 61 02/15/2014   HDL 73 02/15/2014   LDLCALC 68 02/15/2014   ALT 42 06/29/2017   AST 30 06/29/2017   NA 136 06/29/2017   K 3.5 06/29/2017   CL 105 06/29/2017   CREATININE 0.83 06/29/2017   BUN 8 06/29/2017   CO2 24 06/29/2017   TSH 1.57 02/03/2017   HGBA1C 6.5 (H) 02/09/2017    Assessment/Plan:  Other iron deficiency anemia  -encouraged to pick up rx for iron polysaccharides 150 mg - Plan: Ferritin, Iron, TIBC and Ferritin Panel  Vitamin B12 deficiency  -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  May need to switch from Zoloft or do a trial off of med as pregnancy category C.  F/u prn based on lab results.  Abbe AmsterdamShannon Yakir Wenke, MD

## 2017-08-29 LAB — IRON,TIBC AND FERRITIN PANEL
%SAT: 14 % — AB (ref 16–45)
FERRITIN: 18 ng/mL (ref 16–154)
Iron: 47 ug/dL (ref 40–190)
TIBC: 344 ug/dL (ref 250–450)

## 2017-09-02 ENCOUNTER — Other Ambulatory Visit: Payer: Self-pay | Admitting: Family Medicine

## 2017-09-02 DIAGNOSIS — E559 Vitamin D deficiency, unspecified: Secondary | ICD-10-CM

## 2017-09-02 MED ORDER — VITAMIN D (ERGOCALCIFEROL) 1.25 MG (50000 UNIT) PO CAPS: 50000.0000 [IU] | ORAL_CAPSULE | ORAL | 0 refills | Status: DC

## 2017-09-03 ENCOUNTER — Other Ambulatory Visit: Payer: Self-pay

## 2017-09-03 MED ORDER — POLYSACCHARIDE IRON COMPLEX 150 MG PO CAPS: 150.0000 mg | ORAL_CAPSULE | Freq: Every day | ORAL | 4 refills | Status: DC

## 2017-10-06 ENCOUNTER — Ambulatory Visit: Payer: BC Managed Care – PPO | Admitting: Women's Health

## 2017-10-15 ENCOUNTER — Other Ambulatory Visit: Payer: Self-pay | Admitting: Family Medicine

## 2017-10-15 DIAGNOSIS — E559 Vitamin D deficiency, unspecified: Secondary | ICD-10-CM

## 2017-10-15 NOTE — Telephone Encounter (Signed)
This Rx as for a short time of 12 weeks and then start taking taking OTC vitamin D, pt is aware.

## 2017-10-27 ENCOUNTER — Telehealth: Payer: Self-pay | Admitting: Hematology

## 2017-10-27 NOTE — Telephone Encounter (Signed)
Returned pts call to cancel appts. Will call to r/a

## 2017-10-29 ENCOUNTER — Other Ambulatory Visit: Payer: Self-pay

## 2017-11-05 ENCOUNTER — Other Ambulatory Visit: Payer: Self-pay | Admitting: Hematology

## 2017-11-05 DIAGNOSIS — E559 Vitamin D deficiency, unspecified: Secondary | ICD-10-CM

## 2017-11-13 ENCOUNTER — Ambulatory Visit: Payer: Self-pay | Admitting: Hematology

## 2017-11-20 ENCOUNTER — Ambulatory Visit: Payer: Self-pay | Admitting: Family Medicine

## 2017-12-19 ENCOUNTER — Other Ambulatory Visit: Payer: Self-pay | Admitting: Family Medicine

## 2018-01-21 ENCOUNTER — Ambulatory Visit: Payer: Self-pay | Admitting: Family Medicine

## 2018-01-22 ENCOUNTER — Ambulatory Visit: Payer: Self-pay | Admitting: Family Medicine

## 2018-01-27 ENCOUNTER — Ambulatory Visit: Payer: Self-pay | Admitting: Family Medicine

## 2018-03-18 ENCOUNTER — Telehealth: Payer: Self-pay | Admitting: *Deleted

## 2018-03-18 ENCOUNTER — Other Ambulatory Visit: Payer: Self-pay | Admitting: Hematology

## 2018-03-18 NOTE — Telephone Encounter (Signed)
Advise refill. No future appointments.  New pt visit 06-29-2017.  Patient cancelled 10-29-2017 Lab and 11-13-2017 F/U.  Note most recent Primary Care lab results drawn August 28, 2017.

## 2018-03-18 NOTE — Telephone Encounter (Signed)
Patient no showed for her f/u which was supposed to be 4 months after her appointment in 06/2017. She needs to f/u with [place] -- next available with labs (CBC, ferritin, iron profile, B12) or f/u with PCP.  [name] [name]

## 2018-03-18 NOTE — Telephone Encounter (Signed)
Asked [facility] if correct request for [name] [name] 03-20-1980, [phone] [address] for B12 Vitamin SubL for this patient with different name and address.  [facility] same [facility].

## 2018-03-19 ENCOUNTER — Ambulatory Visit: Payer: Self-pay | Admitting: Gastroenterology

## 2018-03-19 NOTE — Telephone Encounter (Signed)
Voicemail left for [facility] this order refused.  Patient needs to contact provider.

## 2018-03-31 ENCOUNTER — Encounter: Payer: Self-pay | Admitting: Family Medicine

## 2018-03-31 ENCOUNTER — Other Ambulatory Visit: Payer: Self-pay

## 2018-03-31 ENCOUNTER — Ambulatory Visit: Payer: BC Managed Care – PPO | Admitting: Family Medicine

## 2018-03-31 VITALS — BP 98/78 | HR 88 | Temp 98.5°F | Wt 225.0 lb

## 2018-03-31 DIAGNOSIS — E559 Vitamin D deficiency, unspecified: Secondary | ICD-10-CM | POA: Diagnosis not present

## 2018-03-31 DIAGNOSIS — R5383 Other fatigue: Secondary | ICD-10-CM

## 2018-03-31 LAB — VITAMIN D 25 HYDROXY (VIT D DEFICIENCY, FRACTURES): VITD: 35.97 ng/mL (ref 30.00–100.00)

## 2018-03-31 LAB — CBC
HCT: 37.5 % (ref 36.0–46.0)
HEMOGLOBIN: 12.2 g/dL (ref 12.0–15.0)
MCHC: 32.5 g/dL (ref 30.0–36.0)
MCV: 81.1 fl (ref 78.0–100.0)
Platelets: 350 10*3/uL (ref 150.0–400.0)
RBC: 4.62 Mil/uL (ref 3.87–5.11)
RDW: 14.3 % (ref 11.5–15.5)
WBC: 8.3 10*3/uL (ref 4.0–10.5)

## 2018-03-31 LAB — VITAMIN B12: VITAMIN B 12: 698 pg/mL (ref 211–911)

## 2018-03-31 LAB — IBC + FERRITIN
FERRITIN: 7.5 ng/mL — AB (ref 10.0–291.0)
Iron: 68 ug/dL (ref 42–145)
SATURATION RATIOS: 12.8 % — AB (ref 20.0–50.0)
TRANSFERRIN: 378 mg/dL — AB (ref 212.0–360.0)

## 2018-03-31 MED ORDER — CETIRIZINE HCL 10 MG PO TABS: 10.0000 mg | ORAL_TABLET | Freq: Every day | ORAL | 3 refills | Status: DC

## 2018-03-31 NOTE — Progress Notes (Signed)
Subjective:    Patient ID: [name] [name], female    DOB: 1980/11/27, 38 y.o.   MRN: [id number]  No chief complaint on file.   HPI  Pt is a 38 yo female with pmh sig for h/o gastric bypass, anemia, Vit D def, and Vit B12 def.  Patient was seen today for follow-up.  Pt endorses feeling "foggy in the brain", fatigue, and blurred vision.  The last time pt felt this way her iron and vitamin D were low.  Taking cyanocobalamin 1000 mcg sublingual and ergocalciferol daily.  Pt is also taking folic acid 800 mg daily and she and her husband are considering starting a family.  Pt seeing fertility specialist, endorses recent labs including TSH.  Also notes sinus issues.  takin  Pt notes increased stress as her husband had to have emergency heart surgery.  She states he had a murmur, but echo revealed leaky/enlarged valve.  Patient taking Zoloft 75 mg daily.  States her [facility] provider wrote a prescription for Abilify but she has not taken it.  Pt notes feeling more emotional recently.  Pt also mentions she has a GI appointment on 3/19.  States in the past had a history of small intestine bacterial overgrowth (SIBO).  Pt notes increased flatus which required antibiotics.  Pt was supposed to be on low FODMAP diet.  Past Medical History:  Diagnosis Date  . Anxiety   . Depression   . Diabetes mellitus 05/2008   DR. BALAN  . GERD (gastroesophageal reflux disease)    after taking antibiotics it went away  . Hypertension   . Obesity     Allergies  Allergen Reactions  . Metformin And Related Itching    ROS General: Denies fever, chills, night sweats, changes in weight, changes in appetite  +fatigue HEENT: Denies headaches, ear pain, rhinorrhea, sore throat    +blurred vision CV: Denies CP, palpitations, SOB, orthopnea Pulm: Denies SOB, cough, wheezing GI: Denies abdominal pain, nausea, vomiting, diarrhea, constipation  +increased flatus GU: Denies dysuria, hematuria, frequency, vaginal  discharge Msk: Denies muscle cramps, joint pains Neuro: Denies weakness, numbness, tingling Skin: Denies rashes, bruising Psych: Denies anxiety, hallucinations  +depression  Objective:    Blood pressure 98/78, pulse 88, temperature 98.5 F (36.9 C), temperature source Oral, weight 225 lb (102.1 kg), SpO2 98 %.  Gen. Pleasant, well-nourished, in no distress, normal affect   HEENT: [place]/AT, face symmetric,  no scleral icterus, PERRLA, nares patent without drainage Lungs: no accessory muscle use, CTAB, no wheezes or rales Cardiovascular: RRR, no m/r/g, no peripheral edema Abdomen: BS present, soft, NT/ND Neuro:  A&Ox3, CN II-XII intact, normal gait Skin:  Warm, no lesions/ rash   Wt Readings from Last 3 Encounters:  03/31/18 225 lb (102.1 kg)  08/28/17 225 lb (102.1 kg)  07/17/17 222 lb (100.7 kg)    Lab Results  Component Value Date   WBC 8.3 03/31/2018   HGB 12.2 03/31/2018   HCT 37.5 03/31/2018   PLT 350.0 03/31/2018   GLUCOSE 104 06/29/2017   CHOL 153 02/15/2014   TRIG 61 02/15/2014   HDL 73 02/15/2014   LDLCALC 68 02/15/2014   ALT 42 06/29/2017   AST 30 06/29/2017   NA 136 06/29/2017   K 3.5 06/29/2017   CL 105 06/29/2017   CREATININE 0.83 06/29/2017   BUN 8 06/29/2017   CO2 24 06/29/2017   TSH 1.57 02/03/2017   HGBA1C 6.5 (H) 02/09/2017    Assessment/Plan:  Fatigue, unspecified  -given handout -  continue daily vitamins -TSH recently checked by specialist. - Plan: Vitamin B12, CBC (no diff), IBC + Ferritin  Vitamin D deficiency  -continue daily vitamins   - Plan: Vitamin D, 25-hydroxy  F/u prn  Abbe Amsterdam, MD

## 2018-03-31 NOTE — Patient Instructions (Signed)
Fatigue If you have fatigue, you feel tired all the time and have a lack of energy or a lack of motivation. Fatigue may make it difficult to start or complete tasks because of exhaustion. In general, occasional or mild fatigue is often a normal response to activity or life. However, long-lasting (chronic) or extreme fatigue may be a symptom of a medical condition. Follow these instructions at home: General instructions  Watch your fatigue for any changes.  Go to bed and get up at the same time every day.  Avoid fatigue by pacing yourself during the day and getting enough sleep at night.  Maintain a healthy weight. Medicines  Take over-the-counter and prescription medicines only as told by your health care provider.  Take a multivitamin, if told by your health care provider.  Do not use herbal or dietary supplements unless they are approved by your health care provider. Activity   Exercise regularly, as told by your health care provider.  Use or practice techniques to help you relax, such as yoga, tai chi, meditation, or massage therapy. Eating and drinking   Avoid heavy meals in the evening.  Eat a well-balanced diet, which includes lean proteins, whole grains, plenty of fruits and vegetables, and low-fat dairy products.  Avoid consuming too much caffeine.  Avoid the use of alcohol.  Drink enough fluid to keep your urine pale yellow. Lifestyle  Change situations that cause you stress. Try to keep your work and personal schedule in balance.  Do not use any products that contain nicotine or tobacco, such as cigarettes and e-cigarettes. If you need help quitting, ask your health care provider.  Do not use drugs. Contact a health care provider if:  Your fatigue does not get better.  You have a fever.  You suddenly lose or gain weight.  You have headaches.  You have trouble falling asleep or sleeping through the night.  You feel angry, guilty, anxious, or sad.   You are unable to have a bowel movement (constipation).  Your skin is dry.  You have swelling in your legs or another part of your body. Get help right away if:  You feel confused.  Your vision is blurry.  You feel faint or you pass out.  You have a severe headache.  You have severe pain in your abdomen, your back, or the area between your waist and hips (pelvis).  You have chest pain, shortness of breath, or an irregular or fast heartbeat.  You are unable to urinate, or you urinate less than normal.  You have abnormal bleeding, such as bleeding from the rectum, vagina, nose, lungs, or nipples.  You vomit blood.  You have thoughts about hurting yourself or others. If you ever feel like you may hurt yourself or others, or have thoughts about taking your own life, get help right away. You can go to your nearest emergency department or call:  Your local emergency services (911 in the [place]  A suicide crisis helpline, such as the [place] at (505)831-7817. This is open 24 hours a day. Summary  If you have fatigue, you feel tired all the time and have a lack of energy or a lack of motivation.  Fatigue may make it difficult to start or complete tasks because of exhaustion.  Long-lasting (chronic) or extreme fatigue may be a symptom of a medical condition.  Exercise regularly, as told by your health care provider.  Change situations that cause you stress. Try to keep your  work and personal schedule in balance. This information is not intended to replace advice given to you by your health care provider. Make sure you discuss any questions you have with your health care provider. Document Released: 11/03/2006 Document Revised: 10/01/2016 Document Reviewed: 10/01/2016 Elsevier Interactive Patient Education  2019 [place]  FODMAPs (fermentable oligosaccharides, disaccharides, monosaccharides, and polyols) are sugars that  are hard for some people to digest. A low-FODMAP eating plan may help some people who have bowel (intestinal) diseases to manage their symptoms. This meal plan can be complicated to follow. Work with a diet and nutrition specialist (dietitian) to make a low-FODMAP eating plan that is right for you. A dietitian can make sure that you get enough nutrition from this diet. What are tips for following this plan? Reading food labels  Check labels for hidden FODMAPs such as: ? High-fructose syrup. ? Honey. ? Agave. ? Natural fruit flavors. ? Onion or garlic powder.  Choose low-FODMAP foods that contain 3-4 grams of fiber per serving.  Check food labels for serving sizes. Eat only one serving at a time to make sure FODMAP levels stay low. Meal planning  Follow a low-FODMAP eating plan for up to 6 weeks, or as told by your health care provider or dietitian.  To follow the eating plan: 1. Eliminate high-FODMAP foods from your diet completely. 2. Gradually reintroduce high-FODMAP foods into your diet one at a time. Most people should wait a few days after introducing one high-FODMAP food before they introduce the next high-FODMAP food. Your dietitian can recommend how quickly you may reintroduce foods. 3. Keep a daily record of what you eat and drink, and make note of any symptoms that you have after eating. 4. Review your daily record with a dietitian regularly. Your dietitian can help you identify which foods you can eat and which foods you should avoid. General tips  Drink enough fluid each day to keep your urine pale yellow.  Avoid processed foods. These often have added sugar and may be high in FODMAPs.  Avoid most dairy products, whole grains, and sweeteners.  Work with a dietitian to make sure you get enough fiber in your diet. Recommended foods Grains  Gluten-free grains, such as rice, oats, buckwheat, quinoa, corn, polenta, and millet. Gluten-free pasta, bread, or cereal. Rice  noodles. Corn tortillas. Vegetables  Eggplant, zucchini, cucumber, peppers, green beans, Brussels sprouts, bean sprouts, lettuce, arugula, kale, Swiss chard, spinach, collard greens, bok choy, summer squash, potato, and tomato. Limited amounts of corn, carrot, and sweet potato. Green parts of scallions. Fruits  Bananas, oranges, lemons, limes, blueberries, raspberries, strawberries, grapes, cantaloupe, honeydew melon, kiwi, papaya, passion fruit, and pineapple. Limited amounts of dried cranberries, banana chips, and shredded coconut. Dairy  Lactose-free milk, yogurt, and kefir. Lactose-free cottage cheese and ice cream. Non-dairy milks, such as almond, coconut, hemp, and rice milk. Yogurts made of non-dairy milks. Limited amounts of goat cheese, brie, mozzarella, parmesan, swiss, and other hard cheeses. Meats and other protein foods  Unseasoned beef, pork, poultry, or fish. Eggs. Berniece Salines. Tofu (firm) and tempeh. Limited amounts of nuts and seeds, such as almonds, walnuts, [place] nuts, pecans, peanuts, pumpkin seeds, chia seeds, and sunflower seeds. Fats and oils  Butter-free spreads. Vegetable oils, such as olive, canola, and sunflower oil. Seasoning and other foods  Artificial sweeteners with names that do not end in "ol" such as aspartame, saccharine, and stevia. Maple syrup, white table sugar, raw sugar, brown sugar, and molasses. Fresh  basil, coriander, parsley, rosemary, and thyme. Beverages  Water and mineral water. Sugar-sweetened soft drinks. Small amounts of orange juice or cranberry juice. Black and green tea. Most dry wines. Coffee. This may not be a complete list of low-FODMAP foods. Talk with your dietitian for more information. Foods to avoid Grains  Wheat, including kamut, durum, and semolina. Barley and bulgur. Couscous. Wheat-based cereals. Wheat noodles, bread, crackers, and pastries. Vegetables  Chicory root, artichoke, asparagus, cabbage, snow peas, sugar snap peas,  mushrooms, and cauliflower. Onions, garlic, leeks, and the white part of scallions. Fruits  Fresh, dried, and juiced forms of apple, pear, watermelon, peach, plum, cherries, apricots, blackberries, boysenberries, figs, nectarines, and mango. Avocado. Dairy  Milk, yogurt, ice cream, and soft cheese. Cream and sour cream. Milk-based sauces. Custard. Meats and other protein foods  Fried or fatty meat. Sausage. Cashews and pistachios. Soybeans, baked beans, black beans, chickpeas, kidney beans, fava beans, navy beans, lentils, and split peas. Seasoning and other foods  Any sugar-free gum or candy. Foods that contain artificial sweeteners such as sorbitol, mannitol, isomalt, or xylitol. Foods that contain honey, high-fructose corn syrup, or agave. Bouillon, vegetable stock, beef stock, and chicken stock. Garlic and onion powder. Condiments made with onion, such as hummus, chutney, pickles, relish, salad dressing, and salsa. Tomato paste. Beverages  Chicory-based drinks. Coffee substitutes. Chamomile tea. Fennel tea. Sweet or fortified wines such as port or sherry. Diet soft drinks made with isomalt, mannitol, maltitol, sorbitol, or xylitol. Apple, pear, and mango juice. Juices with high-fructose corn syrup. This may not be a complete list of high-FODMAP foods. Talk with your dietitian to discuss what dietary choices are best for you.  Summary  A low-FODMAP eating plan is a short-term diet that eliminates FODMAPs from your diet to help ease symptoms of certain bowel diseases.  The eating plan usually lasts up to 6 weeks. After that, high-FODMAP foods are restarted gradually, one at a time, so you can find out which may be causing symptoms.  A low-FODMAP eating plan can be complicated. It is best to work with a dietitian who has experience with this type of plan. This information is not intended to replace advice given to you by your health care provider. Make sure you discuss any questions you have  with your health care provider. Document Released: 09/02/2016 Document Revised: 09/02/2016 Document Reviewed: 09/02/2016 Elsevier Interactive Patient Education  [employer].

## 2018-04-07 ENCOUNTER — Telehealth: Payer: Self-pay

## 2018-04-07 NOTE — Telephone Encounter (Signed)
Covid-19 travel screening questions  Have you traveled in the last 14 days? No If yes where?  Do you now or have you had a fever in the last 14 days? No  Do you have any respiratory symptoms of shortness of breath or cough now or in the last 14 days? No  Do you have a medical history of Congestive Heart Failure? N/a  Do you have a medical history of lung disease? N/a  Do you have any family members or close contacts with diagnosed or suspected Covid-19? No  Pt plans to keep appt as scheduled.        

## 2018-04-08 ENCOUNTER — Other Ambulatory Visit: Payer: Self-pay

## 2018-04-08 ENCOUNTER — Ambulatory Visit: Payer: BC Managed Care – PPO | Admitting: Gastroenterology

## 2018-04-08 ENCOUNTER — Encounter: Payer: Self-pay | Admitting: Gastroenterology

## 2018-04-08 VITALS — BP 114/60 | HR 84 | Temp 98.2°F | Ht 69.0 in | Wt 227.0 lb

## 2018-04-08 DIAGNOSIS — D509 Iron deficiency anemia, unspecified: Secondary | ICD-10-CM

## 2018-04-08 DIAGNOSIS — K529 Noninfective gastroenteritis and colitis, unspecified: Secondary | ICD-10-CM | POA: Diagnosis not present

## 2018-04-08 DIAGNOSIS — R140 Abdominal distension (gaseous): Secondary | ICD-10-CM

## 2018-04-08 MED ORDER — HYOSCYAMINE SULFATE 0.125 MG SL SUBL: 0.1250 mg | SUBLINGUAL_TABLET | Freq: Four times a day (QID) | SUBLINGUAL | 2 refills | Status: DC | PRN

## 2018-04-08 NOTE — Progress Notes (Signed)
[name] Progress Note  Chief Complaint: Iron deficiency anemia  Subjective  History: Most recently seen March 2019 for chronic iron deficiency anemia along with bloating gas and intermittent diarrhea.  She has received IV iron infusions with significant improvement in her iron levels, hemoglobin and fatigue.  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 According to recent primary care note, [name] [name] has been under increased stress recently because her husband unexpectedly required heart valve surgery.  [name] [name] has had bloating and gas with some urgency for BMs.  The diarrhea is not as bad as it was before, and is often formed.  She has some urgency and then might not have a BM.  She feels there is a lot of gas with abdominal rumbling and noise.  She has continued taking iron tablets daily.  ROS: Cardiovascular:  no chest pain Respiratory: no dyspnea She feels her depression and anxiety are under good control.  She has had some stressors lately but not impairing her functional status.  Remainder of systems negative except as above  The patient's Past Medical, Family and Social History were reviewed and are on file in the EMR.  Objective:  Med list reviewed  Current Outpatient Medications:  .  cetirizine (ZYRTEC) 10 MG tablet, Take 1 tablet (10 mg total) by mouth daily., Disp: 90 tablet, Rfl: 3 .  Cyanocobalamin (B-12) 1000 MCG SUBL, Place 2,000 mcg under the tongue daily., Disp: 30 each, Rfl: 3 .  FERREX 150 150 MG capsule, TAKE 1 CAPSULE BY MOUTH EVERY DAY, Disp: 90 capsule, Rfl: 1 .  folic acid (FOLVITE) 800 MCG tablet, Take 800 mcg by mouth daily., Disp: , Rfl:  .  sertraline (ZOLOFT) 50 MG tablet, Take 50 mg by mouth daily., Disp: , Rfl:  .  Vitamin D, Ergocalciferol, (DRISDOL) 50000 units CAPS capsule, TAKE ONE CAPSULE BY MOUTH TWICE WEEKLY, Disp: 24 capsule, Rfl: 2 .  hyoscyamine (LEVSIN SL) 0.125 MG SL  tablet, Place 1 tablet (0.125 mg total) under the tongue every 6 (six) hours as needed., Disp: 30 tablet, Rfl: 2   Vital signs in last 24 hrs: Vitals:   04/08/18 0954  BP: 114/60  Pulse: 84  Temp: 98.2 F (36.8 C)    Physical Exam  Well-appearing  HEENT: sclera anicteric, oral mucosa moist without lesions  Neck: supple, no thyromegaly, JVD or lymphadenopathy  Cardiac: RRR without murmurs, S1S2 heard, no peripheral edema  Pulm: clear to auscultation bilaterally, normal RR and effort noted  Abdomen: soft, no tenderness, with active bowel sounds. No guarding or palpable hepatosplenomegaly.  Skin; warm and dry, no jaundice or rash  Recent Labs:  CBC Latest Ref Rng & Units 03/31/2018 06/29/2017 06/02/2017  WBC 4.0 - 10.5 K/uL 8.3 9.4 5.5  Hemoglobin 12.0 - 15.0 g/dL 16.1 09.6 04.5  Hematocrit 36.0 - 46.0 % 37.5 41.8 37.8  Platelets 150.0 - 400.0 K/uL 350.0 264 287.0   On 03/31/2018: Iron level 68, ferritin 7.5 B12 normal at 628 No recent folic acid level  @ASSESSMENTPLANBEGIN @ Assessment: Encounter Diagnoses  Name Primary?  . Iron deficiency anemia, unspecified iron deficiency anemia type Yes  . Abdominal bloating   . Chronic diarrhea    Though she previously responded to empiric SIBO therapy, and has recurrence of some symptoms, I wonder if she may be having more IBS at this point.  Regarding the iron deficiency with normal hemoglobin, she would be better off with IV iron than iron  tablets.  I suspect she absorbs little of the oral iron because she has had a gastric bypass.  However, given that her hemoglobin is normal and there would be infectious risk to a hospital-based outpatient infusion due to COVID-19 and current restrictions in place, she will stay on oral iron and we will recheck labs in about 2 months.  By then, we may have other outpatient IV infusion options available.   Plan: Check hemoglobin and ferritin in 2 months.  Clinical reminder set. Trial of Levsin  0.125 mg every 6 hours as needed for cramps, urgency and bloating.   Total time 25 minutes, over half spent face-to-face with patient in counseling and coordination of care.   Charlie Pitter III

## 2018-04-08 NOTE — Patient Instructions (Signed)
If you are age 39 or older, your body mass index should be between 23-30. Your Body mass index is 33.52 kg/m. If this is out of the aforementioned range listed, please consider follow up with your Primary Care Provider.  If you are age 53 or younger, your body mass index should be between 19-25. Your Body mass index is 33.52 kg/m. If this is out of the aformentioned range listed, please consider follow up with your Primary Care Provider.   To help prevent the possible spread of infection to our patients, communities, and staff; we will be implementing the following measures:  Please only allow one visitor/family member to accompany you to any upcoming appointments with  [facility]. If you have any concerns about this please contact our office to discuss prior to the appointment.   You will be due for recall lab work in 2 months (05-2018). We will send you a reminder when it gets closer to that time.  It was a pleasure to see you today!  Dr. Myrtie Neither

## 2018-05-27 ENCOUNTER — Other Ambulatory Visit: Payer: Self-pay

## 2018-05-27 DIAGNOSIS — D509 Iron deficiency anemia, unspecified: Secondary | ICD-10-CM

## 2018-05-27 DIAGNOSIS — D508 Other iron deficiency anemias: Secondary | ICD-10-CM

## 2018-06-01 ENCOUNTER — Other Ambulatory Visit (INDEPENDENT_AMBULATORY_CARE_PROVIDER_SITE_OTHER): Payer: BC Managed Care – PPO

## 2018-06-01 DIAGNOSIS — D509 Iron deficiency anemia, unspecified: Secondary | ICD-10-CM | POA: Diagnosis not present

## 2018-06-01 DIAGNOSIS — D508 Other iron deficiency anemias: Secondary | ICD-10-CM

## 2018-06-01 LAB — HEMOGLOBIN: Hemoglobin: 10.9 g/dL — ABNORMAL LOW (ref 12.0–15.0)

## 2018-06-01 LAB — FERRITIN: Ferritin: 6.4 ng/mL — ABNORMAL LOW (ref 10.0–291.0)

## 2018-06-16 ENCOUNTER — Telehealth: Payer: Self-pay | Admitting: *Deleted

## 2018-06-16 NOTE — Telephone Encounter (Signed)
Contacted patient regarding test results per Dr. Clyda Greener directions (following message from Dr. Myrtie Neither w/ [facility]):  "Please schedule her for labs/clinic visit and IV Iron (Injectafer) the week after next." Patient states she wants to have IV iron infusion at [facility]. Informed her that labs and appt wi/Dr. Candise Che will happen here at [facility]. Once those appts are set up, appt can be made at [facility]. Patient verbalized understanding of all information. Message sent to scheduling for appts at [facility].

## 2018-06-18 ENCOUNTER — Telehealth: Payer: Self-pay | Admitting: Hematology

## 2018-06-18 NOTE — Telephone Encounter (Signed)
Spoke with patient re 6/8 lab/fu. °

## 2018-06-25 NOTE — Progress Notes (Signed)
HEMATOLOGY/ONCOLOGY CONSULTATION NOTE  Date of Service: 06/28/2018  Patient Care Team: Deeann Saint, MD as PCP - General (Family Medicine) Himmelrich, Loree Fee, RD (Inactive) as Dietitian (Bariatrics)  CHIEF COMPLAINTS/PURPOSE OF CONSULTATION:  Iron Deficiency Anemia  HISTORY OF PRESENTING ILLNESS:   [name] [name] is a wonderful 38 y.o. female who has been referred to [place] by Dr Knox Royalty for evaluation and management of Iron Deficiency Anemia. The pt reports that she is doing well overall.   The pt reports being told that she had low iron last year from her OBGYN and had not been having periods at the time due to taking oral contraceptives. She was placed on PO iron and notes that she tolerated this well but was not diligent about taking it regularly. She notes that she stopped her birth control and is trying to become pregnant which will also increase her iron needs.   She had her gastric bypass in 2014 and did not have iron deficiency. She denies taking vitamins in the aftermath of her surgery. She has followed back up with Dr Myrtie Neither in GI recently for her iron deficiency who was concerned for absorption issues. She denies taking Vitamin B12 regularly. She had IV Iron within the last few weeks but her last ferritin labs on 06/02/17 showed she is still iron deficient. She notes that she tolerated IV Feraheme well and had no concerns for an allergic reaction.   She denies having Vitamin B12 or Vitamin D deficiencies.   Most recent lab results (06/02/17) of CBC  is as follows: all values are WNL. Ferritin 06/02/17 is low at 7.5  On review of systems, pt reports fatigue, regular bowel movements, and denies blood in the stools, black stools, unexpected weight loss, vision problems, abdominal pains, leg swelling, and any other symptoms.   On PMHx the pt reports gastric bypass in 2014.  Interval History:   [name] Monica Codd returns today for management and evaluation of her  Iron deficiency anemia. The patient's last visit with [place] was on 06/29/17. The pt reports that she is doing well overall.  The pt reports that she never received her IV Injectafer last June 2019, and chose not to reschedule this. She would like to reschedule this now and receive this at the [facility]. She last had IV Iron replacement two years ago. She notes that she has been craving bananas in the interim, which she has craved during times in which she was previously iron deficient.  The pt notes that her periods have been regular, last 4 days, and first 3 days are steady. She denies any concerns for GI blood loss and has not had an endoscopy nor colonoscopy recently.   She notes that she has had recent fatigue and attributes this to her low iron levels. She takes Ferrex one tablet per day for PO iron replacement. She is also taking Vitamin B12 and Vitamin D replacement twice weekly.  Lab results today (06/28/18) of CBC w/diff, Reticulocytes is as follows: all values are WNL except for HGB at 10.7, HCT at 35.9, MCH at 24.4, MCHC at 29.8, PLT at 410k, Immature Retic fract at 23.0%. 06/28/18 Iron and TIBC revealed Iron at 25, TIBC at 466, a 5% Saturation ratio, and UIBC at 442 06/28/18 Ferritin is . Lab Results  Component Value Date   IRON 25 (L) 06/28/2018   TIBC 466 (H) 06/28/2018   IRONPCTSAT 5 (L) 06/28/2018   (Iron and TIBC)  Lab  Results  Component Value Date   FERRITIN <4 (L) 06/28/2018    06/28/18 Vitamin B12 - 242 and Vitamin D 34.1  On review of systems, pt reports regular and normal periods, low energy levels, and denies concerns for infections, and any other symptoms.   MEDICAL HISTORY:  Past Medical History:  Diagnosis Date  . Anxiety   . Depression   . Diabetes mellitus 05/2008   DR. BALAN  . GERD (gastroesophageal reflux disease)    after taking antibiotics it went away  . Hypertension   . Obesity     SURGICAL HISTORY: Past Surgical History:  Procedure  Laterality Date  . BREATH TEK H PYLORI N/A 08/20/2012   Procedure: BREATH TEK H PYLORI;  Surgeon: Lodema Pilot, DO;  Location: [facility];  Service: Endoscopy;  Laterality: N/A;  . GASTRIC ROUX-EN-Y N/A 08/24/2012   Procedure: LAPAROSCOPIC ROUX-EN-Y GASTRIC BYPASS WITH UPPER ENDOSCOPY;  Surgeon: Lodema Pilot, DO;  Location: [facility];  Service: General;  Laterality: N/A;    SOCIAL HISTORY: Social History   Socioeconomic History  . Marital status: Married    Spouse name: Not on file  . Number of children: 0  . Years of education: Not on file  . Highest education level: Not on file  Occupational History  . Occupation: Programmer, systems  Social Needs  . Financial resource strain: Not on file  . Food insecurity:    Worry: Not on file    Inability: Not on file  . Transportation needs:    Medical: Not on file    Non-medical: Not on file  Tobacco Use  . Smoking status: Never Smoker  . Smokeless tobacco: Never Used  Substance and Sexual Activity  . Alcohol use: No    Alcohol/week: 0.0 standard drinks  . Drug use: No  . Sexual activity: Yes    Birth control/protection: Condom    Comment: INTERCOUSRE AGE 75, MORE THAN 5 SEXUAL PARTNERS  Lifestyle  . Physical activity:    Days per week: Not on file    Minutes per session: Not on file  . Stress: Not on file  Relationships  . Social connections:    Talks on phone: Not on file    Gets together: Not on file    Attends religious service: Not on file    Active member of club or organization: Not on file    Attends meetings of clubs or organizations: Not on file    Relationship status: Not on file  . Intimate partner violence:    Fear of current or ex partner: Not on file    Emotionally abused: Not on file    Physically abused: Not on file    Forced sexual activity: Not on file  Other Topics Concern  . Not on file  Social History Narrative  . Not on file    FAMILY HISTORY: Family History  Problem Relation Age of Onset  . Diabetes  Mother   . Hypertension Mother   . Miscarriages / [place]ali Mother   . Diabetes Maternal Aunt   . Diabetes Maternal Grandfather   . Dementia Maternal Grandmother   . Diabetes Maternal Grandmother   . Mental illness Brother     ALLERGIES:  is allergic to metformin and related.  MEDICATIONS:  Current Outpatient Medications  Medication Sig Dispense Refill  . cetirizine (ZYRTEC) 10 MG tablet Take 1 tablet (10 mg total) by mouth daily. 90 tablet 3  . Cyanocobalamin (B-12) 1000 MCG SUBL Place 2,000 mcg under the tongue  daily. 30 each 3  . FERREX 150 150 MG capsule TAKE 1 CAPSULE BY MOUTH EVERY DAY 90 capsule 1  . folic acid (FOLVITE) 800 MCG tablet Take 800 mcg by mouth daily.    . hyoscyamine (LEVSIN SL) 0.125 MG SL tablet Place 1 tablet (0.125 mg total) under the tongue every 6 (six) hours as needed. 30 tablet 2  . sertraline (ZOLOFT) 50 MG tablet Take 50 mg by mouth daily.    . Vitamin D, Ergocalciferol, (DRISDOL) 50000 units CAPS capsule TAKE ONE CAPSULE BY MOUTH TWICE WEEKLY 24 capsule 2   No current facility-administered medications for this visit.     REVIEW OF SYSTEMS:    A 10+ POINT REVIEW OF SYSTEMS WAS OBTAINED including neurology, dermatology, psychiatry, cardiac, respiratory, lymph, extremities, GI, GU, Musculoskeletal, constitutional, breasts, reproductive, HEENT.  All pertinent positives are noted in the HPI.  All others are negative.   PHYSICAL EXAMINATION:  . Vitals:   06/28/18 1051  BP: 108/85  Pulse: 83  Resp: 18  Temp: 98.2 F (36.8 C)  SpO2: 100%   Filed Weights   06/28/18 1051  Weight: 233 lb 9.6 oz (106 kg)   .Body mass index is 34.5 kg/m.  GENERAL:alert, in no acute distress and comfortable SKIN: no acute rashes, no significant lesions EYES: conjunctiva are pink and non-injected, sclera anicteric OROPHARYNX: MMM, no exudates, no oropharyngeal erythema or ulceration NECK: supple, no JVD LYMPH:  no palpable lymphadenopathy in the cervical,  axillary or inguinal regions LUNGS: clear to auscultation b/l with normal respiratory effort HEART: regular rate & rhythm ABDOMEN:  normoactive bowel sounds , non tender, not distended. No palpable hepatosplenomegaly.  Extremity: no pedal edema PSYCH: alert & oriented x 3 with fluent speech NEURO: no focal motor/sensory deficits   LABORATORY DATA:  I have reviewed the data as listed  . CBC Latest Ref Rng & Units 06/28/2018 06/01/2018 03/31/2018  WBC 4.0 - 10.5 K/uL 6.8 - 8.3  Hemoglobin 12.0 - 15.0 g/dL 10.7(L) 10.9(L) 12.2  Hematocrit 36.0 - 46.0 % 35.9(L) - 37.5  Platelets 150 - 400 K/uL 410(H) - 350.0    . CMP Latest Ref Rng & Units 06/29/2017 07/16/2016 12/07/2012  Glucose 70 - 140 mg/dL 782 75 956(O)  BUN 7 - 26 mg/dL Creatinine 0.60 - 1.10 mg/dL 1.30 8.65 7.84  Sodium 136 - 145 mmol/L 136 138 140  Potassium 3.5 - 5.1 mmol/L 3.5 3.9 3.4(L)  Chloride 98 - 109 mmol/L 105 108 105  CO2 22 - 29 mmol/L Calcium 8.4 - 10.4 mg/dL 9.0 6.9(G) 8.7  Total Protein 6.4 - 8.3 g/dL 8.1 7.2 -  Total Bilirubin 0.2 - 1.2 mg/dL 0.6 2.9(B) -  Alkaline Phos 40 - 150 U/L 172(H) 80 -  AST 5 - 34 U/L 30 22 -  ALT 0 - 55 U/L 42 14 -   . Lab Results  Component Value Date   IRON 25 (L) 06/28/2018   TIBC 466 (H) 06/28/2018   IRONPCTSAT 5 (L) 06/28/2018   (Iron and TIBC)  Lab Results  Component Value Date   FERRITIN <4 (L) 06/28/2018   B12 -242   RADIOGRAPHIC STUDIES: I have personally reviewed the radiological images as listed and agreed with the findings in the report. No results found.  ASSESSMENT & PLAN:   38 y.o. female with   1. Iron Deficiency Labs upon initial presentation from 06/02/17, Ferritin low at 7.5 Unclear etiology though likely related to iron  absorptions issues related to gastric bypass surgery. No dietary restrictions No overt gi bleeding.  2. Anemia due to Iron deficiency   3. B12 deficiency B12 level 155 Rechecked   4. Severe Vit D  deficiency -- 25)H vit levels < 4 -Ergocalciferol 50k units twice weekly  PLAN -Discussed pt labwork today, 06/28/18; HGB stable at 10.7, PLT at 410k -6/820 Ferritin is < 4 -Continue PO 150mg  Iron Polysaccharide replacement for maintenance -Will set pt up for IV Feraheme x 2 doses, informed consent obtained -Reasonable to take Tylenol and Zyrtec just prior to infusion. She takes Zyrtec daily. 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 -continue B12 2000 mcg daily -Will defer to GI and PCP regarding need for additional GI workup. -Will see the pt back in 3 months   IV feraheme weekly x 2 doses ASAP ([facility] RTC with Dr Candise Che with labs in 3 months   All of the patients questions were answered with apparent satisfaction. The patient knows to call the clinic with any problems, questions or concerns.  The total time spent in the appt was 25 minutes and more than 50% was on counseling and direct patient cares.    Wyvonnia Lora  MD MS AAHIVMS [facility] Hematology/Oncology Physician [facility]  (Office):       [phone] (Work cell):  [phone] (Fax):           [phone]  06/28/2018 11:51 AM  I, Marcelline MatesSchuyler Bain, am acting as a scribe for Dr. Wyvonnia Lora .   .I have reviewed the above documentation for accuracy and completeness, and I agree with the above. Johney Maine. Kishore  MD

## 2018-06-28 ENCOUNTER — Inpatient Hospital Stay: Payer: BC Managed Care – PPO | Attending: Hematology | Admitting: Hematology

## 2018-06-28 ENCOUNTER — Telehealth: Payer: Self-pay | Admitting: Hematology

## 2018-06-28 ENCOUNTER — Inpatient Hospital Stay: Payer: BC Managed Care – PPO

## 2018-06-28 ENCOUNTER — Other Ambulatory Visit: Payer: Self-pay

## 2018-06-28 VITALS — BP 108/85 | HR 83 | Temp 98.2°F | Resp 18 | Ht 69.0 in | Wt 233.6 lb

## 2018-06-28 DIAGNOSIS — Z833 Family history of diabetes mellitus: Secondary | ICD-10-CM | POA: Diagnosis not present

## 2018-06-28 DIAGNOSIS — E559 Vitamin D deficiency, unspecified: Secondary | ICD-10-CM

## 2018-06-28 DIAGNOSIS — Z820 Family history of epilepsy and other diseases of the nervous system: Secondary | ICD-10-CM | POA: Diagnosis not present

## 2018-06-28 DIAGNOSIS — D509 Iron deficiency anemia, unspecified: Secondary | ICD-10-CM | POA: Insufficient documentation

## 2018-06-28 DIAGNOSIS — R5383 Other fatigue: Secondary | ICD-10-CM | POA: Diagnosis not present

## 2018-06-28 DIAGNOSIS — Z9884 Bariatric surgery status: Secondary | ICD-10-CM | POA: Insufficient documentation

## 2018-06-28 DIAGNOSIS — E119 Type 2 diabetes mellitus without complications: Secondary | ICD-10-CM | POA: Diagnosis not present

## 2018-06-28 DIAGNOSIS — Z8249 Family history of ischemic heart disease and other diseases of the circulatory system: Secondary | ICD-10-CM

## 2018-06-28 DIAGNOSIS — E538 Deficiency of other specified B group vitamins: Secondary | ICD-10-CM | POA: Diagnosis not present

## 2018-06-28 DIAGNOSIS — Z79899 Other long term (current) drug therapy: Secondary | ICD-10-CM | POA: Insufficient documentation

## 2018-06-28 LAB — RETICULOCYTES
Immature Retic Fract: 23 % — ABNORMAL HIGH (ref 2.3–15.9)
RBC.: 4.43 MIL/uL (ref 3.87–5.11)
Retic Count, Absolute: 54.5 10*3/uL (ref 19.0–186.0)
Retic Ct Pct: 1.2 % (ref 0.4–3.1)

## 2018-06-28 LAB — IRON AND TIBC
Iron: 25 ug/dL — ABNORMAL LOW (ref 41–142)
Saturation Ratios: 5 % — ABNORMAL LOW (ref 21–57)
TIBC: 466 ug/dL — ABNORMAL HIGH (ref 236–444)
UIBC: 442 ug/dL — ABNORMAL HIGH (ref 120–384)

## 2018-06-28 LAB — CBC WITH DIFFERENTIAL/PLATELET
Abs Immature Granulocytes: 0.03 10*3/uL (ref 0.00–0.07)
Basophils Absolute: 0 10*3/uL (ref 0.0–0.1)
Basophils Relative: 0 %
Eosinophils Absolute: 0.2 10*3/uL (ref 0.0–0.5)
Eosinophils Relative: 2 %
HCT: 35.9 % — ABNORMAL LOW (ref 36.0–46.0)
Hemoglobin: 10.7 g/dL — ABNORMAL LOW (ref 12.0–15.0)
Immature Granulocytes: 0 %
Lymphocytes Relative: 30 %
Lymphs Abs: 2 10*3/uL (ref 0.7–4.0)
MCH: 24.4 pg — ABNORMAL LOW (ref 26.0–34.0)
MCHC: 29.8 g/dL — ABNORMAL LOW (ref 30.0–36.0)
MCV: 81.8 fL (ref 80.0–100.0)
Monocytes Absolute: 0.7 10*3/uL (ref 0.1–1.0)
Monocytes Relative: 11 %
Neutro Abs: 3.8 10*3/uL (ref 1.7–7.7)
Neutrophils Relative %: 57 %
Platelets: 410 10*3/uL — ABNORMAL HIGH (ref 150–400)
RBC: 4.39 MIL/uL (ref 3.87–5.11)
RDW: 13.9 % (ref 11.5–15.5)
WBC: 6.8 10*3/uL (ref 4.0–10.5)
nRBC: 0 % (ref 0.0–0.2)

## 2018-06-28 LAB — FERRITIN: Ferritin: <4 ng/mL — ABNORMAL LOW (ref 11–307)

## 2018-06-28 LAB — VITAMIN B12: Vitamin B-12: 242 pg/mL (ref 180–914)

## 2018-06-28 NOTE — Telephone Encounter (Signed)
Scheduled appt per 6/8 los.  Spoke with patient and patient aware of appt date and time.  Patient will contact [place] to schedule her IV iron.

## 2018-06-29 LAB — VITAMIN D 25 HYDROXY (VIT D DEFICIENCY, FRACTURES): Vit D, 25-Hydroxy: 34.1 ng/mL (ref 30.0–100.0)

## 2018-07-08 ENCOUNTER — Other Ambulatory Visit: Payer: Self-pay

## 2018-07-08 ENCOUNTER — Ambulatory Visit (HOSPITAL_COMMUNITY)
Admission: RE | Admit: 2018-07-08 | Discharge: 2018-07-08 | Disposition: A | Discharge status: Home / Self Care | Payer: BC Managed Care – PPO | Source: Ambulatory Visit | Attending: Hematology | Admitting: Hematology

## 2018-07-08 DIAGNOSIS — D509 Iron deficiency anemia, unspecified: Secondary | ICD-10-CM | POA: Insufficient documentation

## 2018-07-08 MED ORDER — SODIUM CHLORIDE 0.9 % IV SOLN
INTRAVENOUS | Status: DC | PRN
  Administered 2018-07-08: 250 mL via INTRAVENOUS

## 2018-07-08 MED ORDER — SODIUM CHLORIDE 0.9 % IV SOLN
510.0000 mg | INTRAVENOUS | Status: DC
  Administered 2018-07-08: 510 mg via INTRAVENOUS
  Filled 2018-07-08: qty 17

## 2018-07-08 NOTE — Progress Notes (Signed)
Patient received Feraheme via PIV. Observed for at least 30 minutes post infusion.Tolerated well, vitals stable, discharge instructions given, verbalized understanding. Patient alert, oriented and ambulatory at the time of discharge.  

## 2018-07-08 NOTE — Discharge Instructions (Signed)

## 2018-07-15 ENCOUNTER — Other Ambulatory Visit: Payer: Self-pay

## 2018-07-15 ENCOUNTER — Ambulatory Visit (HOSPITAL_COMMUNITY)
Admission: RE | Admit: 2018-07-15 | Discharge: 2018-07-15 | Disposition: A | Discharge status: Home / Self Care | Payer: BC Managed Care – PPO | Source: Ambulatory Visit | Attending: Hematology | Admitting: Hematology

## 2018-07-15 DIAGNOSIS — D509 Iron deficiency anemia, unspecified: Secondary | ICD-10-CM | POA: Diagnosis not present

## 2018-07-15 MED ORDER — SODIUM CHLORIDE 0.9 % IV SOLN
510.0000 mg | Freq: Once | INTRAVENOUS | Status: AC
  Administered 2018-07-15: 510 mg via INTRAVENOUS
  Filled 2018-07-15: qty 17

## 2018-07-15 MED ORDER — SODIUM CHLORIDE 0.9 % IV SOLN
INTRAVENOUS | Status: DC | PRN
  Administered 2018-07-15: 250 mL via INTRAVENOUS

## 2018-07-15 NOTE — Discharge Instructions (Signed)

## 2018-07-15 NOTE — Progress Notes (Signed)
PATIENT CARE CENTER NOTE  Diagnosis: Iron Deficiency Anemia    Provider: Dr. Irene Limbo   Procedure: IV Feraheme    Note: Patient received Feraheme infusion via PIV. Tolerated well with no adverse reaction. Observed patient for 30 minutes post-infusion. Vital signs stable. Discharge instructions given. Alert, oriented and ambulatory at discharge.

## 2018-07-28 ENCOUNTER — Other Ambulatory Visit: Payer: Self-pay | Admitting: Family Medicine

## 2018-09-26 NOTE — Progress Notes (Signed)
HEMATOLOGY/ONCOLOGY CONSULTATION NOTE  Date of Service: 09/28/2018  Patient Care Team: Billie Ruddy, MD as PCP - General (Family Medicine) Himmelrich, Bryson Ha, RD (Inactive) as Dietitian (Bariatrics)  CHIEF COMPLAINTS/PURPOSE OF CONSULTATION:  Iron Deficiency Anemia  HISTORY OF PRESENTING ILLNESS:   [name] [name] is a wonderful 38 y.o. female who has been referred to [place] by Dr Kristie Cowman for evaluation and management of Iron Deficiency Anemia. The pt reports that she is doing well overall.   The pt reports being told that she had low iron last year from her OBGYN and had not been having periods at the time due to taking oral contraceptives. She was placed on PO iron and notes that she tolerated this well but was not diligent about taking it regularly. She notes that she stopped her birth control and is trying to become pregnant which will also increase her iron needs.   She had her gastric bypass in 2014 and did not have iron deficiency. She denies taking vitamins in the aftermath of her surgery. She has followed back up with Dr Loletha Carrow in GI recently for her iron deficiency who was concerned for absorption issues. She denies taking Vitamin B12 regularly. She had IV Iron within the last few weeks but her last ferritin labs on 06/02/17 showed she is still iron deficient. She notes that she tolerated IV Feraheme well and had no concerns for an allergic reaction.   She denies having Vitamin B12 or Vitamin D deficiencies.   Most recent lab results (06/02/17) of CBC  is as follows: all values are WNL. Ferritin 06/02/17 is low at 7.5  On review of systems, pt reports fatigue, regular bowel movements, and denies blood in the stools, black stools, unexpected weight loss, vision problems, abdominal pains, leg swelling, and any other symptoms.   On PMHx the pt reports gastric bypass in 2014.  Interval History:   [name] [name] returns today for management and evaluation of her  Iron deficiency anemia. The patient's last visit with [place] was on 06/28/2018. The pt reports that she is doing well overall.  The pt reports that she feels well and that her fatigue levels have improved. She feels more fatigue around the time of her menstruation. She also reports that she has been getting enough sleep. Pt has not been taking her Vitamin B12 regularly. She was taking sublingual tablets and has not been taken it for the last 6 months. She has been consistent with taking her Ergocalciferol twice a week. Pt is also concerned about how difficult it has been for her to continue to lose weight since her bariatric surgery in 2005. She has not been following up with her bariatric team.   Lab results today (09/28/18) of CBC w/diff and CMP is as follows: all values are WNL except for RDW at 15.7.  09/28/2018 Vitamin B12 at 188 09/28/2018 Iron and TIBC shows Iron at 61, TIBC at 345, Sat Ratios at 18, UIBC at 284 09/28/2018 Ferritin at 49  On review of systems, pt reports improved fatigue and denies abdominal pain, leg swelling and any other symptoms.    Lab Results  Component Value Date   IRON 61 09/28/2018   TIBC 345 09/28/2018   IRONPCTSAT 18 (L) 09/28/2018   (Iron and TIBC)  Lab Results  Component Value Date   FERRITIN 49 09/28/2018    06/28/18 Vitamin B12 - 242 and Vitamin D 34.1  On review of systems, pt reports regular and normal  periods, low energy levels, and denies concerns for infections, and any other symptoms.   MEDICAL HISTORY:  Past Medical History:  Diagnosis Date  . Anxiety   . Depression   . Diabetes mellitus 05/2008   DR. BALAN  . GERD (gastroesophageal reflux disease)    after taking antibiotics it went away  . Hypertension   . Obesity     SURGICAL HISTORY: Past Surgical History:  Procedure Laterality Date  . BREATH TEK H PYLORI N/A 08/20/2012   Procedure: BREATH TEK H PYLORI;  Surgeon: Lodema Pilot, DO;  Location: [facility];  Service: Endoscopy;   Laterality: N/A;  . GASTRIC ROUX-EN-Y N/A 08/24/2012   Procedure: LAPAROSCOPIC ROUX-EN-Y GASTRIC BYPASS WITH UPPER ENDOSCOPY;  Surgeon: Lodema Pilot, DO;  Location: [facility];  Service: General;  Laterality: N/A;    SOCIAL HISTORY: Social History   Socioeconomic History  . Marital status: Married    Spouse name: Not on file  . Number of children: 0  . Years of education: Not on file  . Highest education level: Not on file  Occupational History  . Occupation: Programmer, systems  Social Needs  . Financial resource strain: Not on file  . Food insecurity    Worry: Not on file    Inability: Not on file  . Transportation needs    Medical: Not on file    Non-medical: Not on file  Tobacco Use  . Smoking status: Never Smoker  . Smokeless tobacco: Never Used  Substance and Sexual Activity  . Alcohol use: No    Alcohol/week: 0.0 standard drinks  . Drug use: No  . Sexual activity: Yes    Birth control/protection: Condom    Comment: INTERCOUSRE AGE 10, MORE THAN 5 SEXUAL PARTNERS  Lifestyle  . Physical activity    Days per week: Not on file    Minutes per session: Not on file  . Stress: Not on file  Relationships  . Social Musician on phone: Not on file    Gets together: Not on file    Attends religious service: Not on file    Active member of club or organization: Not on file    Attends meetings of clubs or organizations: Not on file    Relationship status: Not on file  . Intimate partner violence    Fear of current or ex partner: Not on file    Emotionally abused: Not on file    Physically abused: Not on file    Forced sexual activity: Not on file  Other Topics Concern  . Not on file  Social History Narrative  . Not on file    FAMILY HISTORY: Family History  Problem Relation Age of Onset  . Diabetes Mother   . Hypertension Mother   . Miscarriages / [place] Mother   . Diabetes Maternal Aunt   . Diabetes Maternal Grandfather   . Dementia Maternal Grandmother   .  Diabetes Maternal Grandmother   . Mental illness Brother     ALLERGIES:  is allergic to metformin and related.  MEDICATIONS:  Current Outpatient Medications  Medication Sig Dispense Refill  . cetirizine (ZYRTEC) 10 MG tablet Take 1 tablet (10 mg total) by mouth daily. 90 tablet 3  . Cyanocobalamin (B-12) 1000 MCG SUBL Place 2,000 mcg under the tongue daily. 30 each 3  . FERREX 150 150 MG capsule TAKE 1 CAPSULE BY MOUTH EVERY DAY 90 capsule 1  . folic acid (FOLVITE) 800 MCG tablet Take 800 mcg by  mouth daily.    . hyoscyamine (LEVSIN SL) 0.125 MG SL tablet Place 1 tablet (0.125 mg total) under the tongue every 6 (six) hours as needed. 30 tablet 2  . sertraline (ZOLOFT) 50 MG tablet Take 50 mg by mouth daily.    . Vitamin D, Ergocalciferol, (DRISDOL) 50000 units CAPS capsule TAKE ONE CAPSULE BY MOUTH TWICE WEEKLY 24 capsule 2   No current facility-administered medications for this visit.     REVIEW OF SYSTEMS:    A 10+ POINT REVIEW OF SYSTEMS WAS OBTAINED including neurology, dermatology, psychiatry, cardiac, respiratory, lymph, extremities, GI, GU, Musculoskeletal, constitutional, breasts, reproductive, HEENT.  All pertinent positives are noted in the HPI.  All others are negative.   PHYSICAL EXAMINATION:  . Vitals:   09/28/18 1043  BP: 128/83  Pulse: 97  Resp: 18  Temp: 98.7 F (37.1 C)  SpO2: 100%   Filed Weights   09/28/18 1043  Weight: 239 lb (108.4 kg)   .Body mass index is 35.29 kg/m.   GENERAL:alert, in no acute distress and comfortable SKIN: no acute rashes, no significant lesions EYES: conjunctiva are pink and non-injected, sclera anicteric OROPHARYNX: MMM, no exudates, no oropharyngeal erythema or ulceration NECK: supple, no JVD LYMPH:  no palpable lymphadenopathy in the cervical, axillary or inguinal regions LUNGS: clear to auscultation b/l with normal respiratory effort HEART: regular rate & rhythm ABDOMEN:  normoactive bowel sounds , non tender, not  distended. No palpable hepatosplenomegaly.  Extremity: no pedal edema PSYCH: alert & oriented x 3 with fluent speech NEURO: no focal motor/sensory deficits   LABORATORY DATA:  I have reviewed the data as listed  . CBC Latest Ref Rng & Units 09/28/2018 06/28/2018 06/01/2018  WBC 4.0 - 10.5 K/uL 7.2 6.8 -  Hemoglobin 12.0 - 15.0 g/dL 09.813.6 10.7(L) 10.9(L)  Hematocrit 36.0 - 46.0 % 43.0 35.9(L) -  Platelets 150 - 400 K/uL 288 410(H) -    . CMP Latest Ref Rng & Units 06/29/2017 07/16/2016 12/07/2012  Glucose 70 - 140 mg/dL 119104 75 147(W129(H)  BUN 7 - 26 mg/dL 8 9 7   Creatinine 0.60 - 1.10 mg/dL 2.950.83 6.210.78 3.080.64  Sodium 136 - 145 mmol/L 136 138 140  Potassium 3.5 - 5.1 mmol/L 3.5 3.9 3.4(L)  Chloride 98 - 109 mmol/L 105 108 105  CO2 22 - 29 mmol/L 24 20 25   Calcium 8.4 - 10.4 mg/dL 9.0 6.5(H8.3(L) 8.7  Total Protein 6.4 - 8.3 g/dL 8.1 7.2 -  Total Bilirubin 0.2 - 1.2 mg/dL 0.6 8.4(O0.1(L) -  Alkaline Phos 40 - 150 U/L 172(H) 80 -  AST 5 - 34 U/L 30 22 -  ALT 0 - 55 U/L 42 14 -   . Lab Results  Component Value Date   IRON 61 09/28/2018   TIBC 345 09/28/2018   IRONPCTSAT 18 (L) 09/28/2018   (Iron and TIBC)  Lab Results  Component Value Date   FERRITIN 49 09/28/2018   B12 -242---> 188   RADIOGRAPHIC STUDIES: I have personally reviewed the radiological images as listed and agreed with the findings in the report. No results found.  ASSESSMENT & PLAN:   38 y.o. female with   1. Iron Deficiency Labs upon initial presentation from 06/02/17, Ferritin low at 7.5 Unclear etiology though likely related to iron absorptions issues related to gastric bypass surgery. No dietary restrictions No overt gi bleeding.  2. Anemia due to Iron deficiency   3. B12 deficiency B12 level 188 Rechecked   4. Severe Vit D  deficiency -- 25OH vit levels < 4 -Ergocalciferol 50k units twice weekly  PLAN  A&P: -Discussed pt labwork today, 09/28/18; all values are WNL except for RDW at 15.7.  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 -Recommended a daily Vitamin B complex -Recommended that the pt continue to eat well, drink at least 48-64 oz of water each day, and walk 20-30 minutes each day.  -Advised pt to continue taking 2,000 mcg of Vitamin B12 daily sublingual  -Counselled patient on healthy eating habits -Will see back in 6 months with labs    FOLLOW UP: IV feraheme weekly x 2 doses starting in the nxt 1-2 weeks RTC with Dr Candise CheKale with labs in 6 months   The total time spent in the appt was 25 minutes and more than 50% was on counseling and direct patient cares.  All of the patient's questions were answered with apparent satisfaction. The patient knows to call the clinic with any problems, questions or concerns.   Wyvonnia LoraGautam Kale MD MS AAHIVMS [facility] Hematology/Oncology Physician [facility]  (Office):       [phone] (Work cell):  [phone] (Fax):           [phone]  09/28/2018 11:29 AM   I, Carollee HerterJazzmine Knight, am acting as a scribe for Dr. Wyvonnia LoraGautam Kale.   .I have reviewed the above documentation for accuracy and completeness, and I agree with the above. Johney Maine.Gautam Kishore Kale MD

## 2018-09-28 ENCOUNTER — Inpatient Hospital Stay: Payer: BC Managed Care – PPO | Attending: Hematology

## 2018-09-28 ENCOUNTER — Other Ambulatory Visit: Payer: Self-pay

## 2018-09-28 ENCOUNTER — Inpatient Hospital Stay (HOSPITAL_BASED_OUTPATIENT_CLINIC_OR_DEPARTMENT_OTHER): Payer: BC Managed Care – PPO | Admitting: Hematology

## 2018-09-28 VITALS — BP 128/83 | HR 97 | Temp 98.7°F | Resp 18 | Ht 69.0 in | Wt 239.0 lb

## 2018-09-28 DIAGNOSIS — Z79899 Other long term (current) drug therapy: Secondary | ICD-10-CM | POA: Diagnosis not present

## 2018-09-28 DIAGNOSIS — E538 Deficiency of other specified B group vitamins: Secondary | ICD-10-CM | POA: Insufficient documentation

## 2018-09-28 DIAGNOSIS — F329 Major depressive disorder, single episode, unspecified: Secondary | ICD-10-CM | POA: Diagnosis not present

## 2018-09-28 DIAGNOSIS — Z833 Family history of diabetes mellitus: Secondary | ICD-10-CM | POA: Insufficient documentation

## 2018-09-28 DIAGNOSIS — Z9884 Bariatric surgery status: Secondary | ICD-10-CM

## 2018-09-28 DIAGNOSIS — Z8249 Family history of ischemic heart disease and other diseases of the circulatory system: Secondary | ICD-10-CM | POA: Insufficient documentation

## 2018-09-28 DIAGNOSIS — R5383 Other fatigue: Secondary | ICD-10-CM | POA: Insufficient documentation

## 2018-09-28 DIAGNOSIS — D509 Iron deficiency anemia, unspecified: Secondary | ICD-10-CM | POA: Insufficient documentation

## 2018-09-28 DIAGNOSIS — E559 Vitamin D deficiency, unspecified: Secondary | ICD-10-CM | POA: Diagnosis not present

## 2018-09-28 LAB — CBC WITH DIFFERENTIAL/PLATELET
Abs Immature Granulocytes: 0.02 10*3/uL (ref 0.00–0.07)
Basophils Absolute: 0 10*3/uL (ref 0.0–0.1)
Basophils Relative: 0 %
Eosinophils Absolute: 0.2 10*3/uL (ref 0.0–0.5)
Eosinophils Relative: 2 %
HCT: 43 % (ref 36.0–46.0)
Hemoglobin: 13.6 g/dL (ref 12.0–15.0)
Immature Granulocytes: 0 %
Lymphocytes Relative: 30 %
Lymphs Abs: 2.1 10*3/uL (ref 0.7–4.0)
MCH: 27 pg (ref 26.0–34.0)
MCHC: 31.6 g/dL (ref 30.0–36.0)
MCV: 85.3 fL (ref 80.0–100.0)
Monocytes Absolute: 0.6 10*3/uL (ref 0.1–1.0)
Monocytes Relative: 8 %
Neutro Abs: 4.3 10*3/uL (ref 1.7–7.7)
Neutrophils Relative %: 60 %
Platelets: 288 10*3/uL (ref 150–400)
RBC: 5.04 MIL/uL (ref 3.87–5.11)
RDW: 15.7 % — ABNORMAL HIGH (ref 11.5–15.5)
WBC: 7.2 10*3/uL (ref 4.0–10.5)
nRBC: 0 % (ref 0.0–0.2)

## 2018-09-28 LAB — FERRITIN: Ferritin: 49 ng/mL (ref 11–307)

## 2018-09-28 LAB — VITAMIN B12: Vitamin B-12: 188 pg/mL (ref 180–914)

## 2018-09-28 LAB — IRON AND TIBC
Iron: 61 ug/dL (ref 41–142)
Saturation Ratios: 18 % — ABNORMAL LOW (ref 21–57)
TIBC: 345 ug/dL (ref 236–444)
UIBC: 284 ug/dL (ref 120–384)

## 2018-09-29 ENCOUNTER — Telehealth: Payer: Self-pay | Admitting: Hematology

## 2018-09-29 NOTE — Telephone Encounter (Signed)
Called and left a voice message of scheduled appts per 9/8 los.

## 2018-10-05 ENCOUNTER — Inpatient Hospital Stay: Payer: BC Managed Care – PPO

## 2018-10-12 ENCOUNTER — Inpatient Hospital Stay: Payer: BC Managed Care – PPO

## 2018-10-25 ENCOUNTER — Other Ambulatory Visit: Payer: Self-pay

## 2018-10-25 ENCOUNTER — Encounter (HOSPITAL_COMMUNITY): Payer: Self-pay

## 2018-10-25 ENCOUNTER — Ambulatory Visit (HOSPITAL_COMMUNITY)
Admission: RE | Admit: 2018-10-25 | Discharge: 2018-10-25 | Disposition: A | Discharge status: Home / Self Care | Payer: BC Managed Care – PPO | Source: Ambulatory Visit | Attending: Internal Medicine | Admitting: Internal Medicine

## 2018-10-25 MED ORDER — SODIUM CHLORIDE 0.9 % IV SOLN
510.0000 mg | Freq: Once | INTRAVENOUS | Status: DC
  Filled 2018-10-25: qty 17

## 2018-10-25 MED ORDER — SODIUM CHLORIDE 0.9 % IV SOLN: INTRAVENOUS | Status: DC | PRN

## 2018-10-31 ENCOUNTER — Other Ambulatory Visit: Payer: Self-pay | Admitting: Hematology

## 2018-10-31 DIAGNOSIS — E559 Vitamin D deficiency, unspecified: Secondary | ICD-10-CM

## 2018-11-02 ENCOUNTER — Ambulatory Visit (HOSPITAL_COMMUNITY): Payer: BC Managed Care – PPO

## 2018-11-19 ENCOUNTER — Other Ambulatory Visit: Payer: Self-pay | Admitting: Sports Medicine

## 2018-11-19 ENCOUNTER — Other Ambulatory Visit: Payer: Self-pay

## 2018-11-19 ENCOUNTER — Ambulatory Visit (INDEPENDENT_AMBULATORY_CARE_PROVIDER_SITE_OTHER): Payer: Managed Care, Other (non HMO)

## 2018-11-19 ENCOUNTER — Ambulatory Visit: Payer: BC Managed Care – PPO | Admitting: Sports Medicine

## 2018-11-19 ENCOUNTER — Encounter: Payer: Self-pay | Admitting: Sports Medicine

## 2018-11-19 DIAGNOSIS — M79672 Pain in left foot: Secondary | ICD-10-CM

## 2018-11-19 DIAGNOSIS — M25572 Pain in left ankle and joints of left foot: Secondary | ICD-10-CM

## 2018-11-19 DIAGNOSIS — M779 Enthesopathy, unspecified: Secondary | ICD-10-CM

## 2018-11-19 MED ORDER — PREDNISONE 10 MG (21) PO TBPK: ORAL_TABLET | ORAL | 0 refills | Status: DC

## 2018-11-19 NOTE — Progress Notes (Signed)
Subjective:  [name] [name] is a 38 y.o. female patient who presents to office for evaluation of left ankle pain. Patient complains of continued pain in the ankle over the last 3 to 4 weeks pain is 7 out of 10 reports some swelling and reports that she cannot do much range of motion due to the sharp pain especially worse with pressure reports that she has tried ankle brace icing icing with no relief patient denies any weakness or injury but does report that she had been doing a lot of exercising from a biking to walking to hiking and is not sure if this caused her ankle to become painful.  Patient reports that she was doing all this exercise to try to help lose weight.  Patient denies any other pedal complaints at this time.  Review of Systems  All other systems reviewed and are negative.   Patient Active Problem List   Diagnosis Date Noted  . Vitamin B12 deficiency 07/21/2017  . Iron deficiency 07/21/2017  . Vitamin D deficiency 07/21/2017  . Iron deficiency anemia 06/29/2017  . Gastric bypass status for obesity 02/23/2013    Current Outpatient Medications on File Prior to Visit  Medication Sig Dispense Refill  . ARIPiprazole (ABILIFY) 10 MG tablet Take 10 mg by mouth daily.    . cetirizine (ZYRTEC) 10 MG tablet Take 1 tablet (10 mg total) by mouth daily. 90 tablet 3  . Cyanocobalamin (B-12) 1000 MCG SUBL Place 2,000 mcg under the tongue daily. 30 each 3  . FERREX 150 150 MG capsule TAKE 1 CAPSULE BY MOUTH EVERY DAY 90 capsule 1  . folic acid (FOLVITE) 800 MCG tablet Take 800 mcg by mouth daily.    . hyoscyamine (LEVSIN SL) 0.125 MG SL tablet Place 1 tablet (0.125 mg total) under the tongue every 6 (six) hours as needed. 30 tablet 2  . sertraline (ZOLOFT) 50 MG tablet Take 50 mg by mouth daily.    . Vitamin D, Ergocalciferol, (DRISDOL) 1.25 MG (50000 UT) CAPS capsule TAKE ONE CAPSULE BY MOUTH TWICE WEEKLY 24 capsule 2   No current facility-administered medications on file prior  to visit.     Allergies  Allergen Reactions  . Metformin And Related Itching    Objective:  General: Alert and oriented x3 in no acute distress  Dermatology: No open lesions bilateral lower extremities, no webspace macerations, no ecchymosis bilateral, all nails x 10 are well manicured.  Vascular: Dorsalis Pedis and Posterior Tibial pedal pulses palpable, Capillary Fill Time 3 seconds,(+) pedal hair growth bilateral, no edema bilateral lower extremities, Temperature gradient within normal limits.  Neurology: Michaell Cowing sensation intact via light touch bilateral.  Musculoskeletal: Mild tenderness with palpation at left ankle over the extensor tendons and with plantarflexion and inversion and eversion on the left.  Negative talar tilt, Negative tib-fib stress, No instability. No pain with calf compression bilateral. Range of motion within normal limits with mild guarding on left ankle. Strength within normal limits in all groups bilateral.   Gait: Antalgic gait  Xrays  Left ankle   Impression: No acute osseous findings  Assessment and Plan: Problem List Items Addressed This Visit    None    Visit Diagnoses    Tendinitis    -  Primary   Acute left ankle pain           -Complete examination performed -Xrays reviewed -Discussed treatment options for likely tendinitis -Rx prednisone Dosepak -Dispensed cam boot and advised patient to use every day  for the next 2 weeks after 2 weeks may transition to tennis shoe with Surgitube compression sleeve -Advise rest ice elevation topical creams and rubs as needed -Patient to return to office as in 3 weeks or sooner if condition worsens.  Landis Martins, DPM

## 2018-12-09 ENCOUNTER — Ambulatory Visit: Payer: Managed Care, Other (non HMO) | Admitting: Sports Medicine

## 2018-12-29 ENCOUNTER — Other Ambulatory Visit: Payer: Self-pay

## 2018-12-29 ENCOUNTER — Ambulatory Visit (HOSPITAL_COMMUNITY)
Admission: RE | Admit: 2018-12-29 | Discharge: 2018-12-29 | Disposition: A | Discharge status: Home / Self Care | Payer: Managed Care, Other (non HMO) | Source: Ambulatory Visit | Attending: Internal Medicine | Admitting: Internal Medicine

## 2018-12-29 DIAGNOSIS — D509 Iron deficiency anemia, unspecified: Secondary | ICD-10-CM | POA: Insufficient documentation

## 2018-12-29 MED ORDER — SODIUM CHLORIDE 0.9 % IV SOLN
510.0000 mg | Freq: Once | INTRAVENOUS | Status: AC
  Administered 2018-12-29: 510 mg via INTRAVENOUS
  Filled 2018-12-29: qty 17

## 2018-12-29 MED ORDER — SODIUM CHLORIDE 0.9 % IV SOLN
INTRAVENOUS | Status: DC | PRN
  Administered 2018-12-29: 12:00:00 250 mL via INTRAVENOUS

## 2018-12-29 NOTE — Discharge Instructions (Signed)

## 2018-12-29 NOTE — Progress Notes (Signed)
Patient received IV Feraheme as ordered. Patient declined the 30 minutes post infusion observation.Tolerated well, vitals stable, discharge instructions given, verbalized understanding. Patient alert, oriented and ambulatory at the time of discharge.

## 2019-01-07 ENCOUNTER — Ambulatory Visit: Payer: Managed Care, Other (non HMO) | Admitting: Sports Medicine

## 2019-01-24 ENCOUNTER — Telehealth: Payer: Self-pay | Admitting: Family Medicine

## 2019-01-24 NOTE — Telephone Encounter (Signed)
Please advise 

## 2019-01-24 NOTE — Telephone Encounter (Signed)
Pt stated she tested positive on 01/20/19 for Covid 19. She has congestion, cough and lost of taste and smell. She would like to know if Dr. Salomon Fick can send a rx to her pharmacy to help with this. Please advise.  [id number] - [place], [place] - [address] Phone:  [phone]  Fax:  [phone]

## 2019-04-01 ENCOUNTER — Telehealth: Payer: Self-pay | Admitting: Family Medicine

## 2019-04-01 ENCOUNTER — Other Ambulatory Visit: Payer: Self-pay | Admitting: Family Medicine

## 2019-04-01 NOTE — Telephone Encounter (Signed)
Pt is scheduled for a virtual visit with Dr Salomon Fick on 04/04/2019 at 4 pm

## 2019-04-01 NOTE — Telephone Encounter (Signed)
Pt would like to know if you Dr. Salomon Fick, would be willing to start prescribing Sertraline 75 mg and Aripiprazole 2 mg. Pt stopped taking medications about a month ago and needs to restart taking them. Pt has questions about dosages if Dr. Salomon Fick, agrees to start prescribing medication. Thanks

## 2019-04-04 ENCOUNTER — Telehealth (INDEPENDENT_AMBULATORY_CARE_PROVIDER_SITE_OTHER): Payer: Managed Care, Other (non HMO) | Admitting: Family Medicine

## 2019-04-04 DIAGNOSIS — J302 Other seasonal allergic rhinitis: Secondary | ICD-10-CM

## 2019-04-04 DIAGNOSIS — F339 Major depressive disorder, recurrent, unspecified: Secondary | ICD-10-CM

## 2019-04-04 MED ORDER — SERTRALINE HCL 50 MG PO TABS: 50.0000 mg | ORAL_TABLET | Freq: Every day | ORAL | 1 refills | Status: DC

## 2019-04-04 NOTE — Progress Notes (Signed)
Virtual Visit via Video Note  I connected with [name] [name] on 04/04/19 at  4:00 PM EDT by a video enabled telemedicine application 2/2 [id number] pandemic and verified that I am speaking with the correct person using two identifiers.  Location patient: home Location provider:work or home office Persons participating in the virtual visit: patient, provider  I discussed the limitations of evaluation and management by telemedicine and the availability of in person appointments. The patient expressed understanding and agreed to proceed.   HPI: Pt is a 39 yo female with pmh sig for h/o anxiety, h/o depression, HTN, obesity s/p Roux en y, DM II, and GERD.  Pt stopped taking sertraline 75 mg and Abilify 2 mg 2 months ago.  Meds were rx'd by her psychiatrist, but pt would need to re-establish care and her provider is no longer taking new pts.  Pt recently seen by her therapist at [facility] who said she was depressed.  Pt just wants to sleep or cry.  States she is treading water in all aspects of her life.  Pt endorses weight gain, decreased motivation.  Denies SI/HI.  Pt moved into a new home with her husband, started a business, is working full time, and is in school.  Pt states she needs to get refocused on school.  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  Pt mentions her dog is in the new home which helps.  Pt misses being able to socialize more, go on trips, and get out of the house.  Pt notes her insurance company stopped covering prescription cetirizine.  ROS: See pertinent positives and negatives per HPI.  Past Medical History:  Diagnosis Date  . Anxiety   . Depression   . Diabetes mellitus 05/2008   DR. BALAN  . GERD (gastroesophageal reflux disease)    after taking antibiotics it went away  . Hypertension   . Obesity     Past Surgical History:  Procedure Laterality Date  . BREATH TEK H PYLORI N/A 08/20/2012   Procedure: BREATH TEK H PYLORI;  Surgeon: Madilyn Hook,  DO;  Location: [facility];  Service: Endoscopy;  Laterality: N/A;  . GASTRIC ROUX-EN-Y N/A 08/24/2012   Procedure: LAPAROSCOPIC ROUX-EN-Y GASTRIC BYPASS WITH UPPER ENDOSCOPY;  Surgeon: Madilyn Hook, DO;  Location: [facility];  Service: General;  Laterality: N/A;    Family History  Problem Relation Age of Onset  . Diabetes Mother   . Hypertension Mother   . Miscarriages / [place] Mother   . Diabetes Maternal Aunt   . Diabetes Maternal Grandfather   . Dementia Maternal Grandmother   . Diabetes Maternal Grandmother   . Mental illness Brother     Current Outpatient Medications:  .  ARIPiprazole (ABILIFY) 10 MG tablet, Take 10 mg by mouth daily., Disp: , Rfl:  .  cetirizine (ZYRTEC) 10 MG tablet, TAKE 1 TABLET BY MOUTH EVERY DAY, Disp: 90 tablet, Rfl: 0 .  Cyanocobalamin (B-12) 1000 MCG SUBL, Place 2,000 mcg under the tongue daily., Disp: 30 each, Rfl: 3 .  FERREX 150 150 MG capsule, TAKE 1 CAPSULE BY MOUTH EVERY DAY, Disp: 90 capsule, Rfl: 1 .  folic acid (FOLVITE) 233 MCG tablet, Take 800 mcg by mouth daily., Disp: , Rfl:  .  hyoscyamine (LEVSIN SL) 0.125 MG SL tablet, Place 1 tablet (0.125 mg total) under the tongue every 6 (six) hours as needed., Disp: 30 tablet, Rfl: 2 .  predniSONE (STERAPRED UNI-PAK 21 TAB) 10 MG (21) TBPK tablet, Take as  directed, Disp: 21 tablet, Rfl: 0 .  sertraline (ZOLOFT) 50 MG tablet, Take 50 mg by mouth daily., Disp: , Rfl:  .  Vitamin D, Ergocalciferol, (DRISDOL) 1.25 MG (50000 UT) CAPS capsule, TAKE ONE CAPSULE BY MOUTH TWICE WEEKLY, Disp: 24 capsule, Rfl: 2  EXAM:  VITALS per patient if applicable: RR between 12-20 bpm  GENERAL: alert, oriented, appears well and in no acute distress  HEENT: atraumatic, conjunctiva clear, no obvious abnormalities on inspection of external nose and ears  NECK: normal movements of the head and neck  LUNGS: on inspection no signs of respiratory distress, breathing rate appears normal, no obvious gross SOB, gasping or  wheezing  CV: no obvious cyanosis  MS: moves all visible extremities without noticeable abnormality  PSYCH/NEURO: pleasant and cooperative, no obvious depression or anxiety, speech and thought processing grossly intact  ASSESSMENT AND PLAN:  Discussed the following assessment and plan:  Depression, recurrent (HCC)  -increased symptoms since d/c zoloft 75 mg and abilify 2 mg -will restart Zoloft.  Pt to take Zoloft 25 mg x 1 wk, then increase to 50 mg daily -will f/u in 4-6 wks, sooner if needed. -will increase dose to 75 if needed. -will continue to hold off on adding Abilify 2 mg back -continue counseling - Plan: sertraline (ZOLOFT) 50 MG tablet  Seasonal allergies -insurance no longer covering rx cetirizine -will start OTC cetirizine prn  F/u in 4 wks, sooner if needed.  I discussed the assessment and treatment plan with the patient. The patient was provided an opportunity to ask questions and all were answered. The patient agreed with the plan and demonstrated an understanding of the instructions.   The patient was advised to call back or seek an in-person evaluation if the symptoms worsen or if the condition fails to improve as anticipated.   Deeann Saint, MD

## 2019-04-11 ENCOUNTER — Inpatient Hospital Stay: Payer: Managed Care, Other (non HMO) | Attending: Family Medicine

## 2019-04-11 ENCOUNTER — Inpatient Hospital Stay: Payer: Managed Care, Other (non HMO) | Admitting: Hematology

## 2019-04-18 ENCOUNTER — Other Ambulatory Visit: Payer: Self-pay | Admitting: Family Medicine

## 2019-04-18 DIAGNOSIS — F339 Major depressive disorder, recurrent, unspecified: Secondary | ICD-10-CM

## 2019-07-19 ENCOUNTER — Other Ambulatory Visit: Payer: Self-pay | Admitting: Family Medicine

## 2019-07-19 DIAGNOSIS — F339 Major depressive disorder, recurrent, unspecified: Secondary | ICD-10-CM

## 2019-07-19 NOTE — Telephone Encounter (Signed)
Left a message for pt to call the office and scheduled a f/u appointment for her Sertraline (Zoloft)

## 2019-07-21 NOTE — Telephone Encounter (Signed)
Attempted to call pt several times to schedule a medication refill Sent pt a MyChart message to call office for a medication f/u appointment for her Zoloft

## 2019-07-22 NOTE — Telephone Encounter (Signed)
Called pt 2x no answer. Lm for pt to return call to schedule a f/u appt. No answer. Rx denied.

## 2019-08-03 ENCOUNTER — Telehealth (INDEPENDENT_AMBULATORY_CARE_PROVIDER_SITE_OTHER): Payer: Managed Care, Other (non HMO) | Admitting: Family Medicine

## 2019-08-03 ENCOUNTER — Encounter: Payer: Self-pay | Admitting: Family Medicine

## 2019-08-03 VITALS — Ht 69.0 in | Wt 236.0 lb

## 2019-08-03 DIAGNOSIS — F339 Major depressive disorder, recurrent, unspecified: Secondary | ICD-10-CM

## 2019-08-03 DIAGNOSIS — R635 Abnormal weight gain: Secondary | ICD-10-CM | POA: Diagnosis not present

## 2019-08-03 DIAGNOSIS — L309 Dermatitis, unspecified: Secondary | ICD-10-CM

## 2019-08-03 MED ORDER — HYDROCORTISONE VALERATE 0.2 % EX OINT: 1.0000 "application " | TOPICAL_OINTMENT | Freq: Two times a day (BID) | CUTANEOUS | 1 refills | Status: DC

## 2019-08-03 MED ORDER — SERTRALINE HCL 50 MG PO TABS: 50.0000 mg | ORAL_TABLET | Freq: Every day | ORAL | 0 refills | Status: DC

## 2019-08-03 NOTE — Progress Notes (Signed)
Virtual Visit via Video Note  I connected with [name] [name] on 08/03/19 at  4:00 PM EDT by a video enabled telemedicine application 2/2 COVID-19 pandemic and verified that I am speaking with the correct person using two identifiers.  Location patient: home Location provider:work or home office Persons participating in the virtual visit: patient, provider  I discussed the limitations of evaluation and management by telemedicine and the availability of in person appointments. The patient expressed understanding and agreed to proceed.   HPI: Pt is a 39 yo female with pmh sig for HTN, h/o gastric bypass, seasonal allergies, vit d def, vit b12 def, iron def anemia, anxiety, and depression who states she has been feeling better now that she is on meds. Taking Zoloft 50 mg daily.  Notes improvement in energy and mood.  Pt started the [employer] program- a prepackaged meal plan.  She is on wk 2.  Pt eating several meals per day.  Pt states she has rash and pruritis off and on.  Denies changes in soaps, lotions, and detergents.    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  She has been able to pace herself to get orders filled by working from a wait list.  Pt is also in grad school.  She states she needs to focus on writing her dissertation.  ROS: See pertinent positives and negatives per HPI.  Past Medical History:  Diagnosis Date  . Anxiety   . Depression   . Diabetes mellitus 05/2008   DR. BALAN  . GERD (gastroesophageal reflux disease)    after taking antibiotics it went away  . Hypertension   . Obesity     Past Surgical History:  Procedure Laterality Date  . BREATH TEK H PYLORI N/A 08/20/2012   Procedure: BREATH TEK H PYLORI;  Surgeon: Lodema Pilot, DO;  Location: [facility];  Service: Endoscopy;  Laterality: N/A;  . GASTRIC ROUX-EN-Y N/A 08/24/2012   Procedure: LAPAROSCOPIC ROUX-EN-Y GASTRIC BYPASS WITH UPPER ENDOSCOPY;  Surgeon: Lodema Pilot, DO;  Location: [facility];  Service:  General;  Laterality: N/A;    Family History  Problem Relation Age of Onset  . Diabetes Mother   . Hypertension Mother   . Miscarriages / [place] Mother   . Diabetes Maternal Aunt   . Diabetes Maternal Grandfather   . Dementia Maternal Grandmother   . Diabetes Maternal Grandmother   . Mental illness Brother      Current Outpatient Medications:  .  ARIPiprazole (ABILIFY) 10 MG tablet, Take 10 mg by mouth daily., Disp: , Rfl:  .  cetirizine (ZYRTEC) 10 MG tablet, TAKE 1 TABLET BY MOUTH EVERY DAY, Disp: 90 tablet, Rfl: 0 .  Cyanocobalamin (B-12) 1000 MCG SUBL, Place 2,000 mcg under the tongue daily., Disp: 30 each, Rfl: 3 .  FERREX 150 150 MG capsule, TAKE 1 CAPSULE BY MOUTH EVERY DAY, Disp: 90 capsule, Rfl: 1 .  folic acid (FOLVITE) 800 MCG tablet, Take 800 mcg by mouth daily., Disp: , Rfl:  .  hyoscyamine (LEVSIN SL) 0.125 MG SL tablet, Place 1 tablet (0.125 mg total) under the tongue every 6 (six) hours as needed., Disp: 30 tablet, Rfl: 2 .  predniSONE (STERAPRED UNI-PAK 21 TAB) 10 MG (21) TBPK tablet, Take as directed, Disp: 21 tablet, Rfl: 0 .  sertraline (ZOLOFT) 50 MG tablet, TAKE 1 TABLET BY MOUTH EVERY DAY, Disp: 90 tablet, Rfl: 0 .  Vitamin D, Ergocalciferol, (DRISDOL) 1.25 MG (50000 UT) CAPS capsule, TAKE ONE CAPSULE BY MOUTH  TWICE WEEKLY, Disp: 24 capsule, Rfl: 2  EXAM:  VITALS per patient if applicable: RR between 12-20 bpm  GENERAL: alert, oriented, appears well and in no acute distress  HEENT: atraumatic, conjunctiva clear, no obvious abnormalities on inspection of external nose and ears  NECK: normal movements of the head and neck  LUNGS: on inspection no signs of respiratory distress, breathing rate appears normal, no obvious gross SOB, gasping or wheezing  CV: no obvious cyanosis  MS: moves all visible extremities without noticeable abnormality  PSYCH/NEURO: pleasant and cooperative, no obvious depression or anxiety, speech and thought processing grossly  intact  ASSESSMENT AND PLAN:  Discussed the following assessment and plan:  Depression, recurrent (HCC)  -stable -continue zoloft 50 mg -self care encouraged. - Plan: sertraline (ZOLOFT) 50 MG tablet  Dermatitis -discussed possible causes including eczema, medications -pt advised to try switching from Zyrtec to a different allergy med as Zyrtec can cause pruritis.  - Plan: hydrocortisone valerate ointment (WESTCORT) 0.2 %  Weight gain -Losing weight with [employer] diet program -continue to increase physical activity -will continue to monitor  F/u in the next 1-2 months as needed.   I discussed the assessment and treatment plan with the patient. The patient was provided an opportunity to ask questions and all were answered. The patient agreed with the plan and demonstrated an understanding of the instructions.   The patient was advised to call back or seek an in-person evaluation if the symptoms worsen or if the condition fails to improve as anticipated.  I provided  25 minutes of non-face-to-face time during this encounter.   Deeann Saint, MD

## 2019-08-08 ENCOUNTER — Telehealth: Payer: Self-pay

## 2019-08-08 ENCOUNTER — Encounter: Payer: Self-pay | Admitting: Family Medicine

## 2019-08-08 NOTE — Telephone Encounter (Signed)
Pt had a MyChart Video visit on 08/03/2019, nothing further needed

## 2019-11-08 ENCOUNTER — Ambulatory Visit: Payer: Managed Care, Other (non HMO) | Attending: Internal Medicine

## 2019-11-08 DIAGNOSIS — Z23 Encounter for immunization: Secondary | ICD-10-CM

## 2019-11-08 NOTE — Progress Notes (Signed)
   Covid-19 Vaccination Clinic  Name:  [place] Rochelle Storti    MRN: [id number] DOB: 1980-06-27  11/08/2019  Ms. Bergemann was observed post Covid-19 immunization for 15 minutes without incident. She was provided with Vaccine Information Sheet and instruction to access the V-Safe system.   Ms. Rosenkranz was instructed to call 911 with any severe reactions post vaccine: Marland Kitchen Difficulty breathing  . Swelling of face and throat  . A fast heartbeat  . A bad rash all over body  . Dizziness and weakness

## 2019-11-13 ENCOUNTER — Other Ambulatory Visit: Payer: Self-pay | Admitting: Family Medicine

## 2019-11-13 DIAGNOSIS — F339 Major depressive disorder, recurrent, unspecified: Secondary | ICD-10-CM

## 2019-11-15 ENCOUNTER — Telehealth: Payer: Self-pay | Admitting: Family Medicine

## 2019-11-15 NOTE — Telephone Encounter (Signed)
Pt is calling in to see if her Rx sertraline (ZOLOFT) 50 MG can be increased back to 75 MG.  Pt decline a appointment and would like to have a all back.  [facility] [place] [place].

## 2019-11-16 NOTE — Telephone Encounter (Signed)
Pt is scheduled for a f/u MyChart visit to discuss increasing Rx Zoloft from 50 mg to 75 mg

## 2019-11-17 ENCOUNTER — Telehealth: Payer: Managed Care, Other (non HMO) | Admitting: Family Medicine

## 2019-11-21 ENCOUNTER — Telehealth: Payer: Managed Care, Other (non HMO) | Admitting: Family Medicine

## 2019-11-21 ENCOUNTER — Encounter: Payer: Self-pay | Admitting: Family Medicine

## 2019-11-21 DIAGNOSIS — F321 Major depressive disorder, single episode, moderate: Secondary | ICD-10-CM | POA: Diagnosis not present

## 2019-11-21 DIAGNOSIS — F419 Anxiety disorder, unspecified: Secondary | ICD-10-CM

## 2019-11-21 MED ORDER — SERTRALINE HCL 50 MG PO TABS: 75.0000 mg | ORAL_TABLET | Freq: Every day | ORAL | 2 refills | Status: DC

## 2019-11-21 NOTE — Progress Notes (Signed)
Virtual Visit via Video Note  I connected with [name] [name] on 11/21/19 at  4:00 PM EDT by a video enabled telemedicine application 2/2 COVID-19 pandemic and verified that I am speaking with the correct person using two identifiers.  Location patient: home Location provider:work or home office Persons participating in the virtual visit: patient, provider  I discussed the limitations of evaluation and management by telemedicine and the availability of in person appointments. The patient expressed understanding and agreed to proceed.   HPI: Pt is a 39 year old female with past medical history significant for vitamin D deficiency, B12 deficiency, seasonal allergies, DM 2 history of Roux-en-Y gastric bypass with a hightened sense of anxiety.  On Saturday couldn't get out of bed.  Recently in a place where she is not enjoying herself, just going through the motions so others will not notice.  Was on zoloft 75 mg and abilify 10 mg.  Feels like could cry at any little thing.  Pt has an appointment coming up with her counselor.   Pt notes decreased libido, apathy, irritability.  Patient is concerned that her mood may eventually affect her marriage.   ROS: See pertinent positives and negatives per HPI.  Past Medical History:  Diagnosis Date  . Anxiety   . Depression   . Diabetes mellitus 05/2008   DR. BALAN  . GERD (gastroesophageal reflux disease)    after taking antibiotics it went away  . Hypertension   . Obesity     Past Surgical History:  Procedure Laterality Date  . BREATH TEK H PYLORI N/A 08/20/2012   Procedure: BREATH TEK H PYLORI;  Surgeon: Lodema Pilot, DO;  Location: [facility];  Service: Endoscopy;  Laterality: N/A;  . GASTRIC ROUX-EN-Y N/A 08/24/2012   Procedure: LAPAROSCOPIC ROUX-EN-Y GASTRIC BYPASS WITH UPPER ENDOSCOPY;  Surgeon: Lodema Pilot, DO;  Location: [facility];  Service: General;  Laterality: N/A;    Family History  Problem Relation Age of Onset  . Diabetes Mother   .  Hypertension Mother   . Miscarriages / [place] Mother   . Diabetes Maternal Aunt   . Diabetes Maternal Grandfather   . Dementia Maternal Grandmother   . Diabetes Maternal Grandmother   . Mental illness Brother      Current Outpatient Medications:  .  ARIPiprazole (ABILIFY) 10 MG tablet, Take 10 mg by mouth daily., Disp: , Rfl:  .  cetirizine (ZYRTEC) 10 MG tablet, TAKE 1 TABLET BY MOUTH EVERY DAY, Disp: 90 tablet, Rfl: 0 .  Cyanocobalamin (B-12) 1000 MCG SUBL, Place 2,000 mcg under the tongue daily., Disp: 30 each, Rfl: 3 .  FERREX 150 150 MG capsule, TAKE 1 CAPSULE BY MOUTH EVERY DAY, Disp: 90 capsule, Rfl: 1 .  folic acid (FOLVITE) 800 MCG tablet, Take 800 mcg by mouth daily., Disp: , Rfl:  .  hydrocortisone valerate ointment (WESTCORT) 0.2 %, Apply 1 application topically 2 (two) times daily., Disp: 60 g, Rfl: 1 .  hyoscyamine (LEVSIN SL) 0.125 MG SL tablet, Place 1 tablet (0.125 mg total) under the tongue every 6 (six) hours as needed., Disp: 30 tablet, Rfl: 2 .  predniSONE (STERAPRED UNI-PAK 21 TAB) 10 MG (21) TBPK tablet, Take as directed, Disp: 21 tablet, Rfl: 0 .  sertraline (ZOLOFT) 50 MG tablet, TAKE 1 TABLET BY MOUTH EVERY DAY, Disp: 90 tablet, Rfl: 0 .  Vitamin D, Ergocalciferol, (DRISDOL) 1.25 MG (50000 UT) CAPS capsule, TAKE ONE CAPSULE BY MOUTH TWICE WEEKLY, Disp: 24 capsule, Rfl: 2  EXAM:  VITALS per  patient if applicable:RR between 12-20 bpm  GENERAL: alert, oriented, appears well and in no acute distress  HEENT: atraumatic, conjunctiva clear, no obvious abnormalities on inspection of external nose and ears  NECK: normal movements of the head and neck  LUNGS: on inspection no signs of respiratory distress, breathing rate appears normal, no obvious gross SOB, gasping or wheezing  CV: no obvious cyanosis  MS: moves all visible extremities without noticeable abnormality  PSYCH/NEURO: pleasant and cooperative, no obvious depression or anxiety, speech and thought  processing grossly intact  ASSESSMENT AND PLAN:  Discussed the following assessment and plan:  Anxiety  -GAD-7 score -Patient to continue counseling -We will increase Zoloft dose from 50 to 75 mg daily - Plan: sertraline (ZOLOFT) 50 MG tablet  Depression, major, single episode, moderate (HCC) -PHQ-9 score 15 -Continue counseling -We will increase Zoloft dose from 50 to 75 mg daily - Plan: sertraline (ZOLOFT) 50 MG tablet  Follow-up in 4-6 weeks, sooner if needed   I discussed the assessment and treatment plan with the patient. The patient was provided an opportunity to ask questions and all were answered. The patient agreed with the plan and demonstrated an understanding of the instructions.   The patient was advised to call back or seek an in-person evaluation if the symptoms worsen or if the condition fails to improve as anticipated.   Deeann Saint, MD

## 2019-11-27 NOTE — Progress Notes (Signed)
Error

## 2020-01-13 ENCOUNTER — Other Ambulatory Visit: Payer: Self-pay | Admitting: Family Medicine

## 2020-01-13 DIAGNOSIS — F321 Major depressive disorder, single episode, moderate: Secondary | ICD-10-CM

## 2020-01-13 DIAGNOSIS — F419 Anxiety disorder, unspecified: Secondary | ICD-10-CM

## 2020-02-20 ENCOUNTER — Other Ambulatory Visit: Payer: Self-pay

## 2020-02-20 ENCOUNTER — Emergency Department (HOSPITAL_BASED_OUTPATIENT_CLINIC_OR_DEPARTMENT_OTHER): Payer: Managed Care, Other (non HMO)

## 2020-02-20 ENCOUNTER — Encounter (HOSPITAL_BASED_OUTPATIENT_CLINIC_OR_DEPARTMENT_OTHER): Payer: Self-pay | Admitting: *Deleted

## 2020-02-20 ENCOUNTER — Inpatient Hospital Stay (HOSPITAL_BASED_OUTPATIENT_CLINIC_OR_DEPARTMENT_OTHER)
Admission: EM | Admit: 2020-02-20 | Discharge: 2020-02-23 | DRG: 418 | Disposition: A | Discharge status: Home / Self Care | Payer: Managed Care, Other (non HMO) | Attending: Internal Medicine | Admitting: Internal Medicine

## 2020-02-20 ENCOUNTER — Observation Stay (HOSPITAL_COMMUNITY): Payer: Managed Care, Other (non HMO)

## 2020-02-20 DIAGNOSIS — Z6831 Body mass index (BMI) 31.0-31.9, adult: Secondary | ICD-10-CM

## 2020-02-20 DIAGNOSIS — E669 Obesity, unspecified: Secondary | ICD-10-CM | POA: Diagnosis not present

## 2020-02-20 DIAGNOSIS — I10 Essential (primary) hypertension: Secondary | ICD-10-CM | POA: Diagnosis not present

## 2020-02-20 DIAGNOSIS — R109 Unspecified abdominal pain: Secondary | ICD-10-CM

## 2020-02-20 DIAGNOSIS — Z833 Family history of diabetes mellitus: Secondary | ICD-10-CM | POA: Diagnosis not present

## 2020-02-20 DIAGNOSIS — F32A Depression, unspecified: Secondary | ICD-10-CM | POA: Diagnosis not present

## 2020-02-20 DIAGNOSIS — D509 Iron deficiency anemia, unspecified: Secondary | ICD-10-CM | POA: Diagnosis present

## 2020-02-20 DIAGNOSIS — K219 Gastro-esophageal reflux disease without esophagitis: Secondary | ICD-10-CM | POA: Diagnosis not present

## 2020-02-20 DIAGNOSIS — K8021 Calculus of gallbladder without cholecystitis with obstruction: Secondary | ICD-10-CM

## 2020-02-20 DIAGNOSIS — R748 Abnormal levels of other serum enzymes: Secondary | ICD-10-CM | POA: Diagnosis not present

## 2020-02-20 DIAGNOSIS — Z8249 Family history of ischemic heart disease and other diseases of the circulatory system: Secondary | ICD-10-CM | POA: Diagnosis not present

## 2020-02-20 DIAGNOSIS — Z888 Allergy status to other drugs, medicaments and biological substances status: Secondary | ICD-10-CM | POA: Diagnosis not present

## 2020-02-20 DIAGNOSIS — K859 Acute pancreatitis without necrosis or infection, unspecified: Secondary | ICD-10-CM

## 2020-02-20 DIAGNOSIS — E119 Type 2 diabetes mellitus without complications: Secondary | ICD-10-CM | POA: Diagnosis present

## 2020-02-20 DIAGNOSIS — Z9884 Bariatric surgery status: Secondary | ICD-10-CM

## 2020-02-20 DIAGNOSIS — Z818 Family history of other mental and behavioral disorders: Secondary | ICD-10-CM | POA: Diagnosis not present

## 2020-02-20 DIAGNOSIS — K851 Biliary acute pancreatitis without necrosis or infection: Secondary | ICD-10-CM | POA: Diagnosis not present

## 2020-02-20 DIAGNOSIS — Z20822 Contact with and (suspected) exposure to covid-19: Secondary | ICD-10-CM | POA: Diagnosis not present

## 2020-02-20 DIAGNOSIS — K529 Noninfective gastroenteritis and colitis, unspecified: Secondary | ICD-10-CM | POA: Diagnosis not present

## 2020-02-20 DIAGNOSIS — Z79899 Other long term (current) drug therapy: Secondary | ICD-10-CM

## 2020-02-20 DIAGNOSIS — Z419 Encounter for procedure for purposes other than remedying health state, unspecified: Secondary | ICD-10-CM

## 2020-02-20 DIAGNOSIS — R7989 Other specified abnormal findings of blood chemistry: Secondary | ICD-10-CM | POA: Diagnosis not present

## 2020-02-20 DIAGNOSIS — F419 Anxiety disorder, unspecified: Secondary | ICD-10-CM | POA: Diagnosis not present

## 2020-02-20 DIAGNOSIS — F339 Major depressive disorder, recurrent, unspecified: Secondary | ICD-10-CM | POA: Diagnosis present

## 2020-02-20 DIAGNOSIS — K759 Inflammatory liver disease, unspecified: Secondary | ICD-10-CM

## 2020-02-20 LAB — URINALYSIS, ROUTINE W REFLEX MICROSCOPIC
Bilirubin Urine: NEGATIVE
Glucose, UA: NEGATIVE mg/dL
Hgb urine dipstick: NEGATIVE
Ketones, ur: NEGATIVE mg/dL
Leukocytes,Ua: NEGATIVE
Nitrite: NEGATIVE
Protein, ur: NEGATIVE mg/dL
Specific Gravity, Urine: 1.01 (ref 1.005–1.030)
pH: 7.5 (ref 5.0–8.0)

## 2020-02-20 LAB — COMPREHENSIVE METABOLIC PANEL
ALT: 1075 U/L — ABNORMAL HIGH (ref 0–44)
AST: 2704 U/L — ABNORMAL HIGH (ref 15–41)
Albumin: 3.8 g/dL (ref 3.5–5.0)
Alkaline Phosphatase: 148 U/L — ABNORMAL HIGH (ref 38–126)
Anion gap: 10 (ref 5–15)
BUN: 11 mg/dL (ref 6–20)
CO2: 26 mmol/L (ref 22–32)
Calcium: 8.6 mg/dL — ABNORMAL LOW (ref 8.9–10.3)
Chloride: 100 mmol/L (ref 98–111)
Creatinine, Ser: 0.77 mg/dL (ref 0.44–1.00)
GFR, Estimated: >60 mL/min (ref >60)
Glucose, Bld: 159 mg/dL — ABNORMAL HIGH (ref 70–99)
Potassium: 4 mmol/L (ref 3.5–5.1)
Sodium: 136 mmol/L (ref 135–145)
Total Bilirubin: 1.1 mg/dL (ref 0.3–1.2)
Total Protein: 7.6 g/dL (ref 6.5–8.1)

## 2020-02-20 LAB — CBC WITH DIFFERENTIAL/PLATELET
Abs Immature Granulocytes: 0.01 10*3/uL (ref 0.00–0.07)
Basophils Absolute: 0 10*3/uL (ref 0.0–0.1)
Basophils Relative: 0 %
Eosinophils Absolute: 0.1 10*3/uL (ref 0.0–0.5)
Eosinophils Relative: 2 %
HCT: 39.4 % (ref 36.0–46.0)
Hemoglobin: 12.3 g/dL (ref 12.0–15.0)
Immature Granulocytes: 0 %
Lymphocytes Relative: 19 %
Lymphs Abs: 0.9 10*3/uL (ref 0.7–4.0)
MCH: 25.8 pg — ABNORMAL LOW (ref 26.0–34.0)
MCHC: 31.2 g/dL (ref 30.0–36.0)
MCV: 82.6 fL (ref 80.0–100.0)
Monocytes Absolute: 0.4 10*3/uL (ref 0.1–1.0)
Monocytes Relative: 8 %
Neutro Abs: 3.4 10*3/uL (ref 1.7–7.7)
Neutrophils Relative %: 71 %
Platelets: 326 10*3/uL (ref 150–400)
RBC: 4.77 MIL/uL (ref 3.87–5.11)
RDW: 13.2 % (ref 11.5–15.5)
WBC: 4.8 10*3/uL (ref 4.0–10.5)
nRBC: 0 % (ref 0.0–0.2)

## 2020-02-20 LAB — HEPATITIS PANEL, ACUTE
HCV Ab: NONREACTIVE
Hep A IgM: NONREACTIVE
Hep B C IgM: NONREACTIVE
Hepatitis B Surface Ag: NONREACTIVE

## 2020-02-20 LAB — ACETAMINOPHEN LEVEL: Acetaminophen (Tylenol), Serum: <10 ug/mL — ABNORMAL LOW (ref 10–30)

## 2020-02-20 LAB — LIPASE, BLOOD: Lipase: 291 U/L — ABNORMAL HIGH (ref 11–51)

## 2020-02-20 LAB — SARS CORONAVIRUS 2 BY RT PCR (HOSPITAL ORDER, PERFORMED IN ~~LOC~~ HOSPITAL LAB): SARS Coronavirus 2: NEGATIVE

## 2020-02-20 LAB — PREGNANCY, URINE: Preg Test, Ur: NEGATIVE

## 2020-02-20 MED ORDER — LORATADINE 10 MG PO TABS
10.0000 mg | ORAL_TABLET | Freq: Every day | ORAL | Status: DC | PRN
  Administered 2020-02-20 – 2020-02-21 (×2): 10 mg via ORAL
  Filled 2020-02-20 (×3): qty 1

## 2020-02-20 MED ORDER — SODIUM CHLORIDE 0.9 % IV SOLN: INTRAVENOUS | Status: DC

## 2020-02-20 MED ORDER — OXYCODONE HCL 5 MG PO TABS: 5.0000 mg | ORAL_TABLET | ORAL | Status: DC | PRN

## 2020-02-20 MED ORDER — ONDANSETRON HCL 4 MG/2ML IJ SOLN: 4.0000 mg | Freq: Once | INTRAMUSCULAR | Status: DC

## 2020-02-20 MED ORDER — ENOXAPARIN SODIUM 40 MG/0.4ML ~~LOC~~ SOLN
40.0000 mg | SUBCUTANEOUS | Status: DC
  Administered 2020-02-21: 40 mg via SUBCUTANEOUS
  Filled 2020-02-20: qty 0.4

## 2020-02-20 MED ORDER — GADOBUTROL 1 MMOL/ML IV SOLN
9.0000 mL | Freq: Once | INTRAVENOUS | Status: AC | PRN
  Administered 2020-02-20: 9 mL via INTRAVENOUS

## 2020-02-20 MED ORDER — METOPROLOL TARTRATE 5 MG/5ML IV SOLN: 5.0000 mg | Freq: Four times a day (QID) | INTRAVENOUS | Status: DC | PRN

## 2020-02-20 MED ORDER — ONDANSETRON HCL 4 MG/2ML IJ SOLN: 4.0000 mg | Freq: Four times a day (QID) | INTRAMUSCULAR | Status: DC | PRN

## 2020-02-20 MED ORDER — SERTRALINE HCL 50 MG PO TABS
75.0000 mg | ORAL_TABLET | Freq: Every day | ORAL | Status: DC
  Administered 2020-02-21 – 2020-02-23 (×3): 75 mg via ORAL
  Filled 2020-02-20 (×4): qty 1

## 2020-02-20 MED ORDER — SODIUM CHLORIDE 0.9 % IV SOLN: Freq: Once | INTRAVENOUS | Status: AC

## 2020-02-20 MED ORDER — MORPHINE SULFATE (PF) 4 MG/ML IV SOLN
4.0000 mg | Freq: Once | INTRAVENOUS | Status: AC
  Administered 2020-02-20: 4 mg via INTRAVENOUS
  Filled 2020-02-20: qty 1

## 2020-02-20 MED ORDER — IBUPROFEN 400 MG PO TABS: 400.0000 mg | ORAL_TABLET | Freq: Four times a day (QID) | ORAL | Status: DC | PRN

## 2020-02-20 MED ORDER — MORPHINE SULFATE (PF) 4 MG/ML IV SOLN: 4.0000 mg | INTRAVENOUS | Status: DC | PRN

## 2020-02-20 MED ORDER — SODIUM CHLORIDE 0.9 % IV SOLN
1.0000 g | Freq: Once | INTRAVENOUS | Status: AC
  Administered 2020-02-20: 1 g via INTRAVENOUS
  Filled 2020-02-20: qty 10

## 2020-02-20 MED ORDER — LORATADINE 10 MG PO TABS: 10.0000 mg | ORAL_TABLET | Freq: Every day | ORAL | Status: DC

## 2020-02-20 MED ORDER — ONDANSETRON HCL 4 MG/2ML IJ SOLN
4.0000 mg | Freq: Once | INTRAMUSCULAR | Status: AC
  Administered 2020-02-20: 4 mg via INTRAVENOUS
  Filled 2020-02-20: qty 2

## 2020-02-20 NOTE — ED Notes (Signed)
EDP at bedside  

## 2020-02-20 NOTE — ED Triage Notes (Addendum)
C/o upper abd pain that started around 2100. States she ate fried chicken for dinner. Has not taken anything for pain. C/o nausea. Denies vomiting. Denies any fevers. Describes as sharp pain that comes and goes and a dull pain that is constant. Pain worse with movement or deep breath. Denies hx of gallbladder issues.

## 2020-02-20 NOTE — ED Notes (Signed)
Pt remains NPO at this time.

## 2020-02-20 NOTE — Care Plan (Addendum)
40 yo F with BMI 31, HTN p/w epigastric pain radiating to flanks after eating.  In the ER, AST/ALT >1000, Tbili normal, lipase 200s, alk phos only 148.  RUQ US shows innumberable stones, no evidence of cholecystitis, CBD normal. WBC normal.  Symptoms resolved with morphine.  No alcohol or acetaminophen at all.     Admit to med surg bed.  For now, supportive care, IV fluids, antiemetics, analgesics.  No further antibiotics needed.    Addendum: Spoke to GI on call for this campus, [facility], Dr. Michail Sermon.  Agrees with supportive care for likely gallstone pancreatitis.  MRCP would add information, should be ordered on arrival.  If pt arrives to [facility] before 5pm, please reach out to Dr. Michail Sermon to see today.  If arrives after 5pm, she is on list to be seen tomorrow. If arrives to [facility], will need to consult [facility].

## 2020-02-20 NOTE — ED Provider Notes (Addendum)
[facility] EMERGENCY DEPARTMENT Provider Note   CSN: [id number] Arrival date & time: 02/20/20  0417     History Chief Complaint  Patient presents with  . Abdominal Pain    [name] [name] is a 40 y.o. female.  HPI     This a 40 year old female with history of diabetes, hypertension, obesity who presents with abdominal pain.  Patient reports onset of abdominal pain last night.  She states that initially was epigastric in nature but now it radiates to the bilateral flanks.  She ate chicken and biscuits for dinner.  This is not necessarily abnormal for her.  She rates her pain 8 out of 10.  She has not taken anything for pain.  She reports nausea without vomiting or diarrhea.  Denies urinary symptoms.  No fevers or recent illnesses.  Patient still has her gallbladder and her appendix.  Past Medical History:  Diagnosis Date  . Anxiety   . Depression   . Diabetes mellitus 05/2008   DR. BALAN  . GERD (gastroesophageal reflux disease)    after taking antibiotics it went away  . Hypertension   . Obesity     Patient Active Problem List   Diagnosis Date Noted  . Depression, recurrent (HCC) 04/04/2019  . Seasonal allergies 04/04/2019  . Vitamin B12 deficiency 07/21/2017  . Iron deficiency 07/21/2017  . Vitamin D deficiency 07/21/2017  . Iron deficiency anemia 06/29/2017  . Gastric bypass status for obesity 02/23/2013    Past Surgical History:  Procedure Laterality Date  . BREATH TEK H PYLORI N/A 08/20/2012   Procedure: BREATH TEK H PYLORI;  Surgeon: Lodema Pilot, DO;  Location: [facility];  Service: Endoscopy;  Laterality: N/A;  . GASTRIC ROUX-EN-Y N/A 08/24/2012   Procedure: LAPAROSCOPIC ROUX-EN-Y GASTRIC BYPASS WITH UPPER ENDOSCOPY;  Surgeon: Lodema Pilot, DO;  Location: [facility];  Service: General;  Laterality: N/A;     OB History    Gravida  0   Para  0   Term  0   Preterm  0   AB  0   Living  0     SAB  0   IAB  0   Ectopic  0    Multiple  0   Live Births              Family History  Problem Relation Age of Onset  . Diabetes Mother   . Hypertension Mother   . Miscarriages / Rielyn Mother   . Diabetes Maternal Aunt   . Diabetes Maternal Grandfather   . Dementia Maternal Grandmother   . Diabetes Maternal Grandmother   . Mental illness Brother     Social History   Tobacco Use  . Smoking status: Never Smoker  . Smokeless tobacco: Never Used  Vaping Use  . Vaping Use: Never used  Substance Use Topics  . Alcohol use: No    Alcohol/week: 0.0 standard drinks  . Drug use: No    Home Medications Prior to Admission medications   Medication Sig Start Date End Date Taking? Authorizing Provider  ARIPiprazole (ABILIFY) 10 MG tablet Take 10 mg by mouth daily.    [provider]  cetirizine (ZYRTEC) 10 MG tablet TAKE 1 TABLET BY MOUTH EVERY DAY 04/01/19   Deeann Saint, MD  Cyanocobalamin (B-12) 1000 MCG SUBL Place 2,000 mcg under the tongue daily. 07/05/17   Johney Maine, MD  FERREX 150 150 MG capsule TAKE 1 CAPSULE BY MOUTH EVERY DAY 07/28/18  Deeann Saint, MD  folic acid (FOLVITE) 800 MCG tablet Take 800 mcg by mouth daily.    [provider]  hydrocortisone valerate ointment (WESTCORT) 0.2 % Apply 1 application topically 2 (two) times daily. 08/03/19   Deeann Saint, MD  hyoscyamine (LEVSIN SL) 0.125 MG SL tablet Place 1 tablet (0.125 mg total) under the tongue every 6 (six) hours as needed. 04/08/18   Sherrilyn Rist, MD  predniSONE (STERAPRED UNI-PAK 21 TAB) 10 MG (21) TBPK tablet Take as directed 11/19/18   Asencion Islam, DPM  sertraline (ZOLOFT) 50 MG tablet TAKE 1 AND 1/2 TABLETS BY MOUTH DAILY 01/16/20   Deeann Saint, MD  Vitamin D, Ergocalciferol, (DRISDOL) 1.25 MG (50000 UT) CAPS capsule TAKE ONE CAPSULE BY MOUTH TWICE WEEKLY 10/31/18   Johney Maine, MD    Allergies    Metformin and related  Review of Systems   Review of Systems   Constitutional: Negative for fever.  Respiratory: Negative for shortness of breath.   Cardiovascular: Negative for chest pain.  Gastrointestinal: Positive for abdominal pain and nausea. Negative for diarrhea and vomiting.  Genitourinary: Negative for dysuria.  All other systems reviewed and are negative.   Physical Exam Updated Vital Signs BP 119/85 (BP Location: Right Arm)   Pulse 70   Temp 98.1 F (36.7 C) (Oral)   Resp 12   Ht 1.778 m (5\' 10" )   Wt 98.4 kg   LMP 02/07/2020   SpO2 99%   BMI 31.14 kg/m   Physical Exam Vitals and nursing note reviewed.  Constitutional:      Appearance: She is well-developed and well-nourished. She is obese. She is not ill-appearing.  HENT:     Head: Normocephalic and atraumatic.  Eyes:     Pupils: Pupils are equal, round, and reactive to light.  Cardiovascular:     Rate and Rhythm: Normal rate and regular rhythm.     Heart sounds: Normal heart sounds.  Pulmonary:     Effort: Pulmonary effort is normal. No respiratory distress.     Breath sounds: No wheezing.  Abdominal:     General: Bowel sounds are normal.     Palpations: Abdomen is soft.     Tenderness: There is abdominal tenderness in the right upper quadrant and epigastric area. There is no guarding or rebound. Positive signs include Murphy's sign.  Musculoskeletal:     Cervical back: Neck supple.  Skin:    General: Skin is warm and dry.  Neurological:     Mental Status: She is alert and oriented to person, place, and time.  Psychiatric:        Mood and Affect: Mood and affect and mood normal.     ED Results / Procedures / Treatments   Labs (all labs ordered are listed, but only abnormal results are displayed) Labs Reviewed  CBC WITH DIFFERENTIAL/PLATELET - Abnormal; Notable for the following components:      Result Value   MCH 25.8 (*)    All other components within normal limits  COMPREHENSIVE METABOLIC PANEL - Abnormal; Notable for the following components:    Glucose, Bld 159 (*)    Calcium 8.6 (*)    AST 2,704 (*)    ALT 1,075 (*)    Alkaline Phosphatase 148 (*)    All other components within normal limits  LIPASE, BLOOD - Abnormal; Notable for the following components:   Lipase 291 (*)    All other components within normal limits  SARS  CORONAVIRUS 2 BY RT PCR (HOSPITAL ORDER, PERFORMED IN [facility] LAB)  URINALYSIS, ROUTINE W REFLEX MICROSCOPIC  PREGNANCY, URINE  ACETAMINOPHEN LEVEL  HEPATITIS PANEL, ACUTE    EKG EKG Interpretation  Date/Time:  Monday February 20 2020 05:52:45 EST Ventricular Rate:  85 PR Interval:    QRS Duration: 96 QT Interval:  392 QTC Calculation: 467 R Axis:   -17 Text Interpretation: Sinus rhythm Multiform ventricular premature complexes Borderline left axis deviation Low voltage, precordial leads Confirmed by Ross Marcus ([id number]) on 02/20/2020 6:23:01 AM   Radiology US Abdomen Limited RUQ (LIVER/GB)  Result Date: 02/20/2020 CLINICAL DATA:  Epigastric pain for 1 day with radiation to the right shoulder EXAM: ULTRASOUND ABDOMEN LIMITED RIGHT UPPER QUADRANT COMPARISON:  None similar FINDINGS: Gallbladder: Innumerable shadowing gallstones with measured stone at 7 mm. The gallbladder appears fall but there is no wall thickening, pericholecystic edema, or focal tenderness. Common bile duct: Diameter: 4 mm Liver: No focal lesion identified. Within normal limits in parenchymal echogenicity. Portal vein is patent on color Doppler imaging with normal direction of blood flow towards the liver. IMPRESSION: Numerous gallstones.  No evidence of acute cholecystitis. Electronically Signed   By: Marnee Spring M.D.   On: 02/20/2020 06:35    Procedures Procedures    Medications Ordered in ED Medications  morphine 4 MG/ML injection 4 mg (has no administration in time range)  0.9 %  sodium chloride infusion (has no administration in time range)  ondansetron (ZOFRAN) injection 4 mg (has no administration in  time range)  morphine 4 MG/ML injection 4 mg (4 mg Intravenous Given 02/20/20 0502)  ondansetron (ZOFRAN) injection 4 mg (4 mg Intravenous Given 02/20/20 0502)  cefTRIAXone (ROCEPHIN) 1 g in sodium chloride 0.9 % 100 mL IVPB (1 g Intravenous New Bag/Given 02/20/20 0701)    ED Course  I have reviewed the triage vital signs and the nursing notes.  Pertinent labs & imaging results that were available during my care of the patient were reviewed by me and considered in my medical decision making (see chart for details).  Clinical Course as of 02/20/20 0736  Mon Feb 20, 2020  0628 Significantly elevated LFTs.  Patient on recheck states she feels much better after pain medication.  Continues to deny any alcohol use, Tylenol overdose.  Will obtain right upper quadrant ultrasound to rule out gallbladder pathology [CH]  0650 Right upper quadrant ultrasound with significant cholelithiasis.  No evidence of gallbladder wall thickening or pericholecystic fluid.  Patient is pain free and remains pain-free.  Question whether she may have had choledocholithiasis which has passed causing some LFT derangements and gallstone pancreatitis.  Regardless, will need admission for trending of LFTs.  May need both general surgery and/or GI evaluation.  We will keep her n.p.o.  She was given IV Rocephin. [CH]  9485 Spoke with Ival Bible, hospitalist. We will plan for admission. Requested orders for as needed pain and nausea medication as well as fluids. We will hold off on additional antibiotics. He will consult GI. [CH]    Clinical Course User Index [CH] Axell Trigueros, Mayer Masker, MD   MDM Rules/Calculators/A&P                          Patient presents with abdominal pain.  Mostly epigastric and right upper quadrant.  Ongoing since last night.  She is nontoxic-appearing but uncomfortable.  Vital signs are reassuring.  She is afebrile.  She has tenderness on exam.  Considerations include but not limited to, pancreatitis,  cholecystitis, reflux, less likely appendicitis.  Labs obtained and patient given pain and nausea medication.  She was made NPO.  Labs are notable for significantly elevated AST and ALT with an AST of 2704 and an ALT of 1075.  Additionally, lipase is elevated at 291.  Patient adamantly denies alcohol or Tylenol use.  Tylenol level and hepatitis panel sent.  Right upper quadrant ultrasound obtained to rule out gallbladder pathology.  She has evidence of cholelithiasis.  No evidence of cholecystitis without pericholecystic fluid or gallbladder wall thickening.  On recheck, she is pain-free.  Question whether she may have had a common bile duct stone that has passed causing elevated LFTs and gallstone pancreatitis.  Other tests are pending at this time.  She is not in shock and doubt shock liver.  Patient was given Rocephin to cover for gallbladder pathology.  We will plan for admission to the hospital.  Given that she does not have radiologic evidence of acute cholecystitis, will hold off on general surgery evaluation.  We will plan for admission to the hospitalist.  She may need an ERCP and/or further testing.  She will remain n.p.o.  Covid testing is pending. Final Clinical Impression(s) / ED Diagnoses Final diagnoses:  Abdominal pain  Gallstone pancreatitis  Calculus of gallbladder with biliary obstruction but without cholecystitis    Rx / DC Orders ED Discharge Orders    None       Evelean Bigler, Mayer Masker, MD 02/20/20 1829    Shon Baton, MD 02/20/20 [phone]

## 2020-02-20 NOTE — Progress Notes (Signed)
Patient arrived back to unit from imaging.

## 2020-02-20 NOTE — ED Provider Notes (Signed)
Signout from Dr. Wilkie Aye.  40 year old female complained of epigastric abdominal pain radiating through to her flanks.  Patient has significant LFT elevation.  Right upper quadrant ultrasound showing gallstones but no evidence of acute cholecystitis.  Case discussed with [facility] hospitalist Dr. Maryfrances Bunnell who placed the patient in for admission.  She has pain medicine and antibiotics written for along with IV fluids. Physical Exam  BP 101/71 (BP Location: Left Arm)   Pulse 80   Temp 98.1 F (36.7 C) (Oral)   Resp (!) 22   Ht 5\' 10"  (1.778 m)   Wt 98.4 kg   LMP 02/07/2020   SpO2 98%   BMI 31.14 kg/m   Physical Exam  ED Course/Procedures   Clinical Course as of 02/20/20 1157  Mon Feb 20, 2020  0628 Significantly elevated LFTs.  Patient on recheck states she feels much better after pain medication.  Continues to deny any alcohol use, Tylenol overdose.  Will obtain right upper quadrant ultrasound to rule out gallbladder pathology [CH]  0650 Right upper quadrant ultrasound with significant cholelithiasis.  No evidence of gallbladder wall thickening or pericholecystic fluid.  Patient is pain free and remains pain-free.  Question whether she may have had choledocholithiasis which has passed causing some LFT derangements and gallstone pancreatitis.  Regardless, will need admission for trending of LFTs.  May need both general surgery and/or GI evaluation.  We will keep her n.p.o.  She was given IV Rocephin. [CH]  Feb 22, 2020 Spoke with [id number], hospitalist. We will plan for admission. Requested orders for as needed pain and nausea medication as well as fluids. We will hold off on additional antibiotics. He will consult GI. [CH]    Clinical Course User Index [CH] Horton, Ival Bible, MD    Procedures  MDM        Mayer Masker, MD 02/20/20 1800

## 2020-02-20 NOTE — H&P (Signed)
History and Physical    [name] [name] VSA:[id number] DOB: 05/26/1980 DOA: 02/20/2020  PCP: Deeann Saint, MD  Patient coming from: Home  Chief Complaint: Abdominal pain   HPI: [name] [name] is a 40 y.o. female with medical history significant of depression, anxiety, diabetes, GERD, HTN, obesity who presents with severe sharp epigastric pain radiating bilateral sides to her back.  This started suddenly last night.  She does admit to a similar episode which was not severe or lasting as long couple of years ago.  That resolved spontaneously.  During this episode, pain was so bad that patient had to seek care in the emergency department.  She denies any fevers, chills, diaphoresis, chest pain, shortness of breath.  No vomiting.  Currently, her pain has resolved after receiving morphine in the emergency department.  ED Course: AST/ALT >1000, Tbili normal, lipase 200s, alk phos only 148.  RUQ US shows innumberable stones, no evidence of cholecystitis, CBD normal. WBC normal.  Symptoms resolved with morphine. GI consulted.   Review of Systems: As per HPI. Otherwise, all other review of systems reviewed and are negative.   Past Medical History:  Diagnosis Date  . Anxiety   . Depression   . Diabetes mellitus 05/2008   DR. BALAN  . GERD (gastroesophageal reflux disease)    after taking antibiotics it went away  . Hypertension   . Obesity     Past Surgical History:  Procedure Laterality Date  . BREATH TEK H PYLORI N/A 08/20/2012   Procedure: BREATH TEK H PYLORI;  Surgeon: Lodema Pilot, DO;  Location: [facility];  Service: Endoscopy;  Laterality: N/A;  . GASTRIC ROUX-EN-Y N/A 08/24/2012   Procedure: LAPAROSCOPIC ROUX-EN-Y GASTRIC BYPASS WITH UPPER ENDOSCOPY;  Surgeon: Lodema Pilot, DO;  Location: [facility];  Service: General;  Laterality: N/A;     reports that she has never smoked. She has never used smokeless tobacco. She reports that she does not drink alcohol and does not  use drugs.  Allergies  Allergen Reactions  . Metformin And Related Itching    Family History  Problem Relation Age of Onset  . Diabetes Mother   . Hypertension Mother   . Miscarriages / Anisa Mother   . Diabetes Maternal Aunt   . Diabetes Maternal Grandfather   . Dementia Maternal Grandmother   . Diabetes Maternal Grandmother   . Mental illness Brother      Prior to Admission medications   Medication Sig Start Date End Date Taking? Authorizing Provider  ARIPiprazole (ABILIFY) 10 MG tablet Take 10 mg by mouth daily.    [provider]  cetirizine (ZYRTEC) 10 MG tablet TAKE 1 TABLET BY MOUTH EVERY DAY 04/01/19   Deeann Saint, MD  Cyanocobalamin (B-12) 1000 MCG SUBL Place 2,000 mcg under the tongue daily. 07/05/17   Johney Maine, MD  FERREX 150 150 MG capsule TAKE 1 CAPSULE BY MOUTH EVERY DAY 07/28/18   Deeann Saint, MD  folic acid (FOLVITE) 800 MCG tablet Take 800 mcg by mouth daily.    [provider]  hydrocortisone valerate ointment (WESTCORT) 0.2 % Apply 1 application topically 2 (two) times daily. 08/03/19   Deeann Saint, MD  hyoscyamine (LEVSIN SL) 0.125 MG SL tablet Place 1 tablet (0.125 mg total) under the tongue every 6 (six) hours as needed. 04/08/18   Sherrilyn Rist, MD  predniSONE (STERAPRED UNI-PAK 21 TAB) 10 MG (21) TBPK tablet Take as directed 11/19/18   Asencion Islam, DPM  sertraline (ZOLOFT) 50 MG tablet TAKE 1 AND 1/2 TABLETS BY MOUTH DAILY 01/16/20   Billie Ruddy, MD  Vitamin D, Ergocalciferol, (DRISDOL) 1.25 MG (50000 UT) CAPS capsule TAKE ONE CAPSULE BY MOUTH TWICE WEEKLY 10/31/18   Brunetta Genera, MD    Physical Exam: Vitals:   02/20/20 0930 02/20/20 1000 02/20/20 1336 02/20/20 1616  BP: 101/71 110/63 118/67 (!) 125/94  Pulse: 80 64 76 68  Resp: (!) 22 14 16 16   Temp:   98 F (36.7 C) 98.2 F (36.8 C)  TempSrc:   Oral Oral  SpO2: 98% 99% 99% 99%  Weight:      Height:         Constitutional:  NAD, calm, comfortable Eyes: PERRL, lids and conjunctivae normal ENMT: Deferred, patient wearing a facemask Respiratory: Clear to auscultation bilaterally, no wheezing, no crackles. Normal respiratory effort. No accessory muscle use. No conversational dyspnea  Cardiovascular: Regular rate and rhythm, no murmurs. No extremity edema.  Abdomen: Soft, nondistended, nontender to palpation. Bowel sounds positive.  Musculoskeletal: No joint deformity upper and lower extremities. No contractures. Normal muscle tone.  Skin: no rashes, lesions, ulcers on exposed skin  Neurologic: Alert and oriented, speech fluent, CN 2-12 grossly intact. No focal deficits.   Psychiatric: Normal judgment and insight. Normal mood and affect   Labs on Admission: I have personally reviewed following labs and imaging studies  CBC: Recent Labs  Lab 02/20/20 0434  WBC 4.8  NEUTROABS 3.4  HGB 12.3  HCT 39.4  MCV 82.6  PLT 734   Basic Metabolic Panel: Recent Labs  Lab 02/20/20 0434  NA 136  K 4.0  CL 100  CO2 26  GLUCOSE 159*  BUN 11  CREATININE 0.77  CALCIUM 8.6*   GFR: Estimated Creatinine Clearance: 118.8 mL/min (by C-G formula based on SCr of 0.77 mg/dL). Liver Function Tests: Recent Labs  Lab 02/20/20 0434  AST 2,704*  ALT 1,075*  ALKPHOS 148*  BILITOT 1.1  PROT 7.6  ALBUMIN 3.8   Recent Labs  Lab 02/20/20 0434  LIPASE 291*   No results for input(s): AMMONIA in the last 168 hours. Coagulation Profile: No results for input(s): INR, PROTIME in the last 168 hours. Cardiac Enzymes: No results for input(s): CKTOTAL, CKMB, CKMBINDEX, TROPONINI in the last 168 hours. BNP (last 3 results) No results for input(s): PROBNP in the last 8760 hours. HbA1C: No results for input(s): HGBA1C in the last 72 hours. CBG: No results for input(s): GLUCAP in the last 168 hours. Lipid Profile: No results for input(s): CHOL, HDL, LDLCALC, TRIG, CHOLHDL, LDLDIRECT in the last 72 hours. Thyroid Function  Tests: No results for input(s): TSH, T4TOTAL, FREET4, T3FREE, THYROIDAB in the last 72 hours. Anemia Panel: No results for input(s): VITAMINB12, FOLATE, FERRITIN, TIBC, IRON, RETICCTPCT in the last 72 hours. Urine analysis:    Component Value Date/Time   COLORURINE YELLOW 02/20/2020 0422   APPEARANCEUR CLEAR 02/20/2020 0422   LABSPEC 1.010 02/20/2020 0422   PHURINE 7.5 02/20/2020 0422   GLUCOSEU NEGATIVE 02/20/2020 0422   HGBUR NEGATIVE 02/20/2020 0422   BILIRUBINUR NEGATIVE 02/20/2020 0422   KETONESUR NEGATIVE 02/20/2020 0422   PROTEINUR NEGATIVE 02/20/2020 0422   UROBILINOGEN 0.2 02/15/2014 1608   NITRITE NEGATIVE 02/20/2020 0422   LEUKOCYTESUR NEGATIVE 02/20/2020 0422   Sepsis Labs: !!!!!!!!!!!!!!!!!!!!!!!!!!!!!!!!!!!!!!!!!!!! @LABRCNTIP (procalcitonin:4,lacticidven:4) ) Recent Results (from the past 240 hour(s))  SARS Coronavirus 2 by RT PCR (hospital order, performed in [facility] hospital lab) Nasopharyngeal Nasopharyngeal Swab  Status: None   Collection Time: 02/20/20  6:55 AM   Specimen: Nasopharyngeal Swab  Result Value Ref Range Status   SARS Coronavirus 2 NEGATIVE NEGATIVE Final    Comment: (NOTE) SARS-CoV-2 target nucleic acids are NOT DETECTED.  The SARS-CoV-2 RNA is generally detectable in upper and lower respiratory specimens during the acute phase of infection. The lowest concentration of SARS-CoV-2 viral copies this assay can detect is 250 copies / mL. A negative result does not preclude SARS-CoV-2 infection and should not be used as the sole basis for treatment or other patient management decisions.  A negative result may occur with improper specimen collection / handling, submission of specimen other than nasopharyngeal swab, presence of viral mutation(s) within the areas targeted by this assay, and inadequate number of viral copies (<250 copies / mL). A negative result must be combined with clinical observations, patient history, and epidemiological  information.  Fact Sheet for Patients:   StrictlyIdeas.no  Fact Sheet for Healthcare Providers: BankingDealers.co.za  This test is not yet approved or  cleared by the [place] FDA and has been authorized for detection and/or diagnosis of SARS-CoV-2 by FDA under an Emergency Use Authorization (EUA).  This EUA will remain in effect (meaning this test can be used) for the duration of the COVID-19 declaration under Section 564(b)(1) of the Act, 21 U.S.C. section 360bbb-3(b)(1), unless the authorization is terminated or revoked sooner.  Performed at [facility], [address] [place], [place] [address]      Radiological Exams on Admission: US Abdomen Limited RUQ (LIVER/GB)  Result Date: 02/20/2020 CLINICAL DATA:  Epigastric pain for 1 day with radiation to the right shoulder EXAM: ULTRASOUND ABDOMEN LIMITED RIGHT UPPER QUADRANT COMPARISON:  None similar FINDINGS: Gallbladder: Innumerable shadowing gallstones with measured stone at 7 mm. The gallbladder appears fall but there is no wall thickening, pericholecystic edema, or focal tenderness. Common bile duct: Diameter: 4 mm Liver: No focal lesion identified. Within normal limits in parenchymal echogenicity. Portal vein is patent on color Doppler imaging with normal direction of blood flow towards the liver. IMPRESSION: Numerous gallstones.  No evidence of acute cholecystitis. Electronically Signed   By: Monte Fantasia M.D.   On: 02/20/2020 06:35    EKG: Independently reviewed. NSR with PVC   Assessment/Plan Principal Problem:   Gallstone pancreatitis Active Problems:   Depression, recurrent (HCC)   Hepatitis   Gallstone pancreatitis  -Lipase 291 -RUQ [place] with numerous gallstones, without cholecystitis -Supportive care, IVF, pain med, antiemetic, NPO  -MRCP ordered  -Discussed with [facility] for consult in AM   Hepatitis  -Due to above -Hepatitis panel negative,  acetaminophen < 10 -Trend LFT  -IVF   Depression -Continue Zoloft     DVT prophylaxis: Lovenox enoxaparin (LOVENOX) injection 40 mg Start: 02/20/20 1800  Code Status: Full  Family Communication: Husband on speaker phone during exam  Disposition Plan: None Consults called: GI   Status is: Observation  The patient remains OBS appropriate and will d/c before 2 midnights.  Dispo: The patient is from: Home              Anticipated d/c is to: Home              Anticipated d/c date is: 1 day              Patient currently is not medically stable to d/c.   Difficult to place patient No   Severity of Illness: The appropriate patient status for  this patient is OBSERVATION. Observation status is judged to be reasonable and necessary in order to provide the required intensity of service to ensure the patient's safety. The patient's presenting symptoms, physical exam findings, and initial radiographic and laboratory data in the context of their medical condition is felt to place them at decreased risk for further clinical deterioration. Furthermore, it is anticipated that the patient will be medically stable for discharge from the hospital within 2 midnights of admission.    Dessa Phi, DO [facility] 02/20/2020, 5:03 PM   Available via Epic secure chat 7am-7pm After these hours, please refer to coverage provider listed on [email or web address]

## 2020-02-20 NOTE — ED Notes (Signed)
CARELINK AT BEDSIDE 

## 2020-02-20 NOTE — ED Notes (Signed)
This RN noted pt heart rhythm was irregular on 5 lead cardiac monitor; EDP informed, ordered an EKG verbally

## 2020-02-20 NOTE — ED Notes (Signed)
Pt to US.

## 2020-02-21 ENCOUNTER — Encounter (HOSPITAL_COMMUNITY): Payer: Self-pay | Admitting: Family Medicine

## 2020-02-21 DIAGNOSIS — R1013 Epigastric pain: Secondary | ICD-10-CM

## 2020-02-21 DIAGNOSIS — K851 Biliary acute pancreatitis without necrosis or infection: Principal | ICD-10-CM

## 2020-02-21 DIAGNOSIS — I10 Essential (primary) hypertension: Secondary | ICD-10-CM | POA: Diagnosis present

## 2020-02-21 DIAGNOSIS — F32A Depression, unspecified: Secondary | ICD-10-CM | POA: Diagnosis present

## 2020-02-21 DIAGNOSIS — Z79899 Other long term (current) drug therapy: Secondary | ICD-10-CM | POA: Diagnosis not present

## 2020-02-21 DIAGNOSIS — Z818 Family history of other mental and behavioral disorders: Secondary | ICD-10-CM | POA: Diagnosis not present

## 2020-02-21 DIAGNOSIS — K759 Inflammatory liver disease, unspecified: Secondary | ICD-10-CM | POA: Diagnosis present

## 2020-02-21 DIAGNOSIS — Z9884 Bariatric surgery status: Secondary | ICD-10-CM | POA: Diagnosis not present

## 2020-02-21 DIAGNOSIS — E119 Type 2 diabetes mellitus without complications: Secondary | ICD-10-CM | POA: Diagnosis present

## 2020-02-21 DIAGNOSIS — R7401 Elevation of levels of liver transaminase levels: Secondary | ICD-10-CM | POA: Diagnosis not present

## 2020-02-21 DIAGNOSIS — K8021 Calculus of gallbladder without cholecystitis with obstruction: Secondary | ICD-10-CM | POA: Diagnosis present

## 2020-02-21 DIAGNOSIS — Z833 Family history of diabetes mellitus: Secondary | ICD-10-CM | POA: Diagnosis not present

## 2020-02-21 DIAGNOSIS — E669 Obesity, unspecified: Secondary | ICD-10-CM | POA: Diagnosis present

## 2020-02-21 DIAGNOSIS — K219 Gastro-esophageal reflux disease without esophagitis: Secondary | ICD-10-CM | POA: Diagnosis present

## 2020-02-21 DIAGNOSIS — Z20822 Contact with and (suspected) exposure to covid-19: Secondary | ICD-10-CM | POA: Diagnosis present

## 2020-02-21 DIAGNOSIS — Z6831 Body mass index (BMI) 31.0-31.9, adult: Secondary | ICD-10-CM | POA: Diagnosis not present

## 2020-02-21 DIAGNOSIS — R7989 Other specified abnormal findings of blood chemistry: Secondary | ICD-10-CM | POA: Diagnosis present

## 2020-02-21 DIAGNOSIS — R748 Abnormal levels of other serum enzymes: Secondary | ICD-10-CM | POA: Diagnosis present

## 2020-02-21 DIAGNOSIS — Z888 Allergy status to other drugs, medicaments and biological substances status: Secondary | ICD-10-CM | POA: Diagnosis not present

## 2020-02-21 DIAGNOSIS — Z8249 Family history of ischemic heart disease and other diseases of the circulatory system: Secondary | ICD-10-CM | POA: Diagnosis not present

## 2020-02-21 DIAGNOSIS — D509 Iron deficiency anemia, unspecified: Secondary | ICD-10-CM | POA: Diagnosis present

## 2020-02-21 DIAGNOSIS — F419 Anxiety disorder, unspecified: Secondary | ICD-10-CM | POA: Diagnosis present

## 2020-02-21 DIAGNOSIS — K529 Noninfective gastroenteritis and colitis, unspecified: Secondary | ICD-10-CM | POA: Diagnosis present

## 2020-02-21 LAB — HEPATIC FUNCTION PANEL
ALT: 660 U/L — ABNORMAL HIGH (ref 0–44)
AST: 598 U/L — ABNORMAL HIGH (ref 15–41)
Albumin: 2.8 g/dL — ABNORMAL LOW (ref 3.5–5.0)
Alkaline Phosphatase: 147 U/L — ABNORMAL HIGH (ref 38–126)
Bilirubin, Direct: 0.1 mg/dL (ref 0.0–0.2)
Indirect Bilirubin: 0.6 mg/dL (ref 0.3–0.9)
Total Bilirubin: 0.7 mg/dL (ref 0.3–1.2)
Total Protein: 6 g/dL — ABNORMAL LOW (ref 6.5–8.1)

## 2020-02-21 LAB — CBC
HCT: 34.8 % — ABNORMAL LOW (ref 36.0–46.0)
Hemoglobin: 10.8 g/dL — ABNORMAL LOW (ref 12.0–15.0)
MCH: 26.3 pg (ref 26.0–34.0)
MCHC: 31 g/dL (ref 30.0–36.0)
MCV: 84.9 fL (ref 80.0–100.0)
Platelets: 280 10*3/uL (ref 150–400)
RBC: 4.1 MIL/uL (ref 3.87–5.11)
RDW: 13.6 % (ref 11.5–15.5)
WBC: 5.3 10*3/uL (ref 4.0–10.5)
nRBC: 0 % (ref 0.0–0.2)

## 2020-02-21 LAB — BASIC METABOLIC PANEL
Anion gap: 7 (ref 5–15)
BUN: 8 mg/dL (ref 6–20)
CO2: 26 mmol/L (ref 22–32)
Calcium: 8.2 mg/dL — ABNORMAL LOW (ref 8.9–10.3)
Chloride: 108 mmol/L (ref 98–111)
Creatinine, Ser: 0.8 mg/dL (ref 0.44–1.00)
GFR, Estimated: >60 mL/min (ref >60)
Glucose, Bld: 95 mg/dL (ref 70–99)
Potassium: 3.9 mmol/L (ref 3.5–5.1)
Sodium: 141 mmol/L (ref 135–145)

## 2020-02-21 LAB — HIV ANTIBODY (ROUTINE TESTING W REFLEX): HIV Screen 4th Generation wRfx: NONREACTIVE

## 2020-02-21 MED ORDER — SODIUM CHLORIDE 0.9 % IV SOLN
2.0000 g | INTRAVENOUS | Status: AC
  Administered 2020-02-22: 2 g via INTRAVENOUS
  Filled 2020-02-21: qty 20
  Filled 2020-02-21: qty 2

## 2020-02-21 MED ORDER — MORPHINE SULFATE (PF) 2 MG/ML IV SOLN
1.0000 mg | INTRAVENOUS | Status: DC | PRN
  Administered 2020-02-22 – 2020-02-23 (×3): 2 mg via INTRAVENOUS
  Filled 2020-02-21 (×3): qty 1

## 2020-02-21 NOTE — Progress Notes (Signed)
PROGRESS NOTE    [name] [name]  WLS:[id number] DOB: [birth year] DOA: 02/20/2020 PCP: Deeann Saint, MD     Brief Narrative:  [name] Shterna Laramee is a 40 y.o. female with medical history significant of depression, anxiety, diabetes, GERD, HTN, obesity who presents with severe sharp epigastric pain radiating bilateral sides to her back.  This started suddenly last night.  She does admit to a similar episode which was not severe or lasting as long couple of years ago.  That resolved spontaneously.  During this episode, pain was so bad that patient had to seek care in the emergency department.  She denies any fevers, chills, diaphoresis, chest pain, shortness of breath.  No vomiting.  Currently, her pain has resolved after receiving morphine in the emergency department.  She was admitted for gallstone pancreatitis with elevated liver enzymes.  MRCP showed cholelithiasis without biliary ductal dilatation.  New events last 24 hours / Subjective: No further abdominal pain, nausea or vomiting.  Asking for food.  Assessment & Plan:   Principal Problem:   Gallstone pancreatitis Active Problems:   Depression, recurrent (HCC)   Hepatitis   Gallstone pancreatitis  -Lipase 291 -RUQ [place] with numerous gallstones, without cholecystitis -MRCP with cholelithiasis without biliary dilatation -Improving -GI, general surgery consulted  Hepatitis  -Due to above -Hepatitis panel negative, acetaminophen < 10 -Improving  Depression -Continue Zoloft    DVT prophylaxis:  enoxaparin (LOVENOX) injection 40 mg Start: 02/20/20 2000  Code Status: Full code Family Communication: No family at bedside Disposition Plan:  Status is: Observation  The patient will require care spanning > 2 midnights and should be moved to inpatient because: Inpatient level of care appropriate due to severity of illness  Dispo: The patient is from: Home              Anticipated d/c is to: Home               Anticipated d/c date is: 1 day              Patient currently is not medically stable to d/c.  Awaiting further recommendation from GI and general surgery   Difficult to place patient No      Consultants:   GI  General surgery   Antimicrobials:  Anti-infectives (From admission, onward)   Start     Dose/Rate Route Frequency Ordered Stop   02/20/20 0700  cefTRIAXone (ROCEPHIN) 1 g in sodium chloride 0.9 % 100 mL IVPB        1 g 200 mL/hr over 30 Minutes Intravenous  Once 02/20/20 0651 02/20/20 0747        Objective: Vitals:   02/20/20 1616 02/20/20 1748 02/20/20 2207 02/21/20 0605  BP: (!) 125/94 (!) 126/94 104/71 114/87  Pulse: 68 80 79 84  Resp: 16 15 16 16   Temp: 98.2 F (36.8 C) 98.5 F (36.9 C) 98.1 F (36.7 C) 97.7 F (36.5 C)  TempSrc: Oral Oral Oral Oral  SpO2: 99% 99% 100% 100%  Weight:      Height:        Intake/Output Summary (Last 24 hours) at 02/21/2020 1244 Last data filed at 02/21/2020 1149 Gross per 24 hour  Intake 3039.84 ml  Output --  Net 3039.84 ml   Filed Weights   02/20/20 0432  Weight: 98.4 kg    Examination:  General exam: Appears calm and comfortable  Respiratory system: Clear to auscultation. Respiratory effort normal. No respiratory distress. No conversational dyspnea.  Cardiovascular system:  S1 & S2 heard, RRR. No murmurs. No pedal edema. Gastrointestinal system: Abdomen is nondistended, soft and nontender. Normal bowel sounds heard. Central nervous system: Alert and oriented. No focal neurological deficits. Speech clear.  Extremities: Symmetric in appearance  Skin: No rashes, lesions or ulcers on exposed skin  Psychiatry: Judgement and insight appear normal. Mood & affect appropriate.   Data Reviewed: I have personally reviewed following labs and imaging studies  CBC: Recent Labs  Lab 02/20/20 0434 02/21/20 0251  WBC 4.8 5.3  NEUTROABS 3.4  --   HGB 12.3 10.8*  HCT 39.4 34.8*  MCV 82.6 84.9  PLT 326 280   Basic  Metabolic Panel: Recent Labs  Lab 02/20/20 0434 02/21/20 0251  NA 136 141  K 4.0 3.9  CL 100 108  CO2 26 26  GLUCOSE 159* 95  BUN 11 8  CREATININE 0.77 0.80  CALCIUM 8.6* 8.2*   GFR: Estimated Creatinine Clearance: 118.8 mL/min (by C-G formula based on SCr of 0.8 mg/dL). Liver Function Tests: Recent Labs  Lab 02/20/20 0434 02/21/20 0251  AST 2,704* 598*  ALT 1,075* 660*  ALKPHOS 148* 147*  BILITOT 1.1 0.7  PROT 7.6 6.0*  ALBUMIN 3.8 2.8*   Recent Labs  Lab 02/20/20 0434  LIPASE 291*   No results for input(s): AMMONIA in the last 168 hours. Coagulation Profile: No results for input(s): INR, PROTIME in the last 168 hours. Cardiac Enzymes: No results for input(s): CKTOTAL, CKMB, CKMBINDEX, TROPONINI in the last 168 hours. BNP (last 3 results) No results for input(s): PROBNP in the last 8760 hours. HbA1C: No results for input(s): HGBA1C in the last 72 hours. CBG: No results for input(s): GLUCAP in the last 168 hours. Lipid Profile: No results for input(s): CHOL, HDL, LDLCALC, TRIG, CHOLHDL, LDLDIRECT in the last 72 hours. Thyroid Function Tests: No results for input(s): TSH, T4TOTAL, FREET4, T3FREE, THYROIDAB in the last 72 hours. Anemia Panel: No results for input(s): VITAMINB12, FOLATE, FERRITIN, TIBC, IRON, RETICCTPCT in the last 72 hours. Sepsis Labs: No results for input(s): PROCALCITON, LATICACIDVEN in the last 168 hours.  Recent Results (from the past 240 hour(s))  SARS Coronavirus 2 by RT PCR (hospital order, performed in [facility] hospital lab) Nasopharyngeal Nasopharyngeal Swab     Status: None   Collection Time: 02/20/20  6:55 AM   Specimen: Nasopharyngeal Swab  Result Value Ref Range Status   SARS Coronavirus 2 NEGATIVE NEGATIVE Final    Comment: (NOTE) SARS-CoV-2 target nucleic acids are NOT DETECTED.  The SARS-CoV-2 RNA is generally detectable in upper and lower respiratory specimens during the acute phase of infection. The  lowest concentration of SARS-CoV-2 viral copies this assay can detect is 250 copies / mL. A negative result does not preclude SARS-CoV-2 infection and should not be used as the sole basis for treatment or other patient management decisions.  A negative result may occur with improper specimen collection / handling, submission of specimen other than nasopharyngeal swab, presence of viral mutation(s) within the areas targeted by this assay, and inadequate number of viral copies (<250 copies / mL). A negative result must be combined with clinical observations, patient history, and epidemiological information.  Fact Sheet for Patients:   BoilerBrush.com.cy  Fact Sheet for Healthcare Providers: https://pope.com/  This test is not yet approved or  cleared by the [place] FDA and has been authorized for detection and/or diagnosis of SARS-CoV-2 by FDA under an Emergency Use Authorization (EUA).  This EUA will remain in effect (meaning this  test can be used) for the duration of the COVID-19 declaration under Section 564(b)(1) of the Act, 21 U.S.C. section 360bbb-3(b)(1), unless the authorization is terminated or revoked sooner.  Performed at [facility], [address] [place], [place] [address]       Radiology Studies: MR 3D Recon At Scanner  Result Date: 02/20/2020 CLINICAL DATA:  Acute onset upper abdominal pain and nausea today. Elevated liver function tests. Cholelithiasis. EXAM: MRI ABDOMEN WITHOUT AND WITH CONTRAST (INCLUDING MRCP) TECHNIQUE: Multiplanar multisequence MR imaging of the abdomen was performed both before and after the administration of intravenous contrast. Heavily T2-weighted images of the biliary and pancreatic ducts were obtained, and three-dimensional MRCP images were rendered by post processing. CONTRAST:  25mL GADAVIST GADOBUTROL 1 MMOL/ML IV SOLN COMPARISON:  None. FINDINGS: Lower chest: No acute  findings. Hepatobiliary: No hepatic masses identified. Numerous small gallstones are seen nearly filling the gallbladder, however there is no evidence of cholecystitis. No evidence of biliary ductal dilatation. Pancreas: Normal appearance. No mass or inflammatory changes seen. No evidence of pancreatic ductal dilatation or pancreas divisum. Spleen:  Within normal limits in size and appearance. Adrenals/Urinary Tract: No masses identified. No evidence of hydronephrosis. Stomach/Bowel: Visualized portion unremarkable. Vascular/Lymphatic: No pathologically enlarged lymph nodes identified. No abdominal aortic aneurysm. Other:  None. Musculoskeletal:  No suspicious bone lesions identified. IMPRESSION: Cholelithiasis. No radiographic evidence of cholecystitis, biliary ductal dilatation, or other acute findings. Electronically Signed   By: Danae Orleans M.D.   On: 02/20/2020 20:52   MR ABDOMEN MRCP W WO CONTAST  Result Date: 02/20/2020 CLINICAL DATA:  Acute onset upper abdominal pain and nausea today. Elevated liver function tests. Cholelithiasis. EXAM: MRI ABDOMEN WITHOUT AND WITH CONTRAST (INCLUDING MRCP) TECHNIQUE: Multiplanar multisequence MR imaging of the abdomen was performed both before and after the administration of intravenous contrast. Heavily T2-weighted images of the biliary and pancreatic ducts were obtained, and three-dimensional MRCP images were rendered by post processing. CONTRAST:  94mL GADAVIST GADOBUTROL 1 MMOL/ML IV SOLN COMPARISON:  None. FINDINGS: Lower chest: No acute findings. Hepatobiliary: No hepatic masses identified. Numerous small gallstones are seen nearly filling the gallbladder, however there is no evidence of cholecystitis. No evidence of biliary ductal dilatation. Pancreas: Normal appearance. No mass or inflammatory changes seen. No evidence of pancreatic ductal dilatation or pancreas divisum. Spleen:  Within normal limits in size and appearance. Adrenals/Urinary Tract: No masses  identified. No evidence of hydronephrosis. Stomach/Bowel: Visualized portion unremarkable. Vascular/Lymphatic: No pathologically enlarged lymph nodes identified. No abdominal aortic aneurysm. Other:  None. Musculoskeletal:  No suspicious bone lesions identified. IMPRESSION: Cholelithiasis. No radiographic evidence of cholecystitis, biliary ductal dilatation, or other acute findings. Electronically Signed   By: Danae Orleans M.D.   On: 02/20/2020 20:52   US Abdomen Limited RUQ (LIVER/GB)  Result Date: 02/20/2020 CLINICAL DATA:  Epigastric pain for 1 day with radiation to the right shoulder EXAM: ULTRASOUND ABDOMEN LIMITED RIGHT UPPER QUADRANT COMPARISON:  None similar FINDINGS: Gallbladder: Innumerable shadowing gallstones with measured stone at 7 mm. The gallbladder appears fall but there is no wall thickening, pericholecystic edema, or focal tenderness. Common bile duct: Diameter: 4 mm Liver: No focal lesion identified. Within normal limits in parenchymal echogenicity. Portal vein is patent on color Doppler imaging with normal direction of blood flow towards the liver. IMPRESSION: Numerous gallstones.  No evidence of acute cholecystitis. Electronically Signed   By: Marnee Spring M.D.   On: 02/20/2020 06:35      Scheduled Meds: . enoxaparin (LOVENOX)  injection  40 mg Subcutaneous Q24H  . sertraline  75 mg Oral Daily   Continuous Infusions: . sodium chloride 100 mL/hr at 02/21/20 1009     LOS: 0 days      Time spent: 25 minutes   Noralee Stain, DO [facility] 02/21/2020, 12:44 PM   Available via Epic secure chat 7am-7pm After these hours, please refer to coverage provider listed on [email or web address]

## 2020-02-21 NOTE — Consult Note (Addendum)
Referring Provider:  [facility] Primary Care Physician:  Billie Ruddy, MD Primary Gastroenterologist:  Elinor Parkinson primary  Reason for Consultation:  Gallstones and elevated LFT's with epigastric pain  HPI: [name] [name] is a 40 y.o. female with PMH of Roux-en-Y, anxiety and depression, GERD, DM, HTN.  She presented to the ED with complaints of severe sharp epigastric pain radiating bilateral sides to her back.  This started suddenly on Sunday night.  She does admit to a similar episode which was not severe or lasting as long couple of years ago.  That resolved spontaneously but did require and ED visit.  This time pain just kept worsening.  No vomiting.  Total bili has been normal and is 0.7 this AM.  AST was 2704 yesterday and down to 598 today.  ALT was 1075 yesterday and down to 660 today.  ALP was 148 and is 147 today.  Lipase was 291 yesterday.  Acute hepatitis panel negative.  Ultrasound showed numerous gallstones with CBD of 4 mm.  MRCP showed cholelithiasis with no biliary ductal dilitation.  Her pain has resolved and she tolerated breakfast this morning.     Past Medical History:  Diagnosis Date  . Anxiety   . Depression   . Diabetes mellitus 05/2008   DR. BALAN  . GERD (gastroesophageal reflux disease)    after taking antibiotics it went away  . Hypertension   . Obesity     Past Surgical History:  Procedure Laterality Date  . BREATH TEK H PYLORI N/A 08/20/2012   Procedure: BREATH TEK H PYLORI;  Surgeon: Madilyn Hook, DO;  Location: [facility] ENDOSCOPY;  Service: Endoscopy;  Laterality: N/A;  . GASTRIC ROUX-EN-Y N/A 08/24/2012   Procedure: LAPAROSCOPIC ROUX-EN-Y GASTRIC BYPASS WITH UPPER ENDOSCOPY;  Surgeon: Madilyn Hook, DO;  Location: [facility] ORS;  Service: General;  Laterality: N/A;    Prior to Admission medications   Medication Sig Start Date End Date Taking? Authorizing Provider  acetaminophen (TYLENOL) 500 MG tablet Take 1,000 mg by mouth every 6 (six) hours  as needed for moderate pain.   Yes [provider]  cetirizine (ZYRTEC) 10 MG tablet TAKE 1 TABLET BY MOUTH EVERY DAY Patient taking differently: Take 10 mg by mouth daily. 04/01/19  Yes Billie Ruddy, MD  hydrocortisone valerate ointment (WESTCORT) 0.2 % Apply 1 application topically 2 (two) times daily. Patient taking differently: Apply 1 application topically 2 (two) times daily as needed (itching). 08/03/19  Yes Billie Ruddy, MD  sertraline (ZOLOFT) 50 MG tablet TAKE 1 AND 1/2 TABLETS BY MOUTH DAILY Patient taking differently: Take 75 mg by mouth daily. 01/16/20  Yes Billie Ruddy, MD    Current Facility-Administered Medications  Medication Dose Route Frequency Provider Last Rate Last Admin  . 0.9 %  sodium chloride infusion   Intravenous Continuous Dessa Phi, DO 100 mL/hr at 02/21/20 1009 Rate Change at 02/21/20 1009  . enoxaparin (LOVENOX) injection 40 mg  40 mg Subcutaneous Q24H Dessa Phi, DO      . ibuprofen (ADVIL) tablet 400 mg  400 mg Oral Q6H PRN Dessa Phi, DO      . loratadine (CLARITIN) tablet 10 mg  10 mg Oral Daily PRN Dessa Phi, DO   10 mg at 02/20/20 1759  . metoprolol tartrate (LOPRESSOR) injection 5 mg  5 mg Intravenous Q6H PRN Dessa Phi, DO      . morphine 4 MG/ML injection 4 mg  4 mg Intravenous Q2H PRN Horton, Barbette Hair, MD      .  ondansetron (ZOFRAN) injection 4 mg  4 mg Intravenous Q6H PRN Dessa Phi, DO      . oxyCODONE (Oxy IR/ROXICODONE) immediate release tablet 5 mg  5 mg Oral Q4H PRN Dessa Phi, DO      . sertraline (ZOLOFT) tablet 75 mg  75 mg Oral Daily Dessa Phi, DO   75 mg at 02/21/20 0998    Allergies as of 02/20/2020 - Review Complete 02/20/2020  Allergen Reaction Noted  . Metformin and related Itching 09/30/2010    Family History  Problem Relation Age of Onset  . Diabetes Mother   . Hypertension Mother   . Miscarriages / [place] Mother   . Diabetes Maternal Aunt   . Diabetes Maternal  Grandfather   . Dementia Maternal Grandmother   . Diabetes Maternal Grandmother   . Mental illness Brother     Social History   Socioeconomic History  . Marital status: Married    Spouse name: Not on file  . Number of children: 0  . Years of education: Not on file  . Highest education level: Not on file  Occupational History  . Occupation: Educator  Tobacco Use  . Smoking status: Never Smoker  . Smokeless tobacco: Never Used  Vaping Use  . Vaping Use: Never used  Substance and Sexual Activity  . Alcohol use: No    Alcohol/week: 0.0 standard drinks  . Drug use: No  . Sexual activity: Yes    Birth control/protection: Condom    Comment: INTERCOUSRE AGE 58, MORE THAN 5 SEXUAL PARTNERS  Other Topics Concern  . Not on file  Social History Narrative  . Not on file   Social Determinants of Health   Financial Resource Strain: Not on file  Food Insecurity: Not on file  Transportation Needs: Not on file  Physical Activity: Not on file  Stress: Not on file  Social Connections: Not on file  Intimate Partner Violence: Not on file    Review of Systems: ROS is O/W negative except as mentioned in HPI.  Physical Exam: Vital signs in last 24 hours: Temp:  [97.7 F (36.5 C)-98.5 F (36.9 C)] 97.7 F (36.5 C) (02/01 0605) Pulse Rate:  [68-84] 84 (02/01 0605) Resp:  [15-16] 16 (02/01 0605) BP: (104-126)/(67-94) 114/87 (02/01 0605) SpO2:  [99 %-100 %] 100 % (02/01 0605) Last BM Date: 02/21/20 General:   Alert, Well-developed, well-nourished, pleasant and cooperative in NAD Head:  Normocephalic and atraumatic. Eyes:  Sclera clear, no icterus.  Conjunctiva pink. Ears:  Normal auditory acuity. Mouth:  No deformity or lesions.   Lungs:  Clear throughout to auscultation.  No wheezes, crackles, or rhonchi.  Heart:  Regular rate and rhythm; no murmurs, clicks, rubs, or gallops. Abdomen:  Soft, non-distended.  BS present.  Non-tender. Msk:  Symmetrical without gross  deformities. Pulses:  Normal pulses noted. Extremities:  Without clubbing or edema. Neurologic:  Alert and oriented x 4;  grossly normal neurologically. Skin:  Intact without significant lesions or rashes. Psych:  Alert and cooperative. Normal mood and affect.  Intake/Output from previous day: 01/31 0701 - 02/01 0700 In: 2799.8 [I.V.:2699.8; IV Piggyback:100] Out: -  Intake/Output this shift: Total I/O In: 220 [P.O.:220] Out: -   Lab Results: Recent Labs    02/20/20 0434 02/21/20 0251  WBC 4.8 5.3  HGB 12.3 10.8*  HCT 39.4 34.8*  PLT 326 280   BMET Recent Labs    02/20/20 0434 02/21/20 0251  NA 136 141  K 4.0 3.9  CL 100  108  CO2 26 26  GLUCOSE 159* 95  BUN 11 8  CREATININE 0.77 0.80  CALCIUM 8.6* 8.2*   LFT Recent Labs    02/21/20 0251  PROT 6.0*  ALBUMIN 2.8*  AST 598*  ALT 660*  ALKPHOS 147*  BILITOT 0.7  BILIDIR 0.1  IBILI 0.6   Hepatitis Panel Recent Labs    02/20/20 0646  HEPBSAG NON REACTIVE  HCVAB NON REACTIVE  HEPAIGM NON REACTIVE  HEPBIGM NON REACTIVE   Studies/Results: MR 3D Recon At Scanner  Result Date: 02/20/2020 CLINICAL DATA:  Acute onset upper abdominal pain and nausea today. Elevated liver function tests. Cholelithiasis. EXAM: MRI ABDOMEN WITHOUT AND WITH CONTRAST (INCLUDING MRCP) TECHNIQUE: Multiplanar multisequence MR imaging of the abdomen was performed both before and after the administration of intravenous contrast. Heavily T2-weighted images of the biliary and pancreatic ducts were obtained, and three-dimensional MRCP images were rendered by post processing. CONTRAST:  82mL GADAVIST GADOBUTROL 1 MMOL/ML IV SOLN COMPARISON:  None. FINDINGS: Lower chest: No acute findings. Hepatobiliary: No hepatic masses identified. Numerous small gallstones are seen nearly filling the gallbladder, however there is no evidence of cholecystitis. No evidence of biliary ductal dilatation. Pancreas: Normal appearance. No mass or inflammatory changes  seen. No evidence of pancreatic ductal dilatation or pancreas divisum. Spleen:  Within normal limits in size and appearance. Adrenals/Urinary Tract: No masses identified. No evidence of hydronephrosis. Stomach/Bowel: Visualized portion unremarkable. Vascular/Lymphatic: No pathologically enlarged lymph nodes identified. No abdominal aortic aneurysm. Other:  None. Musculoskeletal:  No suspicious bone lesions identified. IMPRESSION: Cholelithiasis. No radiographic evidence of cholecystitis, biliary ductal dilatation, or other acute findings. Electronically Signed   By: Danae Orleans M.D.   On: 02/20/2020 20:52   MR ABDOMEN MRCP W WO CONTAST  Result Date: 02/20/2020 CLINICAL DATA:  Acute onset upper abdominal pain and nausea today. Elevated liver function tests. Cholelithiasis. EXAM: MRI ABDOMEN WITHOUT AND WITH CONTRAST (INCLUDING MRCP) TECHNIQUE: Multiplanar multisequence MR imaging of the abdomen was performed both before and after the administration of intravenous contrast. Heavily T2-weighted images of the biliary and pancreatic ducts were obtained, and three-dimensional MRCP images were rendered by post processing. CONTRAST:  68mL GADAVIST GADOBUTROL 1 MMOL/ML IV SOLN COMPARISON:  None. FINDINGS: Lower chest: No acute findings. Hepatobiliary: No hepatic masses identified. Numerous small gallstones are seen nearly filling the gallbladder, however there is no evidence of cholecystitis. No evidence of biliary ductal dilatation. Pancreas: Normal appearance. No mass or inflammatory changes seen. No evidence of pancreatic ductal dilatation or pancreas divisum. Spleen:  Within normal limits in size and appearance. Adrenals/Urinary Tract: No masses identified. No evidence of hydronephrosis. Stomach/Bowel: Visualized portion unremarkable. Vascular/Lymphatic: No pathologically enlarged lymph nodes identified. No abdominal aortic aneurysm. Other:  None. Musculoskeletal:  No suspicious bone lesions identified. IMPRESSION:  Cholelithiasis. No radiographic evidence of cholecystitis, biliary ductal dilatation, or other acute findings. Electronically Signed   By: Danae Orleans M.D.   On: 02/20/2020 20:52   US Abdomen Limited RUQ (LIVER/GB)  Result Date: 02/20/2020 CLINICAL DATA:  Epigastric pain for 1 day with radiation to the right shoulder EXAM: ULTRASOUND ABDOMEN LIMITED RIGHT UPPER QUADRANT COMPARISON:  None similar FINDINGS: Gallbladder: Innumerable shadowing gallstones with measured stone at 7 mm. The gallbladder appears fall but there is no wall thickening, pericholecystic edema, or focal tenderness. Common bile duct: Diameter: 4 mm Liver: No focal lesion identified. Within normal limits in parenchymal echogenicity. Portal vein is patent on color Doppler imaging with normal direction of blood flow towards the liver.  IMPRESSION: Numerous gallstones.  No evidence of acute cholecystitis. Electronically Signed   By: Monte Fantasia M.D.   On: 02/20/2020 06:35   IMPRESSION:  *40 year old female presenting with sudden onset upper abdominal pain.  Lipase 291.  Significant transaminitis with AST and ALT >2K/1K respectively.  Ultrasound and MRCP show numerous gallstones but not ductal dilation.  LFTs down significantly this morning.  Had similar episode 2 years ago that resolved spontaneously but did have a slight bump in LFTs at that time as well.  Suspect that she passed a gallstone.  PLAN: -Trend LFTs. -No ERCP needed for now. -Surgical consult has been called.  Laban Emperor. Zehr  02/21/2020, 11:04 AM    Attending physician's note   I have taken a history, examined the patient and reviewed the chart. I agree with the Advanced Practitioner's note, impression and recommendations.  50 yr very pleasant female admitted with upper abdominal pain, significant elevation in transaminases and alk phos. S/p Roux-en-Y Multiple small gallstones with no evidence of cholecystitis, CBD is normal size with no choledocholithiasis on  MRCP Possible cbd stone that spontaneously passed though causing mild pancreatitis and transaminitis Recommend cholecystectomy to prevent recurrent episode esp given altered anatomy with h/o Roux-en-Y Continue supportive care and monitor daily LFT  GI is available if needed, please call with any questions   The patient was provided an opportunity to ask questions and all were answered. The patient agreed with the plan and demonstrated an understanding of the instructions.  Damaris Hippo , MD [phone]

## 2020-02-21 NOTE — Consult Note (Addendum)
[name] [name] 1980-05-02  [id number].    Requesting MD: Dr. Arville Go Chief Complaint/Reason for Consult: cholelithiasis, elevated LFTs  HPI:  This is a 40 yo female with a history of obesity, s/p roux-en-y gastric bypass in 2014 (Dr. Lodema Pilot), depression, anxiety, and iron deficiency anemia followed by Dr. Candise Che.  She had DM and HTN but this seemed to resolve with weight loss.  She lost about 120lbs and then has gained about 30lbs back.  She no longer takes her multi-vitamin, but does not smoke or use NSAIDs.  She denies reflux symptoms.  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      About 2 years ago she had an acute onset of epigastric abdominal pain and presented to the ED for evaluation.  She was told that this was likely secondary to constipation, but it resolved and no further work up was completed.  On Sunday around 2145 after eating [employer] fried chicken and biscuit for dinner she started having epigastric abdominal pain.  This was sharp in natures and severe.  She denies any N/V.  This ultimately radiated to both upper quadrants and to her right shoulder. She denies any fevers.  Nothing helped her pain.  She presented to the ED for evaluation.  She had an [place] that revealed numerous gallstones but no cholecystitis.  Her AST/ALT were very high 2704/1075.  Her alkphos was only 148 and she had a normal TB.  Her lipase was mildly elevated at 291.  She underwent MRCP which was normal.  Her hepatitis panel was negative as well.  We have been asked to see her for evaluation and consideration of cholecystectomy.  ROS: ROS: Please see HPI, otherwise all other systems have been reviewed and are negative  Family History  Problem Relation Age of Onset  . Diabetes Mother   . Hypertension Mother   . Miscarriages / [place] Mother   . Diabetes Maternal Aunt   . Diabetes Maternal Grandfather   . Dementia  Maternal Grandmother   . Diabetes Maternal Grandmother   . Mental illness Brother     Past Medical History:  Diagnosis Date  . Anxiety   . Depression   . Diabetes mellitus 05/2008   DR. BALAN  . GERD (gastroesophageal reflux disease)    after taking antibiotics it went away  . Hypertension   . Obesity     Past Surgical History:  Procedure Laterality Date  . BREATH TEK H PYLORI N/A 08/20/2012   Procedure: BREATH TEK H PYLORI;  Surgeon: Lodema Pilot, DO;  Location: [facility];  Service: Endoscopy;  Laterality: N/A;  . GASTRIC ROUX-EN-Y N/A 08/24/2012   Procedure: LAPAROSCOPIC ROUX-EN-Y GASTRIC BYPASS WITH UPPER ENDOSCOPY;  Surgeon: Lodema Pilot, DO;  Location: [facility];  Service: General;  Laterality: N/A;    Social History:  reports that she has never smoked. She has never used smokeless tobacco. She reports that she does not drink alcohol and does not use drugs.  Allergies:  Allergies  Allergen Reactions  . Metformin And Related Itching    Medications Prior to Admission  Medication Sig Dispense Refill  . acetaminophen (TYLENOL) 500 MG tablet Take 1,000 mg by mouth every 6 (six) hours as needed for moderate pain.    . cetirizine (ZYRTEC) 10 MG tablet TAKE 1 TABLET BY MOUTH EVERY DAY (Patient taking differently: Take 10 mg by mouth daily.) 90 tablet 0  . hydrocortisone valerate ointment (  WESTCORT) 0.2 % Apply 1 application topically 2 (two) times daily. (Patient taking differently: Apply 1 application topically 2 (two) times daily as needed (itching).) 60 g 1  . sertraline (ZOLOFT) 50 MG tablet TAKE 1 AND 1/2 TABLETS BY MOUTH DAILY (Patient taking differently: Take 75 mg by mouth daily.) 135 tablet 1     Physical Exam: Blood pressure 116/81, pulse 81, temperature 97.7 F (36.5 C), temperature source Oral, resp. rate 16, height 5\' 10"  (1.778 m), weight 98.4 kg, last menstrual period 02/07/2020, SpO2 100 %. General: pleasant, WD, WN black female who is laying in bed in  NAD HEENT: head is normocephalic, atraumatic.  Sclera are noninjected.  PERRL.  Ears and nose without any masses or lesions.  Mouth is pink and moist Heart: regular, rate, and rhythm.  Normal s1,s2. No obvious murmurs, gallops, or rubs noted.  Palpable radial and pedal pulses bilaterally Lungs: CTAB, no wheezes, rhonchi, or rales noted.  Respiratory effort nonlabored Abd: soft, minimal epigastric abdominal tenderness, ND, +BS, no masses, hernias, or organomegaly MS: all 4 extremities are symmetrical with no cyanosis, clubbing, or edema. Skin: warm and dry with no masses, lesions, or rashes Neuro: Cranial nerves 2-12 grossly intact, sensation is normal throughout Psych: A&Ox3 with an appropriate affect.   Results for orders placed or performed during the hospital encounter of 02/20/20 (from the past 48 hour(s))  Urinalysis, Routine w reflex microscopic     Status: None   Collection Time: 02/20/20  4:22 AM  Result Value Ref Range   Color, Urine YELLOW YELLOW   APPearance CLEAR CLEAR   Specific Gravity, Urine 1.010 1.005 - 1.030   pH 7.5 5.0 - 8.0   Glucose, UA NEGATIVE NEGATIVE mg/dL   Hgb urine dipstick NEGATIVE NEGATIVE   Bilirubin Urine NEGATIVE NEGATIVE   Ketones, ur NEGATIVE NEGATIVE mg/dL   Protein, ur NEGATIVE NEGATIVE mg/dL   Nitrite NEGATIVE NEGATIVE   Leukocytes,Ua NEGATIVE NEGATIVE    Comment: Microscopic not done on urines with negative protein, blood, leukocytes, nitrite, or glucose < 500 mg/dL. Performed at [facility], [address] [place], [place] [address]   Pregnancy, urine     Status: None   Collection Time: 02/20/20  4:22 AM  Result Value Ref Range   Preg Test, Ur NEGATIVE NEGATIVE    Comment:        THE SENSITIVITY OF THIS METHODOLOGY IS >20 mIU/mL. Performed at [facility], [address] [place], [place] [address]   CBC with Differential     Status: Abnormal   Collection Time: 02/20/20  4:34 AM  Result Value Ref Range   WBC 4.8  4.0 - 10.5 K/uL   RBC 4.77 3.87 - 5.11 MIL/uL   Hemoglobin 12.3 12.0 - 15.0 g/dL   HCT 09.8 11.9 - 14.7 %   MCV 82.6 80.0 - 100.0 fL   MCH 25.8 (L) 26.0 - 34.0 pg   MCHC 31.2 30.0 - 36.0 g/dL   RDW 82.9 56.2 - 13.0 %   Platelets 326 150 - 400 K/uL   nRBC 0.0 0.0 - 0.2 %   Neutrophils Relative % 71 %   Neutro Abs 3.4 1.7 - 7.7 K/uL   Lymphocytes Relative 19 %   Lymphs Abs 0.9 0.7 - 4.0 K/uL   Monocytes Relative 8 %   Monocytes Absolute 0.4 0.1 - 1.0 K/uL   Eosinophils Relative 2 %   Eosinophils Absolute 0.1 0.0 - 0.5 K/uL   Basophils Relative  0 %   Basophils Absolute 0.0 0.0 - 0.1 K/uL   Immature Granulocytes 0 %   Abs Immature Granulocytes 0.01 0.00 - 0.07 K/uL    Comment: Performed at [facility], [address] [place], [place] [address]  Comprehensive metabolic panel     Status: Abnormal   Collection Time: 02/20/20  4:34 AM  Result Value Ref Range   Sodium 136 135 - 145 mmol/L   Potassium 4.0 3.5 - 5.1 mmol/L   Chloride 100 98 - 111 mmol/L   CO2 26 22 - 32 mmol/L   Glucose, Bld 159 (H) 70 - 99 mg/dL    Comment: Glucose reference range applies only to samples taken after fasting for at least 8 hours.   BUN 11 6 - 20 mg/dL   Creatinine, Ser 6.56 0.44 - 1.00 mg/dL   Calcium 8.6 (L) 8.9 - 10.3 mg/dL   Total Protein 7.6 6.5 - 8.1 g/dL   Albumin 3.8 3.5 - 5.0 g/dL   AST 8,127 (H) 15 - 41 U/L    Comment: RESULTS CONFIRMED BY MANUAL DILUTION   ALT 1,075 (H) 0 - 44 U/L   Alkaline Phosphatase 148 (H) 38 - 126 U/L   Total Bilirubin 1.1 0.3 - 1.2 mg/dL   GFR, Estimated >51 >70 mL/min    Comment: (NOTE) Calculated using the CKD-EPI Creatinine Equation (2021)    Anion gap 10 5 - 15    Comment: Performed at [facility], [address] [place], [place] [address]  Lipase, blood     Status: Abnormal   Collection Time: 02/20/20  4:34 AM  Result Value Ref Range   Lipase 291 (H) 11 - 51 U/L    Comment: Performed at [facility], [address] [place], [place] [address]  Acetaminophen level     Status: Abnormal   Collection Time: 02/20/20  6:46 AM  Result Value Ref Range   Acetaminophen (Tylenol), Serum <10 (L) 10 - 30 ug/mL    Comment: (NOTE) Therapeutic concentrations vary significantly. A range of 10-30 ug/mL  may be an effective concentration for many patients. However, some  are best treated at concentrations outside of this range. Acetaminophen concentrations >150 ug/mL at 4 hours after ingestion  and >50 ug/mL at 12 hours after ingestion are often associated with  toxic reactions.  Performed at [facility], [address] [place], [place] [address]   Hepatitis panel, acute     Status: None   Collection Time: 02/20/20  6:46 AM  Result Value Ref Range   Hepatitis B Surface Ag NON REACTIVE NON REACTIVE   HCV Ab NON REACTIVE NON REACTIVE    Comment: (NOTE) Nonreactive HCV antibody screen is consistent with no HCV infections,  unless recent infection is suspected or other evidence exists to indicate HCV infection.     Hep A IgM NON REACTIVE NON REACTIVE   Hep B C IgM NON REACTIVE NON REACTIVE    Comment: Performed at [facility], [address], [place], [place] [address]  SARS Coronavirus 2 by RT PCR (hospital order, performed in [facility] hospital lab) Nasopharyngeal Nasopharyngeal Swab     Status: None   Collection Time: 02/20/20  6:55 AM   Specimen: Nasopharyngeal Swab  Result Value Ref Range   SARS Coronavirus 2 NEGATIVE NEGATIVE    Comment: (NOTE) SARS-CoV-2 target nucleic acids are NOT DETECTED.  The SARS-CoV-2 RNA is generally detectable in  upper and lower respiratory specimens during the acute phase of infection. The lowest concentration of SARS-CoV-2 viral copies this assay can detect is 250 copies / mL. A negative result does not preclude SARS-CoV-2 infection and should not be used as the sole basis for treatment or other patient management decisions.  A negative result may occur  with improper specimen collection / handling, submission of specimen other than nasopharyngeal swab, presence of viral mutation(s) within the areas targeted by this assay, and inadequate number of viral copies (<250 copies / mL). A negative result must be combined with clinical observations, patient history, and epidemiological information.  Fact Sheet for Patients:   BoilerBrush.com.cy  Fact Sheet for Healthcare Providers: https://pope.com/  This test is not yet approved or  cleared by the [place] FDA and has been authorized for detection and/or diagnosis of SARS-CoV-2 by FDA under an Emergency Use Authorization (EUA).  This EUA will remain in effect (meaning this test can be used) for the duration of the COVID-19 declaration under Section 564(b)(1) of the Act, 21 U.S.C. section 360bbb-3(b)(1), unless the authorization is terminated or revoked sooner.  Performed at [facility], [address] [place], [place] [address]   HIV Antibody (routine testing w rflx)     Status: None   Collection Time: 02/21/20  2:51 AM  Result Value Ref Range   HIV Screen 4th Generation wRfx Non Reactive Non Reactive    Comment: Performed at [facility], [address], [place], [place] [address]  CBC     Status: Abnormal   Collection Time: 02/21/20  2:51 AM  Result Value Ref Range   WBC 5.3 4.0 - 10.5 K/uL   RBC 4.10 3.87 - 5.11 MIL/uL   Hemoglobin 10.8 (L) 12.0 - 15.0 g/dL   HCT 09.8 (L) 11.9 - 14.7 %   MCV 84.9 80.0 - 100.0 fL   MCH 26.3 26.0 - 34.0 pg   MCHC 31.0 30.0 - 36.0 g/dL   RDW 82.9 56.2 - 13.0 %   Platelets 280 150 - 400 K/uL   nRBC 0.0 0.0 - 0.2 %    Comment: Performed at [facility], [address], [place], [place] [address]  Basic metabolic panel     Status: Abnormal   Collection Time: 02/21/20  2:51 AM  Result Value Ref Range   Sodium 141 135 - 145 mmol/L   Potassium 3.9 3.5 - 5.1  mmol/L   Chloride 108 98 - 111 mmol/L   CO2 26 22 - 32 mmol/L   Glucose, Bld 95 70 - 99 mg/dL    Comment: Glucose reference range applies only to samples taken after fasting for at least 8 hours.   BUN 8 6 - 20 mg/dL   Creatinine, Ser 4.69 0.44 - 1.00 mg/dL   Calcium 8.2 (L) 8.9 - 10.3 mg/dL   GFR, Estimated >62 >95 mL/min    Comment: (NOTE) Calculated using the CKD-EPI Creatinine Equation (2021)    Anion gap 7 5 - 15    Comment: Performed at [facility], [address], [place], [place] [address]  Hepatic function panel     Status: Abnormal   Collection Time: 02/21/20  2:51 AM  Result Value Ref Range   Total Protein 6.0 (L) 6.5 - 8.1 g/dL   Albumin 2.8 (L) 3.5 - 5.0 g/dL   AST 244 (H) 15 - 41 U/L   ALT 660 (H) 0 - 44 U/L   Alkaline  Phosphatase 147 (H) 38 - 126 U/L   Total Bilirubin 0.7 0.3 - 1.2 mg/dL   Bilirubin, Direct 0.1 0.0 - 0.2 mg/dL   Indirect Bilirubin 0.6 0.3 - 0.9 mg/dL    Comment: Performed at [facility], [address] [place], [place] [address]   MR 3D Recon At Scanner  Result Date: 02/20/2020 CLINICAL DATA:  Acute onset upper abdominal pain and nausea today. Elevated liver function tests. Cholelithiasis. EXAM: MRI ABDOMEN WITHOUT AND WITH CONTRAST (INCLUDING MRCP) TECHNIQUE: Multiplanar multisequence MR imaging of the abdomen was performed both before and after the administration of intravenous contrast. Heavily T2-weighted images of the biliary and pancreatic ducts were obtained, and three-dimensional MRCP images were rendered by post processing. CONTRAST:  2mL GADAVIST GADOBUTROL 1 MMOL/ML IV SOLN COMPARISON:  None. FINDINGS: Lower chest: No acute findings. Hepatobiliary: No hepatic masses identified. Numerous small gallstones are seen nearly filling the gallbladder, however there is no evidence of cholecystitis. No evidence of biliary ductal dilatation. Pancreas: Normal appearance. No mass or inflammatory changes seen. No  evidence of pancreatic ductal dilatation or pancreas divisum. Spleen:  Within normal limits in size and appearance. Adrenals/Urinary Tract: No masses identified. No evidence of hydronephrosis. Stomach/Bowel: Visualized portion unremarkable. Vascular/Lymphatic: No pathologically enlarged lymph nodes identified. No abdominal aortic aneurysm. Other:  None. Musculoskeletal:  No suspicious bone lesions identified. IMPRESSION: Cholelithiasis. No radiographic evidence of cholecystitis, biliary ductal dilatation, or other acute findings. Electronically Signed   By: Danae Orleans M.D.   On: 02/20/2020 20:52   MR ABDOMEN MRCP W WO CONTAST  Result Date: 02/20/2020 CLINICAL DATA:  Acute onset upper abdominal pain and nausea today. Elevated liver function tests. Cholelithiasis. EXAM: MRI ABDOMEN WITHOUT AND WITH CONTRAST (INCLUDING MRCP) TECHNIQUE: Multiplanar multisequence MR imaging of the abdomen was performed both before and after the administration of intravenous contrast. Heavily T2-weighted images of the biliary and pancreatic ducts were obtained, and three-dimensional MRCP images were rendered by post processing. CONTRAST:  19mL GADAVIST GADOBUTROL 1 MMOL/ML IV SOLN COMPARISON:  None. FINDINGS: Lower chest: No acute findings. Hepatobiliary: No hepatic masses identified. Numerous small gallstones are seen nearly filling the gallbladder, however there is no evidence of cholecystitis. No evidence of biliary ductal dilatation. Pancreas: Normal appearance. No mass or inflammatory changes seen. No evidence of pancreatic ductal dilatation or pancreas divisum. Spleen:  Within normal limits in size and appearance. Adrenals/Urinary Tract: No masses identified. No evidence of hydronephrosis. Stomach/Bowel: Visualized portion unremarkable. Vascular/Lymphatic: No pathologically enlarged lymph nodes identified. No abdominal aortic aneurysm. Other:  None. Musculoskeletal:  No suspicious bone lesions identified. IMPRESSION:  Cholelithiasis. No radiographic evidence of cholecystitis, biliary ductal dilatation, or other acute findings. Electronically Signed   By: Danae Orleans M.D.   On: 02/20/2020 20:52   US Abdomen Limited RUQ (LIVER/GB)  Result Date: 02/20/2020 CLINICAL DATA:  Epigastric pain for 1 day with radiation to the right shoulder EXAM: ULTRASOUND ABDOMEN LIMITED RIGHT UPPER QUADRANT COMPARISON:  None similar FINDINGS: Gallbladder: Innumerable shadowing gallstones with measured stone at 7 mm. The gallbladder appears fall but there is no wall thickening, pericholecystic edema, or focal tenderness. Common bile duct: Diameter: 4 mm Liver: No focal lesion identified. Within normal limits in parenchymal echogenicity. Portal vein is patent on color Doppler imaging with normal direction of blood flow towards the liver. IMPRESSION: Numerous gallstones.  No evidence of acute cholecystitis. Electronically Signed   By: Marnee Spring M.D.   On: 02/20/2020 06:35      Assessment/Plan S/p roux en  y gastric bypass in 2014 Iron deficiency anemia Depression  Anxiety  Cholelithiasis/elevated LFTs/mild pancreatitis The patient's symptoms are c/w biliary colic.  She does not appear to have evidence of cholecystitis with no diagnostic findings or lab findings concerning for this.  She did have a mild pancreatitis noted on lab yesterday with a lipase of 291.  It is possible she passed a gallstone causing this elevation; however, her TB was normal on admission.  Her AST/ALT were significantly elevated.  Her hepatitis panel was negative.  It is abnormal to have transaminases this high with just an episode of biliary colic; however, given her surgical history of a roux-en-y bypass we do not want to leave her gallbladder in place and her develop choledocholithiasis as it would make this significantly more challenging to manage due to the change in anatomy.  We will plan to proceed with lap chole tomorrow with Dr. Daphine DeutscherMartin.   FEN - soft  diet, NPO p MN VTE - lovenox ID - rocephin on call to OR tomorrow   Letha CapeKelly E Shontay Wallner, PA-C [facility] 02/21/2020, 1:24 PM Please see Amion for pager number during day hours 7:00am-4:30pm or 7:00am -11:30am on weekends

## 2020-02-21 NOTE — Progress Notes (Signed)
Reviewed and concur with student's documentation.

## 2020-02-22 ENCOUNTER — Encounter (HOSPITAL_COMMUNITY)
Admission: EM | Disposition: A | Discharge status: Home / Self Care | Payer: Self-pay | Source: Home / Self Care | Attending: Internal Medicine

## 2020-02-22 ENCOUNTER — Encounter (HOSPITAL_COMMUNITY): Payer: Self-pay | Admitting: Internal Medicine

## 2020-02-22 ENCOUNTER — Inpatient Hospital Stay (HOSPITAL_COMMUNITY): Payer: Managed Care, Other (non HMO)

## 2020-02-22 ENCOUNTER — Inpatient Hospital Stay (HOSPITAL_COMMUNITY): Payer: Managed Care, Other (non HMO) | Admitting: Anesthesiology

## 2020-02-22 DIAGNOSIS — K851 Biliary acute pancreatitis without necrosis or infection: Secondary | ICD-10-CM | POA: Diagnosis not present

## 2020-02-22 HISTORY — PX: CHOLECYSTECTOMY: SHX55

## 2020-02-22 LAB — HEPATIC FUNCTION PANEL
ALT: 390 U/L — ABNORMAL HIGH (ref 0–44)
AST: 217 U/L — ABNORMAL HIGH (ref 15–41)
Albumin: 3 g/dL — ABNORMAL LOW (ref 3.5–5.0)
Alkaline Phosphatase: 132 U/L — ABNORMAL HIGH (ref 38–126)
Bilirubin, Direct: 0.1 mg/dL (ref 0.0–0.2)
Indirect Bilirubin: 0.3 mg/dL (ref 0.3–0.9)
Total Bilirubin: 0.4 mg/dL (ref 0.3–1.2)
Total Protein: 6.1 g/dL — ABNORMAL LOW (ref 6.5–8.1)

## 2020-02-22 LAB — BASIC METABOLIC PANEL
Anion gap: 8 (ref 5–15)
BUN: 9 mg/dL (ref 6–20)
CO2: 25 mmol/L (ref 22–32)
Calcium: 8.3 mg/dL — ABNORMAL LOW (ref 8.9–10.3)
Chloride: 108 mmol/L (ref 98–111)
Creatinine, Ser: 0.75 mg/dL (ref 0.44–1.00)
GFR, Estimated: >60 mL/min (ref >60)
Glucose, Bld: 104 mg/dL — ABNORMAL HIGH (ref 70–99)
Potassium: 3.6 mmol/L (ref 3.5–5.1)
Sodium: 141 mmol/L (ref 135–145)

## 2020-02-22 LAB — CBC
HCT: 33.2 % — ABNORMAL LOW (ref 36.0–46.0)
Hemoglobin: 10.3 g/dL — ABNORMAL LOW (ref 12.0–15.0)
MCH: 25.9 pg — ABNORMAL LOW (ref 26.0–34.0)
MCHC: 31 g/dL (ref 30.0–36.0)
MCV: 83.6 fL (ref 80.0–100.0)
Platelets: 262 10*3/uL (ref 150–400)
RBC: 3.97 MIL/uL (ref 3.87–5.11)
RDW: 13.4 % (ref 11.5–15.5)
WBC: 4.8 10*3/uL (ref 4.0–10.5)
nRBC: 0 % (ref 0.0–0.2)

## 2020-02-22 LAB — GLUCOSE, CAPILLARY: Glucose-Capillary: 135 mg/dL — ABNORMAL HIGH (ref 70–99)

## 2020-02-22 LAB — SURGICAL PCR SCREEN
MRSA, PCR: NEGATIVE
Staphylococcus aureus: NEGATIVE

## 2020-02-22 SURGERY — LAPAROSCOPIC CHOLECYSTECTOMY WITH INTRAOPERATIVE CHOLANGIOGRAM
Anesthesia: General | Site: Abdomen

## 2020-02-22 MED ORDER — ACETAMINOPHEN 500 MG PO TABS
1000.0000 mg | ORAL_TABLET | Freq: Once | ORAL | Status: AC
  Administered 2020-02-22: 1000 mg via ORAL
  Filled 2020-02-22: qty 2

## 2020-02-22 MED ORDER — RINGERS IRRIGATION IR SOLN
Status: DC | PRN
  Administered 2020-02-22: 1000 mL

## 2020-02-22 MED ORDER — LACTATED RINGERS IV SOLN: INTRAVENOUS | Status: DC

## 2020-02-22 MED ORDER — PHENYLEPHRINE 40 MCG/ML (10ML) SYRINGE FOR IV PUSH (FOR BLOOD PRESSURE SUPPORT)
PREFILLED_SYRINGE | INTRAVENOUS | Status: DC | PRN
  Administered 2020-02-22 (×2): 120 ug via INTRAVENOUS
  Administered 2020-02-22: 160 ug via INTRAVENOUS

## 2020-02-22 MED ORDER — FENTANYL CITRATE (PF) 100 MCG/2ML IJ SOLN
25.0000 ug | INTRAMUSCULAR | Status: DC | PRN
  Administered 2020-02-22 (×2): 50 ug via INTRAVENOUS

## 2020-02-22 MED ORDER — PROPOFOL 10 MG/ML IV BOLUS
INTRAVENOUS | Status: DC | PRN
  Administered 2020-02-22: 20 mg via INTRAVENOUS
  Administered 2020-02-22: 180 mg via INTRAVENOUS

## 2020-02-22 MED ORDER — FENTANYL CITRATE (PF) 250 MCG/5ML IJ SOLN
INTRAMUSCULAR | Status: AC
  Filled 2020-02-22: qty 5

## 2020-02-22 MED ORDER — SCOPOLAMINE 1 MG/3DAYS TD PT72
MEDICATED_PATCH | TRANSDERMAL | Status: AC
  Filled 2020-02-22: qty 1

## 2020-02-22 MED ORDER — ONDANSETRON HCL 4 MG/2ML IJ SOLN
INTRAMUSCULAR | Status: DC | PRN
  Administered 2020-02-22: 4 mg via INTRAVENOUS

## 2020-02-22 MED ORDER — SODIUM CHLORIDE 0.9 % IV SOLN: INTRAVENOUS | Status: DC

## 2020-02-22 MED ORDER — MIDAZOLAM HCL 2 MG/2ML IJ SOLN
INTRAMUSCULAR | Status: AC
  Filled 2020-02-22: qty 2

## 2020-02-22 MED ORDER — ROCURONIUM BROMIDE 10 MG/ML (PF) SYRINGE
PREFILLED_SYRINGE | INTRAVENOUS | Status: DC | PRN
  Administered 2020-02-22: 80 mg via INTRAVENOUS

## 2020-02-22 MED ORDER — MIDAZOLAM HCL 2 MG/2ML IJ SOLN
INTRAMUSCULAR | Status: DC | PRN
  Administered 2020-02-22: 2 mg via INTRAVENOUS

## 2020-02-22 MED ORDER — ROCURONIUM BROMIDE 10 MG/ML (PF) SYRINGE
PREFILLED_SYRINGE | INTRAVENOUS | Status: AC
  Filled 2020-02-22: qty 10

## 2020-02-22 MED ORDER — PHENYLEPHRINE 40 MCG/ML (10ML) SYRINGE FOR IV PUSH (FOR BLOOD PRESSURE SUPPORT)
PREFILLED_SYRINGE | INTRAVENOUS | Status: AC
  Filled 2020-02-22: qty 10

## 2020-02-22 MED ORDER — FENTANYL CITRATE (PF) 100 MCG/2ML IJ SOLN
INTRAMUSCULAR | Status: AC
  Filled 2020-02-22: qty 2

## 2020-02-22 MED ORDER — ONDANSETRON HCL 4 MG/2ML IJ SOLN
INTRAMUSCULAR | Status: AC
  Filled 2020-02-22: qty 2

## 2020-02-22 MED ORDER — LIDOCAINE HCL (PF) 2 % IJ SOLN
INTRAMUSCULAR | Status: AC
  Filled 2020-02-22: qty 5

## 2020-02-22 MED ORDER — SCOPOLAMINE 1 MG/3DAYS TD PT72
1.0000 | MEDICATED_PATCH | TRANSDERMAL | Status: DC
  Administered 2020-02-22: 1.5 mg via TRANSDERMAL

## 2020-02-22 MED ORDER — CELECOXIB 200 MG PO CAPS
200.0000 mg | ORAL_CAPSULE | Freq: Once | ORAL | Status: AC
  Administered 2020-02-22: 200 mg via ORAL
  Filled 2020-02-22: qty 1

## 2020-02-22 MED ORDER — SUGAMMADEX SODIUM 200 MG/2ML IV SOLN
INTRAVENOUS | Status: DC | PRN
  Administered 2020-02-22: 200 mg via INTRAVENOUS

## 2020-02-22 MED ORDER — LIDOCAINE 2% (20 MG/ML) 5 ML SYRINGE
INTRAMUSCULAR | Status: DC | PRN
  Administered 2020-02-22 (×2): 50 mg via INTRAVENOUS

## 2020-02-22 MED ORDER — BUPIVACAINE LIPOSOME 1.3 % IJ SUSP
20.0000 mL | Freq: Once | INTRAMUSCULAR | Status: AC
  Administered 2020-02-22: 20 mL
  Filled 2020-02-22: qty 20

## 2020-02-22 MED ORDER — ACETAMINOPHEN 325 MG PO TABS
650.0000 mg | ORAL_TABLET | Freq: Four times a day (QID) | ORAL | Status: DC | PRN
  Administered 2020-02-23: 650 mg via ORAL
  Filled 2020-02-22: qty 2

## 2020-02-22 MED ORDER — IOPAMIDOL (ISOVUE-300) INJECTION 61%
INTRAVENOUS | Status: DC | PRN
  Administered 2020-02-22: 12 mL

## 2020-02-22 MED ORDER — AMISULPRIDE (ANTIEMETIC) 5 MG/2ML IV SOLN: 10.0000 mg | Freq: Once | INTRAVENOUS | Status: DC | PRN

## 2020-02-22 MED ORDER — FENTANYL CITRATE (PF) 100 MCG/2ML IJ SOLN
INTRAMUSCULAR | Status: DC | PRN
  Administered 2020-02-22 (×5): 50 ug via INTRAVENOUS

## 2020-02-22 MED ORDER — DEXAMETHASONE SODIUM PHOSPHATE 10 MG/ML IJ SOLN
INTRAMUSCULAR | Status: DC | PRN
  Administered 2020-02-22: 8 mg via INTRAVENOUS

## 2020-02-22 MED ORDER — DEXAMETHASONE SODIUM PHOSPHATE 10 MG/ML IJ SOLN
INTRAMUSCULAR | Status: AC
  Filled 2020-02-22: qty 1

## 2020-02-22 MED ORDER — ENOXAPARIN SODIUM 40 MG/0.4ML ~~LOC~~ SOLN
40.0000 mg | SUBCUTANEOUS | Status: DC
  Administered 2020-02-23: 40 mg via SUBCUTANEOUS
  Filled 2020-02-22: qty 0.4

## 2020-02-22 MED ORDER — 0.9 % SODIUM CHLORIDE (POUR BTL) OPTIME
TOPICAL | Status: DC | PRN
  Administered 2020-02-22: 1000 mL

## 2020-02-22 SURGICAL SUPPLY — 46 items
ADH SKN CLS APL DERMABOND .7 (GAUZE/BANDAGES/DRESSINGS) ×1
APL SKNCLS STERI-STRIP NONHPOA (GAUZE/BANDAGES/DRESSINGS)
APL SWBSTK 6 STRL LF DISP (MISCELLANEOUS) ×2
APPLICATOR COTTON TIP 6 STRL (MISCELLANEOUS) ×2 IMPLANT
APPLICATOR COTTON TIP 6IN STRL (MISCELLANEOUS) ×4 IMPLANT
APPLIER CLIP 5 13 M/L LIGAMAX5 (MISCELLANEOUS)
APPLIER CLIP ROT 10 11.4 M/L (STAPLE)
APR CLP MED LRG 11.4X10 (STAPLE)
APR CLP MED LRG 5 ANG JAW (MISCELLANEOUS)
BAG SPEC RTRVL 10 TROC 200 (ENDOMECHANICALS)
BENZOIN TINCTURE PRP APPL 2/3 (GAUZE/BANDAGES/DRESSINGS) IMPLANT
CABLE HIGH FREQUENCY MONO STRZ (ELECTRODE) IMPLANT
CATH REDDICK CHOLANGI 4FR 50CM (CATHETERS) ×2 IMPLANT
CLIP APPLIE 5 13 M/L LIGAMAX5 (MISCELLANEOUS) IMPLANT
CLIP APPLIE ROT 10 11.4 M/L (STAPLE) IMPLANT
COVER MAYO STAND STRL (DRAPES) ×2 IMPLANT
COVER SURGICAL LIGHT HANDLE (MISCELLANEOUS) ×2 IMPLANT
COVER WAND RF STERILE (DRAPES) IMPLANT
DECANTER SPIKE VIAL GLASS SM (MISCELLANEOUS) ×2 IMPLANT
DERMABOND ADVANCED (GAUZE/BANDAGES/DRESSINGS) ×1
DERMABOND ADVANCED .7 DNX12 (GAUZE/BANDAGES/DRESSINGS) ×1 IMPLANT
DRAPE C-ARM 42X120 X-RAY (DRAPES) ×2 IMPLANT
ELECT L-HOOK LAP 45CM DISP (ELECTROSURGICAL)
ELECT PENCIL ROCKER SW 15FT (MISCELLANEOUS) IMPLANT
ELECT REM PT RETURN 15FT ADLT (MISCELLANEOUS) ×2 IMPLANT
ELECTRODE L-HOOK LAP 45CM DISP (ELECTROSURGICAL) IMPLANT
GLOVE BIOGEL M 8.0 STRL (GLOVE) ×2 IMPLANT
GOWN STRL REUS W/TWL XL LVL3 (GOWN DISPOSABLE) ×2 IMPLANT
HEMOSTAT SURGICEL 4X8 (HEMOSTASIS) IMPLANT
IV CATH 14GX2 1/4 (CATHETERS) ×2 IMPLANT
KIT BASIN OR (CUSTOM PROCEDURE TRAY) ×2 IMPLANT
KIT TURNOVER KIT A (KITS) IMPLANT
POUCH RETRIEVAL ECOSAC 10 (ENDOMECHANICALS) IMPLANT
POUCH RETRIEVAL ECOSAC 10MM (ENDOMECHANICALS)
SCISSORS LAP 5X45 EPIX DISP (ENDOMECHANICALS) ×2 IMPLANT
SET IRRIG TUBING LAPAROSCOPIC (IRRIGATION / IRRIGATOR) ×2 IMPLANT
SET TUBE SMOKE EVAC HIGH FLOW (TUBING) ×2 IMPLANT
SLEEVE XCEL OPT CAN 5 100 (ENDOMECHANICALS) ×2 IMPLANT
STRIP CLOSURE SKIN 1/2X4 (GAUZE/BANDAGES/DRESSINGS) IMPLANT
SUT MNCRL AB 4-0 PS2 18 (SUTURE) ×4 IMPLANT
SYR 20ML LL LF (SYRINGE) ×2 IMPLANT
TOWEL OR 17X26 10 PK STRL BLUE (TOWEL DISPOSABLE) ×2 IMPLANT
TRAY LAPAROSCOPIC (CUSTOM PROCEDURE TRAY) ×2 IMPLANT
TROCAR BLADELESS OPT 5 100 (ENDOMECHANICALS) ×2 IMPLANT
TROCAR XCEL BLUNT TIP 100MML (ENDOMECHANICALS) IMPLANT
TROCAR XCEL NON-BLD 11X100MML (ENDOMECHANICALS) IMPLANT

## 2020-02-22 NOTE — Progress Notes (Signed)
[facility] Progress Note  Day of Surgery  Subjective: CC-  No complaints today. Tolerated soft diet yesterday. Denies abdominal pain, nausea, vomiting.  LFTs trending down.  Objective: Vital signs in last 24 hours: Temp:  [98.1 F (36.7 C)-99.7 F (37.6 C)] 98.1 F (36.7 C) (02/02 0517) Pulse Rate:  [60-100] 60 (02/02 0517) Resp:  [16-18] 18 (02/02 0517) BP: (102-116)/(65-81) 104/79 (02/02 0517) SpO2:  [98 %-100 %] 99 % (02/02 0517) Last BM Date: 02/21/20  Intake/Output from previous day: 02/01 0701 - 02/02 0700 In: 3500 [P.O.:1020; I.V.:2480] Out: -  Intake/Output this shift: No intake/output data recorded.  PE: Gen:  Alert, NAD, pleasant Pulm:  rate and effort normal Abd: Soft, NT/ND, +BS, no HSM Psych: A&Ox4  Skin: no rashes noted, warm and dry  Lab Results:  Recent Labs    02/21/20 0251 02/22/20 0325  WBC 5.3 4.8  HGB 10.8* 10.3*  HCT 34.8* 33.2*  PLT 280 262   BMET Recent Labs    02/21/20 0251 02/22/20 0325  NA 141 141  K 3.9 3.6  CL 108 108  CO2 26 25  GLUCOSE 95 104*  BUN 8 9  CREATININE 0.80 0.75  CALCIUM 8.2* 8.3*   PT/INR No results for input(s): LABPROT, INR in the last 72 hours. CMP     Component Value Date/Time   NA 141 02/22/2020 0325   K 3.6 02/22/2020 0325   CL 108 02/22/2020 0325   CO2 25 02/22/2020 0325   GLUCOSE 104 (H) 02/22/2020 0325   BUN 9 02/22/2020 0325   CREATININE 0.75 02/22/2020 0325   CREATININE 0.83 06/29/2017 1353   CREATININE 0.78 07/16/2016 1551   CALCIUM 8.3 (L) 02/22/2020 0325   PROT 6.1 (L) 02/22/2020 0325   ALBUMIN 3.0 (L) 02/22/2020 0325   AST 217 (H) 02/22/2020 0325   AST 30 06/29/2017 1353   ALT 390 (H) 02/22/2020 0325   ALT 42 06/29/2017 1353   ALKPHOS 132 (H) 02/22/2020 0325   BILITOT 0.4 02/22/2020 0325   BILITOT 0.6 06/29/2017 1353   GFRNONAA >60 02/22/2020 0325   GFRNONAA >60 06/29/2017 1353   GFRAA >60 06/29/2017 1353   Lipase     Component Value Date/Time   LIPASE 291  (H) 02/20/2020 0434       Studies/Results: MR 3D Recon At Scanner  Result Date: 02/20/2020 CLINICAL DATA:  Acute onset upper abdominal pain and nausea today. Elevated liver function tests. Cholelithiasis. EXAM: MRI ABDOMEN WITHOUT AND WITH CONTRAST (INCLUDING MRCP) TECHNIQUE: Multiplanar multisequence MR imaging of the abdomen was performed both before and after the administration of intravenous contrast. Heavily T2-weighted images of the biliary and pancreatic ducts were obtained, and three-dimensional MRCP images were rendered by post processing. CONTRAST:  95mL GADAVIST GADOBUTROL 1 MMOL/ML IV SOLN COMPARISON:  None. FINDINGS: Lower chest: No acute findings. Hepatobiliary: No hepatic masses identified. Numerous small gallstones are seen nearly filling the gallbladder, however there is no evidence of cholecystitis. No evidence of biliary ductal dilatation. Pancreas: Normal appearance. No mass or inflammatory changes seen. No evidence of pancreatic ductal dilatation or pancreas divisum. Spleen:  Within normal limits in size and appearance. Adrenals/Urinary Tract: No masses identified. No evidence of hydronephrosis. Stomach/Bowel: Visualized portion unremarkable. Vascular/Lymphatic: No pathologically enlarged lymph nodes identified. No abdominal aortic aneurysm. Other:  None. Musculoskeletal:  No suspicious bone lesions identified. IMPRESSION: Cholelithiasis. No radiographic evidence of cholecystitis, biliary ductal dilatation, or other acute findings. Electronically Signed   By: Danae Orleans M.D.   On:  02/20/2020 20:52   MR ABDOMEN MRCP W WO CONTAST  Result Date: 02/20/2020 CLINICAL DATA:  Acute onset upper abdominal pain and nausea today. Elevated liver function tests. Cholelithiasis. EXAM: MRI ABDOMEN WITHOUT AND WITH CONTRAST (INCLUDING MRCP) TECHNIQUE: Multiplanar multisequence MR imaging of the abdomen was performed both before and after the administration of intravenous contrast. Heavily  T2-weighted images of the biliary and pancreatic ducts were obtained, and three-dimensional MRCP images were rendered by post processing. CONTRAST:  62mL GADAVIST GADOBUTROL 1 MMOL/ML IV SOLN COMPARISON:  None. FINDINGS: Lower chest: No acute findings. Hepatobiliary: No hepatic masses identified. Numerous small gallstones are seen nearly filling the gallbladder, however there is no evidence of cholecystitis. No evidence of biliary ductal dilatation. Pancreas: Normal appearance. No mass or inflammatory changes seen. No evidence of pancreatic ductal dilatation or pancreas divisum. Spleen:  Within normal limits in size and appearance. Adrenals/Urinary Tract: No masses identified. No evidence of hydronephrosis. Stomach/Bowel: Visualized portion unremarkable. Vascular/Lymphatic: No pathologically enlarged lymph nodes identified. No abdominal aortic aneurysm. Other:  None. Musculoskeletal:  No suspicious bone lesions identified. IMPRESSION: Cholelithiasis. No radiographic evidence of cholecystitis, biliary ductal dilatation, or other acute findings. Electronically Signed   By: Danae Orleans M.D.   On: 02/20/2020 20:52    Anti-infectives: Anti-infectives (From admission, onward)   Start     Dose/Rate Route Frequency Ordered Stop   02/22/20 1500  cefTRIAXone (ROCEPHIN) 2 g in sodium chloride 0.9 % 100 mL IVPB        2 g 200 mL/hr over 30 Minutes Intravenous On call to O.R. 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 Suspect that she passed a gallstone. Planning laparoscopic cholecystectomy today pending OR availability. Keep NPO for  now.  FEN - NPO VTE - lovenox ID - rocephin on call to OR    LOS: 1 day    Franne Forts, [facility] 02/22/2020, 10:35 AM Please see Amion for pager number during day hours 7:00am-4:30pm

## 2020-02-22 NOTE — Anesthesia Preprocedure Evaluation (Addendum)
Anesthesia Evaluation  Patient identified by MRN, date of birth, ID band Patient awake    Reviewed: Allergy & Precautions, NPO status , Patient's Chart, lab work & pertinent test results  History of Anesthesia Complications Negative for: history of anesthetic complications  Airway Mallampati: II  TM Distance: >3 FB Neck ROM: Full    Dental no notable dental hx. (+) Dental Advisory Given   Pulmonary neg pulmonary ROS,    Pulmonary exam normal        Cardiovascular negative cardio ROS Normal cardiovascular exam     Neuro/Psych PSYCHIATRIC DISORDERS Anxiety Depression negative neurological ROS     GI/Hepatic negative GI ROS, Neg liver ROS,   Endo/Other  negative endocrine ROS  Renal/GU negative Renal ROS     Musculoskeletal negative musculoskeletal ROS (+)   Abdominal   Peds  Hematology negative hematology ROS (+)   Anesthesia Other Findings   Reproductive/Obstetrics                            Anesthesia Physical Anesthesia Plan  ASA: II  Anesthesia Plan: General   Post-op Pain Management:    Induction: Intravenous  PONV Risk Score and Plan: 4 or greater and Ondansetron, Dexamethasone, Midazolam and Scopolamine patch - Pre-op  Airway Management Planned: Oral ETT  Additional Equipment:   Intra-op Plan:   Post-operative Plan: Extubation in OR  Informed Consent: I have reviewed the patients History and Physical, chart, labs and discussed the procedure including the risks, benefits and alternatives for the proposed anesthesia with the patient or authorized representative who has indicated his/her understanding and acceptance.     Dental advisory given  Plan Discussed with: Anesthesiologist and CRNA  Anesthesia Plan Comments:        Anesthesia Quick Evaluation

## 2020-02-22 NOTE — Anesthesia Procedure Notes (Signed)
Procedure Name: Intubation Date/Time: 02/22/2020 3:01 PM Performed by: Niel Hummer, CRNA Pre-anesthesia Checklist: Patient identified, Emergency Drugs available, Suction available and Patient being monitored Patient Re-evaluated:Patient Re-evaluated prior to induction Oxygen Delivery Method: Circle system utilized Preoxygenation: Pre-oxygenation with 100% oxygen Induction Type: IV induction Ventilation: Mask ventilation without difficulty Laryngoscope Size: Mac and 4 Grade View: Grade I Tube type: Oral Tube size: 7.0 mm Number of attempts: 1 Airway Equipment and Method: Stylet Placement Confirmation: ETT inserted through vocal cords under direct vision,  positive ETCO2 and breath sounds checked- equal and bilateral Secured at: 23 cm Tube secured with: Tape Dental Injury: Teeth and Oropharynx as per pre-operative assessment

## 2020-02-22 NOTE — Progress Notes (Signed)
PROGRESS NOTE  [name] Rochelle Parillo FBP:[id number] DOB: 1980/07/12 DOA: 02/20/2020 PCP: Deeann Saint, MD  HPI/Recap of past 24 hours: [name] Rochelle Goodwinis a 40 y.o.femalewith medical history significant ofdepression, anxiety, diabetes, GERD, HTN, obesity who presents withsevere sharp epigastric pain radiating bilateral sides to her back. This started suddenly last night. She does admit to a similar episode which was not severe or lasting as long couple of years ago. That resolved spontaneously. During this episode, pain was so bad that patient had to seek care in the emergency department. She denies any fevers, chills, diaphoresis, chest pain, shortness of breath. No vomiting. Currently, her pain has resolved after receiving morphine in the emergency department.  She was admitted for gallstone pancreatitis with elevated liver enzymes.  MRCP showed cholelithiasis without biliary ductal dilatation.  02/22/20: Seen and examined at bedside this morning.  She feels better.  She denies nausea or abdominal pain. Lap chole today.  Assessment/Plan: Principal Problem:   Gallstone pancreatitis Active Problems:   Depression, recurrent (HCC)   Hepatitis  Gallstone pancreatitis  -Lipase 291 -RUQ [place] with numerous gallstones, without cholecystitis -MRCP with cholelithiasis without biliary dilatation LFTs are downtrending Normal T bilirubin No evidence of jaundice. Plan for lap chole today 02/22/2020  Acute transaminitis -Due to above -Hepatitis panel negative, acetaminophen < 10 LFTs are downtrending  Depression -Continue Zoloft  Obesity BMI 31 Recommend weight loss outpatient Patient has had voluntary weight loss Continue healthy dieting and regular physical activity.    DVT prophylaxis:  enoxaparin (LOVENOX) injection 40 mg Start: 02/20/20 2000 Code Status: Full code Family Communication: No family at bedside     Status is: Inpatient   Dispo: The patient  is from: Home              Anticipated d/c is to: Home              Anticipated d/c date is: 02/23/20              Patient currently not stable, plan lap chole on 02/22/20   Difficult to place patient N/A        Objective: Vitals:   02/21/20 2204 02/22/20 0517 02/22/20 1218 02/22/20 1300  BP: 102/65 104/79 (!) 108/58 127/85  Pulse: 100 60 67 78  Resp: 16 18 16 16   Temp: 98.2 F (36.8 C) 98.1 F (36.7 C) 99 F (37.2 C)   TempSrc: Oral Oral Oral Oral  SpO2: 98% 99% 99% 97%  Weight:      Height:        Intake/Output Summary (Last 24 hours) at 02/22/2020 1640 Last data filed at 02/22/2020 1543 Gross per 24 hour  Intake 3184.69 ml  Output --  Net 3184.69 ml   Filed Weights   02/20/20 0432  Weight: 98.4 kg    Exam:  . General: 40 y.o. year-old female well developed well nourished in no acute distress.  Alert and oriented x3. . Cardiovascular: Regular rate and rhythm with no rubs or gallops.  No thyromegaly or JVD noted.   41 Respiratory: Clear to auscultation with no wheezes or rales. Good inspiratory effort. . Abdomen: Soft nontender nondistended with normal bowel sounds x4 quadrants. . Musculoskeletal: No lower extremity edema. 2/4 pulses in all 4 extremities. . Skin: No ulcerative lesions noted or rashes . Psychiatry: Mood is appropriate for condition and setting   Data Reviewed: CBC: Recent Labs  Lab 02/20/20 0434 02/21/20 0251 02/22/20 0325  WBC 4.8 5.3 4.8  NEUTROABS 3.4  --   --  HGB 12.3 10.8* 10.3*  HCT 39.4 34.8* 33.2*  MCV 82.6 84.9 83.6  PLT 326 280 262   Basic Metabolic Panel: Recent Labs  Lab 02/20/20 0434 02/21/20 0251 02/22/20 0325  NA 136 141 141  K 4.0 3.9 3.6  CL 100 108 108  CO2 26 26 25   GLUCOSE 159* 95 104*  BUN 11 8 9   CREATININE 0.77 0.80 0.75  CALCIUM 8.6* 8.2* 8.3*   GFR: Estimated Creatinine Clearance: 118.8 mL/min (by C-G formula based on SCr of 0.75 mg/dL). Liver Function Tests: Recent Labs  Lab 02/20/20 0434  02/21/20 0251 02/22/20 0325  AST 2,704* 598* 217*  ALT 1,075* 660* 390*  ALKPHOS 148* 147* 132*  BILITOT 1.1 0.7 0.4  PROT 7.6 6.0* 6.1*  ALBUMIN 3.8 2.8* 3.0*   Recent Labs  Lab 02/20/20 0434  LIPASE 291*   No results for input(s): AMMONIA in the last 168 hours. Coagulation Profile: No results for input(s): INR, PROTIME in the last 168 hours. Cardiac Enzymes: No results for input(s): CKTOTAL, CKMB, CKMBINDEX, TROPONINI in the last 168 hours. BNP (last 3 results) No results for input(s): PROBNP in the last 8760 hours. HbA1C: No results for input(s): HGBA1C in the last 72 hours. CBG: No results for input(s): GLUCAP in the last 168 hours. Lipid Profile: No results for input(s): CHOL, HDL, LDLCALC, TRIG, CHOLHDL, LDLDIRECT in the last 72 hours. Thyroid Function Tests: No results for input(s): TSH, T4TOTAL, FREET4, T3FREE, THYROIDAB in the last 72 hours. Anemia Panel: No results for input(s): VITAMINB12, FOLATE, FERRITIN, TIBC, IRON, RETICCTPCT in the last 72 hours. Urine analysis:    Component Value Date/Time   COLORURINE YELLOW 02/20/2020 0422   APPEARANCEUR CLEAR 02/20/2020 0422   LABSPEC 1.010 02/20/2020 0422   PHURINE 7.5 02/20/2020 0422   GLUCOSEU NEGATIVE 02/20/2020 0422   HGBUR NEGATIVE 02/20/2020 0422   BILIRUBINUR NEGATIVE 02/20/2020 0422   KETONESUR NEGATIVE 02/20/2020 0422   PROTEINUR NEGATIVE 02/20/2020 0422   UROBILINOGEN 0.2 02/15/2014 1608   NITRITE NEGATIVE 02/20/2020 0422   LEUKOCYTESUR NEGATIVE 02/20/2020 0422   Sepsis Labs: @LABRCNTIP (procalcitonin:4,lacticidven:4)  ) Recent Results (from the past 240 hour(s))  SARS Coronavirus 2 by RT PCR (hospital order, performed in [facility] hospital lab) Nasopharyngeal Nasopharyngeal Swab     Status: None   Collection Time: 02/20/20  6:55 AM   Specimen: Nasopharyngeal Swab  Result Value Ref Range Status   SARS Coronavirus 2 NEGATIVE NEGATIVE Final    Comment: (NOTE) SARS-CoV-2 target nucleic acids are  NOT DETECTED.  The SARS-CoV-2 RNA is generally detectable in upper and lower respiratory specimens during the acute phase of infection. The lowest concentration of SARS-CoV-2 viral copies this assay can detect is 250 copies / mL. A negative result does not preclude SARS-CoV-2 infection and should not be used as the sole basis for treatment or other patient management decisions.  A negative result may occur with improper specimen collection / handling, submission of specimen other than nasopharyngeal swab, presence of viral mutation(s) within the areas targeted by this assay, and inadequate number of viral copies (<250 copies / mL). A negative result must be combined with clinical observations, patient history, and epidemiological information.  Fact Sheet for Patients:    Fact Sheet for Healthcare Providers: [facility]  This test is not yet approved or  cleared by the 02/22/20 FDA and has been authorized for detection and/or diagnosis of SARS-CoV-2 by FDA under an Emergency Use Authorization (EUA).  This EUA will remain in effect (meaning  this test can be used) for the duration of the COVID-19 declaration under Section 564(b)(1) of the Act, 21 U.S.C. section 360bbb-3(b)(1), unless the authorization is terminated or revoked sooner.  Performed at [facility], [address] [place], [place] [address]   Surgical PCR screen     Status: None   Collection Time: 02/22/20  7:26 AM   Specimen: Nasal Mucosa; Nasal Swab  Result Value Ref Range Status   MRSA, PCR NEGATIVE NEGATIVE Final   Staphylococcus aureus NEGATIVE NEGATIVE Final    Comment: (NOTE) The Xpert SA Assay (FDA approved for NASAL specimens in patients 65 years of age and older), is one component of a comprehensive surveillance program. It is not intended to diagnose infection nor to guide or monitor treatment. Performed at [facility], [address], [place], [place] [address]       Studies: DG Cholangiogram Operative  Result Date: 02/22/2020 CLINICAL DATA:  40 year old female with history of cholelithiasis. EXAM: INTRAOPERATIVE CHOLANGIOGRAM TECHNIQUE: Cholangiographic images from the C-arm fluoroscopic device were submitted for interpretation post-operatively. Please see the procedural report for the amount of contrast and the fluoroscopy time utilized. COMPARISON:  None. FINDINGS: Intraoperative cholangiogram with cystic duct injection which demonstrates normal caliber and patent common bile duct and extrahepatic biliary tree. Contrast flows freely into the duodenum. No filling defects are visualized. IMPRESSION: No evidence of choledocholithiasis. Marliss Coots, MD [facility]iology Electronically Signed   By: Marliss Coots MD   On: 02/22/2020 16:37    Scheduled Meds: . [MAR Hold] enoxaparin (LOVENOX) injection  40 mg Subcutaneous Q24H  . scopolamine  1 patch Transdermal Q72H  . scopolamine      . [MAR Hold] sertraline  75 mg Oral Daily    Continuous Infusions: . sodium chloride 100 mL/hr at 02/22/20 0028  . lactated ringers 50 mL/hr at 02/22/20 1341     LOS: 1 day     Darlin Drop, MD [facility] Pager [phone]  If 7PM-7AM, please contact night-coverage [email or web address] Password [facility] 02/22/2020, 4:40 PM

## 2020-02-22 NOTE — H&P (View-Only) (Signed)
[facility] Progress Note  Day of Surgery  Subjective: CC-  No complaints today. Tolerated soft diet yesterday. Denies abdominal pain, nausea, vomiting.  LFTs trending down.  Objective: Vital signs in last 24 hours: Temp:  [98.1 F (36.7 C)-99.7 F (37.6 C)] 98.1 F (36.7 C) (02/02 0517) Pulse Rate:  [60-100] 60 (02/02 0517) Resp:  [16-18] 18 (02/02 0517) BP: (102-116)/(65-81) 104/79 (02/02 0517) SpO2:  [98 %-100 %] 99 % (02/02 0517) Last BM Date: 02/21/20  Intake/Output from previous day: 02/01 0701 - 02/02 0700 In: 3500 [P.O.:1020; I.V.:2480] Out: -  Intake/Output this shift: No intake/output data recorded.  PE: Gen:  Alert, NAD, pleasant Pulm:  rate and effort normal Abd: Soft, NT/ND, +BS, no HSM Psych: A&Ox4  Skin: no rashes noted, warm and dry  Lab Results:  Recent Labs    02/21/20 0251 02/22/20 0325  WBC 5.3 4.8  HGB 10.8* 10.3*  HCT 34.8* 33.2*  PLT 280 262   BMET Recent Labs    02/21/20 0251 02/22/20 0325  NA 141 141  K 3.9 3.6  CL 108 108  CO2 26 25  GLUCOSE 95 104*  BUN 8 9  CREATININE 0.80 0.75  CALCIUM 8.2* 8.3*   PT/INR No results for input(s): LABPROT, INR in the last 72 hours. CMP     Component Value Date/Time   NA 141 02/22/2020 0325   K 3.6 02/22/2020 0325   CL 108 02/22/2020 0325   CO2 25 02/22/2020 0325   GLUCOSE 104 (H) 02/22/2020 0325   BUN 9 02/22/2020 0325   CREATININE 0.75 02/22/2020 0325   CREATININE 0.83 06/29/2017 1353   CREATININE 0.78 07/16/2016 1551   CALCIUM 8.3 (L) 02/22/2020 0325   PROT 6.1 (L) 02/22/2020 0325   ALBUMIN 3.0 (L) 02/22/2020 0325   AST 217 (H) 02/22/2020 0325   AST 30 06/29/2017 1353   ALT 390 (H) 02/22/2020 0325   ALT 42 06/29/2017 1353   ALKPHOS 132 (H) 02/22/2020 0325   BILITOT 0.4 02/22/2020 0325   BILITOT 0.6 06/29/2017 1353   GFRNONAA >60 02/22/2020 0325   GFRNONAA >60 06/29/2017 1353   GFRAA >60 06/29/2017 1353   Lipase     Component Value Date/Time   LIPASE 291  (H) 02/20/2020 0434       Studies/Results: MR 3D Recon At Scanner  Result Date: 02/20/2020 CLINICAL DATA:  Acute onset upper abdominal pain and nausea today. Elevated liver function tests. Cholelithiasis. EXAM: MRI ABDOMEN WITHOUT AND WITH CONTRAST (INCLUDING MRCP) TECHNIQUE: Multiplanar multisequence MR imaging of the abdomen was performed both before and after the administration of intravenous contrast. Heavily T2-weighted images of the biliary and pancreatic ducts were obtained, and three-dimensional MRCP images were rendered by post processing. CONTRAST:  95mL GADAVIST GADOBUTROL 1 MMOL/ML IV SOLN COMPARISON:  None. FINDINGS: Lower chest: No acute findings. Hepatobiliary: No hepatic masses identified. Numerous small gallstones are seen nearly filling the gallbladder, however there is no evidence of cholecystitis. No evidence of biliary ductal dilatation. Pancreas: Normal appearance. No mass or inflammatory changes seen. No evidence of pancreatic ductal dilatation or pancreas divisum. Spleen:  Within normal limits in size and appearance. Adrenals/Urinary Tract: No masses identified. No evidence of hydronephrosis. Stomach/Bowel: Visualized portion unremarkable. Vascular/Lymphatic: No pathologically enlarged lymph nodes identified. No abdominal aortic aneurysm. Other:  None. Musculoskeletal:  No suspicious bone lesions identified. IMPRESSION: Cholelithiasis. No radiographic evidence of cholecystitis, biliary ductal dilatation, or other acute findings. Electronically Signed   By: Danae Orleans M.D.   On:  02/20/2020 20:52   MR ABDOMEN MRCP W WO CONTAST  Result Date: 02/20/2020 CLINICAL DATA:  Acute onset upper abdominal pain and nausea today. Elevated liver function tests. Cholelithiasis. EXAM: MRI ABDOMEN WITHOUT AND WITH CONTRAST (INCLUDING MRCP) TECHNIQUE: Multiplanar multisequence MR imaging of the abdomen was performed both before and after the administration of intravenous contrast. Heavily  T2-weighted images of the biliary and pancreatic ducts were obtained, and three-dimensional MRCP images were rendered by post processing. CONTRAST:  9mL GADAVIST GADOBUTROL 1 MMOL/ML IV SOLN COMPARISON:  None. FINDINGS: Lower chest: No acute findings. Hepatobiliary: No hepatic masses identified. Numerous small gallstones are seen nearly filling the gallbladder, however there is no evidence of cholecystitis. No evidence of biliary ductal dilatation. Pancreas: Normal appearance. No mass or inflammatory changes seen. No evidence of pancreatic ductal dilatation or pancreas divisum. Spleen:  Within normal limits in size and appearance. Adrenals/Urinary Tract: No masses identified. No evidence of hydronephrosis. Stomach/Bowel: Visualized portion unremarkable. Vascular/Lymphatic: No pathologically enlarged lymph nodes identified. No abdominal aortic aneurysm. Other:  None. Musculoskeletal:  No suspicious bone lesions identified. IMPRESSION: Cholelithiasis. No radiographic evidence of cholecystitis, biliary ductal dilatation, or other acute findings. Electronically Signed   By: John A Stahl M.D.   On: 02/20/2020 20:52    Anti-infectives: Anti-infectives (From admission, onward)   Start     Dose/Rate Route Frequency Ordered Stop   02/22/20 1500  cefTRIAXone (ROCEPHIN) 2 g in sodium chloride 0.9 % 100 mL IVPB        2 g 200 mL/hr over 30 Minutes Intravenous On call to O.R. 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 Suspect that she passed a gallstone. Planning laparoscopic cholecystectomy today pending OR availability. Keep NPO for  now.  FEN - NPO VTE - lovenox ID - rocephin on call to OR    LOS: 1 day    Brooke A Meuth, PA-C [facility] 02/22/2020, 10:35 AM Please see Amion for pager number during day hours 7:00am-4:30pm  

## 2020-02-22 NOTE — Interval H&P Note (Signed)
History and Physical Interval Note:  02/22/2020 2:53 PM  [name] [name]  has presented today for surgery, with the diagnosis of biliary colic.  The various methods of treatment have been discussed with the patient and family. After consideration of risks, benefits and other options for treatment, the patient has consented to  Procedure(s): LAPAROSCOPIC CHOLECYSTECTOMY WITH INTRAOPERATIVE CHOLANGIOGRAM (N/A) as a surgical intervention.  The patient's history has been reviewed, patient examined, no change in status, stable for surgery.  I have reviewed the patient's chart and labs.  Questions were answered to the patient's satisfaction.     [name]

## 2020-02-22 NOTE — Op Note (Addendum)
[name] [name]  Primary Care Physician:  Deeann Saint, MD    02/22/2020  5:02 PM  Procedure: Laparoscopic Cholecystectomy with intraoperative cholangiogram  Surgeon: Susy Frizzle B. Daphine Deutscher, MD, FACS Asst:  Bertrum Sol, MD  Anes:  General  Drains:  None  Findings: Chronic choleycstitis with no stones seen on the IOC.  Curtis Fitzhugh bands and a very large contorted gallbladder filled with cholesterol stones  Description of Procedure: The patient was taken to OR 1 and given general anesthesia.  The patient was prepped with chlorohexidine prep and draped sterilely. A time out was performed including identifying the patient and discussing their procedure.  Access to the abdomen was achieved with a 5 mm Optiview through the right upper quadrant.  Port placement included three 5 mm and one 11 mm trocar.    The gallbladder was visualized and appeared very large and with a cap at the indfundibulum.   The fundus of the gallbaldder was grasped and the gallbladder was elevated. Traction on the infundibulum allowed for successful demonstration of the critical view. Inflammatory changes were chronic.  The cystic duct was identified and clipped up on the gallbladder and an incision was made in the cystic duct and the Reddick catheter was inserted after milking the cystic duct of any debris. This was a difficult cannulation and the introducer for the Reddick was required.  A dynamic cholangiogram was performed which demonstrated intrahepatic and distal CBD filling.    The cystic duct was then triple clipped and divided, the cystic artery was double clipped and divided and then the gallbladder was removed from the gallbladder bed. Removal of the gallbladder from the gallbladder bed was without stone spillage.  The gallbladder was then placed in a bag and brought out through the upper trocar site but this was difficult because of the size of the gallbladder and the numbers of stones. The  gallbladder bed was inspected and no bleeding or bile leaks were seen.   Laparoscopic visualization was used when closing fascial defects for trocar sites.   Incisions were injected with Exparel and closed with 4-0 Monocryl and Dermbond on the skin.  Sponge and needle count were correct.    The patient was taken to the recovery room in satisfactory condition.

## 2020-02-22 NOTE — Discharge Instructions (Signed)
[facility] ° °Please arrive at least 30 min before your appointment to complete your check in paperwork.  If you are unable to arrive 30 min prior to your appointment time we may have to cancel or reschedule you. °LAPAROSCOPIC SURGERY: POST OP INSTRUCTIONS °Always review your discharge instruction sheet given to you by the facility where your surgery was performed. °IF YOU HAVE DISABILITY OR FAMILY LEAVE FORMS, YOU MUST BRING THEM TO THE OFFICE FOR PROCESSING.   °DO NOT GIVE THEM TO YOUR DOCTOR. ° °PAIN CONTROL ° °1. First take acetaminophen (Tylenol) AND/or ibuprofen (Advil) to control your pain after surgery.  Follow directions on package.  Taking acetaminophen (Tylenol) and/or ibuprofen (Advil) regularly after surgery will help to control your pain and lower the amount of prescription pain medication you may need.  You should not take more than 4,000 mg (4 grams) of acetaminophen (Tylenol) in 24 hours.  You should not take ibuprofen (Advil), aleve, motrin, naprosyn or other NSAIDS if you have a history of stomach ulcers or chronic kidney disease.  °2. A prescription for pain medication may be given to you upon discharge.  Take your pain medication as prescribed, if you still have uncontrolled pain after taking acetaminophen (Tylenol) or ibuprofen (Advil). °3. Use ice packs to help control pain. °4. If you need a refill on your pain medication, please contact your pharmacy.  They will contact our office to request authorization. Prescriptions will not be filled after 5pm or on week-ends. ° °HOME MEDICATIONS °5. Take your usually prescribed medications unless otherwise directed. ° °DIET °6. You should follow a light diet the first few days after arrival home.  Be sure to include lots of fluids daily. Avoid fatty, fried foods.  ° °CONSTIPATION °7. It is common to experience some constipation after surgery and if you are taking pain medication.  Increasing fluid intake and taking a stool  softener (such as Colace) will usually help or prevent this problem from occurring.  A mild laxative (Milk of Magnesia or Miralax) should be taken according to package instructions if there are no bowel movements after 48 hours. ° °WOUND/INCISION CARE °8. Most patients will experience some swelling and bruising in the area of the incisions.  Ice packs will help.  Swelling and bruising can take several days to resolve.  °9. Unless discharge instructions indicate otherwise, follow guidelines below  °a. STERI-STRIPS - you may remove your outer bandages 48 hours after surgery, and you may shower at that time.  You have steri-strips (small skin tapes) in place directly over the incision.  These strips should be left on the skin for 7-10 days.   °b. DERMABOND/SKIN GLUE - you may shower in 24 hours.  The glue will flake off over the next 2-3 weeks. °10. Any sutures or staples will be removed at the office during your follow-up visit. ° °ACTIVITIES °11. You may resume regular (light) daily activities beginning the next day--such as daily self-care, walking, climbing stairs--gradually increasing activities as tolerated.  You may have sexual intercourse when it is comfortable.  Refrain from any heavy lifting or straining until approved by your doctor. °a. You may drive when you are no longer taking prescription pain medication, you can comfortably wear a seatbelt, and you can safely maneuver your car and apply brakes. ° °FOLLOW-UP °12. You should see your doctor in the office for a follow-up appointment approximately 2-3 weeks after your surgery.  You should have been given your post-op/follow-up appointment when   your surgery was scheduled.  If you did not receive a post-op/follow-up appointment, make sure that you call for this appointment within a day or two after you arrive home to insure a convenient appointment time. ° °OTHER INSTRUCTIONS ° °WHEN TO CALL YOUR DOCTOR: °1. Fever over 101.0 °2. Inability to  urinate °3. Continued bleeding from incision. °4. Increased pain, redness, or drainage from the incision. °5. Increasing abdominal pain ° °The clinic staff is available to answer your questions during regular business hours.  Please don’t hesitate to call and ask to speak to one of the nurses for clinical concerns.  If you have a medical emergency, go to the nearest emergency room or call 911.  A surgeon from [facility] is always on call at the hospital. °[address], [address], [place], [place]  [address] ? [address] [address], [place], [place]   [address] °[phone] ? [phone] ? FAX [phone] ° ° ° °

## 2020-02-22 NOTE — Transfer of Care (Signed)
Immediate Anesthesia Transfer of Care Note  Patient: [name] [name]  Procedure(s) Performed: LAPAROSCOPIC CHOLECYSTECTOMY WITH INTRAOPERATIVE CHOLANGIOGRAM (N/A Abdomen)  Patient Location: PACU  Anesthesia Type:General  Level of Consciousness: awake  Airway & Oxygen Therapy: Patient Spontanous Breathing and Patient connected to face mask oxygen  Post-op Assessment: Report given to RN and Post -op Vital signs reviewed and stable  Post vital signs: Reviewed and stable  Last Vitals:  Vitals Value Taken Time  BP 127/87 02/22/20 1723  Temp    Pulse 99 02/22/20 1725  Resp 20 02/22/20 1725  SpO2 100 % 02/22/20 1725  Vitals shown include unvalidated device data.  Last Pain:  Vitals:   02/22/20 1300  TempSrc: Oral  PainSc:          Complications: No complications documented.

## 2020-02-23 ENCOUNTER — Encounter (HOSPITAL_COMMUNITY): Payer: Self-pay | Admitting: Surgery

## 2020-02-23 DIAGNOSIS — K851 Biliary acute pancreatitis without necrosis or infection: Secondary | ICD-10-CM | POA: Diagnosis not present

## 2020-02-23 LAB — COMPREHENSIVE METABOLIC PANEL
ALT: 316 U/L — ABNORMAL HIGH (ref 0–44)
AST: 146 U/L — ABNORMAL HIGH (ref 15–41)
Albumin: 3.2 g/dL — ABNORMAL LOW (ref 3.5–5.0)
Alkaline Phosphatase: 123 U/L (ref 38–126)
Anion gap: 8 (ref 5–15)
BUN: 6 mg/dL (ref 6–20)
CO2: 22 mmol/L (ref 22–32)
Calcium: 8.3 mg/dL — ABNORMAL LOW (ref 8.9–10.3)
Chloride: 106 mmol/L (ref 98–111)
Creatinine, Ser: 0.77 mg/dL (ref 0.44–1.00)
GFR, Estimated: >60 mL/min (ref >60)
Glucose, Bld: 179 mg/dL — ABNORMAL HIGH (ref 70–99)
Potassium: 3.9 mmol/L (ref 3.5–5.1)
Sodium: 136 mmol/L (ref 135–145)
Total Bilirubin: 0.6 mg/dL (ref 0.3–1.2)
Total Protein: 6.6 g/dL (ref 6.5–8.1)

## 2020-02-23 LAB — CBC
HCT: 34 % — ABNORMAL LOW (ref 36.0–46.0)
Hemoglobin: 10.5 g/dL — ABNORMAL LOW (ref 12.0–15.0)
MCH: 26.1 pg (ref 26.0–34.0)
MCHC: 30.9 g/dL (ref 30.0–36.0)
MCV: 84.6 fL (ref 80.0–100.0)
Platelets: 259 10*3/uL (ref 150–400)
RBC: 4.02 MIL/uL (ref 3.87–5.11)
RDW: 13.6 % (ref 11.5–15.5)
WBC: 12.8 10*3/uL — ABNORMAL HIGH (ref 4.0–10.5)
nRBC: 0 % (ref 0.0–0.2)

## 2020-02-23 MED ORDER — POLYETHYLENE GLYCOL 3350 17 G PO PACK: 17.0000 g | PACK | Freq: Every day | ORAL | 0 refills | Status: DC | PRN

## 2020-02-23 MED ORDER — OXYCODONE HCL 5 MG PO TABS
5.0000 mg | ORAL_TABLET | ORAL | Status: DC | PRN
  Administered 2020-02-23: 5 mg via ORAL
  Filled 2020-02-23: qty 1

## 2020-02-23 MED ORDER — OXYCODONE HCL 5 MG PO TABS: 5.0000 mg | ORAL_TABLET | Freq: Four times a day (QID) | ORAL | 0 refills | Status: DC | PRN

## 2020-02-23 MED ORDER — FUROSEMIDE 10 MG/ML IJ SOLN
20.0000 mg | Freq: Once | INTRAMUSCULAR | Status: AC
  Administered 2020-02-23: 20 mg via INTRAVENOUS
  Filled 2020-02-23: qty 2

## 2020-02-23 MED ORDER — MORPHINE SULFATE (PF) 2 MG/ML IV SOLN: 1.0000 mg | INTRAVENOUS | Status: DC | PRN

## 2020-02-23 NOTE — Progress Notes (Signed)
PT Cancellation Note  Patient Details Name: [name] [name] MRN: [id number] DOB: [birth year]   Cancelled Treatment:    Reason Eval/Treat Not Completed: PT screened, no needs identified, will sign off (Discussed with RN, [name] [name], pt up ambulating in the hallways independently. No mobility needs at this time. Will sign off.)  [name] [name], SPT  Acute rehab   [name] [name] 02/23/2020, 12:02 PM

## 2020-02-23 NOTE — Discharge Summary (Signed)
Discharge Summary  [name] Zeinab [name] YIF:[id number] DOB: 09-10-1980  PCP: Deeann Saint, MD  Admit date: 02/20/2020 Discharge date: 02/23/2020  Time spent: 35 minutes.  Recommendations for Outpatient Follow-up:  1. Follow-up with general surgery in 1 to 2 weeks 2. Follow-up with your primary care provider in 1 to 2 weeks 3. Take your medications as prescribed.  Discharge Diagnoses:  Active Hospital Problems   Diagnosis Date Noted  . Gallstone pancreatitis 02/20/2020  . Hepatitis 02/20/2020  . Depression, recurrent (HCC) 04/04/2019    Resolved Hospital Problems  No resolved problems to display.    Discharge Condition: Stable.  Diet recommendation: Resume previous diet.  Vitals:   02/23/20 0121 02/23/20 0519  BP: 106/81 106/76  Pulse: (!) 101 86  Resp: 16 16  Temp: 98.4 F (36.9 C) 98.1 F (36.7 C)  SpO2: 98% 97%    History of present illness:  [name] Rochelle Goodwinis a 40 y.o.femalewith medical history significant ofchronic anxiety/depression, obesity who presented with sudden onset abdominal pain worse after eating.  In the ER, AST/ALT greater than 1000, T bili normal, lipase 200s, alkaline phosphatase 148.  Right upper quadrant ultrasound showed innumerable stones no evidence of cholecystitis, CBD was normal.  She was admitted for gallstone pancreatitis with elevated liver enzymes. MRCP showed cholelithiasis without biliary ductal dilatation.  General surgery was consulted.  She had laparoscopic cholecystectomy with IOC by Dr. Daphine Deutscher on 02/22/2020.  IOC was negative.  02/23/20:  POD #1 post lap chole on 02/22/2020.  No new complaints.  Positive flatus.  Tolerating a diet well.  LFTs are downtrending.  Vital signs are stable.  Eager to go home.  Hospital Course:  Principal Problem:   Gallstone pancreatitis Active Problems:   Depression, recurrent (HCC)   Hepatitis  Mild gallstone pancreatitis/cholelithiasis/biliary colic status post laparoscopy  cholecystectomy with IOC POD #1.  -Presenting ALT 1075, AST 2074, lipase 291 -RUQ [place] with numerous gallstones, without cholecystitis -MRCPwith cholelithiasis without biliary dilatation -She had laparoscopic cholecystectomy with IOC by Dr. Daphine Deutscher on 02/22/2020.  IOC was negative. -LFTs are downtrending. -T bili is normal. -Pain control in place and bowel regimen PRN. -Follow-up with general surgery outpatient.  Leukocytosis, reactive from surgery.  Afebrile, nonseptic appearing No evidence of active infective process Follow-up with your PCP outpatient.  Improving acute transaminitis -Due to above -Hepatitis panel negative, acetaminophen < 10 -LFTs are downtrending -Follow-up with your PCP and general surgery in 1 to 2 weeks.  Chronic anxiety/depression -Stable. -Continue Zoloft -Follow-up with your PCP.  Improving obesity BMI 31 Recommend to continue weight loss outpatient Patient has had intentional weight loss through healthy dieting and regular physical activity, continue   Follow-up with your PCP.         Code Status:Full code    Procedures:  Lap chole on 02/22/2020.  Consultations:  General surgery.  Discharge Exam: BP 106/76 (BP Location: Right Arm)   Pulse 86   Temp 98.1 F (36.7 C) (Oral)   Resp 16   Ht 5\' 10"  (1.778 m)   Wt 98.4 kg   LMP 02/07/2020   SpO2 97%   BMI 31.14 kg/m  . General: 40 y.o. year-old female well developed well nourished in no acute distress.  Alert and oriented x3. . Cardiovascular: Regular rate and rhythm with no rubs or gallops.  No thyromegaly or JVD noted.   41 Respiratory: Clear to auscultation with no wheezes or rales. Good inspiratory effort. . Abdomen: Soft nontender nondistended with normal bowel sounds x4 quadrants. Marland Kitchen  Musculoskeletal: No lower extremity edema. 2/4 pulses in all 4 extremities. Marland Kitchen Psychiatry: Mood is appropriate for condition and setting  Discharge Instructions You were cared for by a hospitalist  during your hospital stay. If you have any questions about your discharge medications or the care you received while you were in the hospital after you are discharged, you can call the unit and asked to speak with the hospitalist on call if the hospitalist that took care of you is not available. Once you are discharged, your primary care physician will handle any further medical issues. Please note that NO REFILLS for any discharge medications will be authorized once you are discharged, as it is imperative that you return to your primary care physician (or establish a relationship with a primary care physician if you do not have one) for your aftercare needs so that they can reassess your need for medications and monitor your lab values.   Allergies as of 02/23/2020      Reactions   Metformin And Related Itching      Medication List    TAKE these medications   acetaminophen 500 MG tablet Commonly known as: TYLENOL Take 1,000 mg by mouth every 6 (six) hours as needed for moderate pain.   cetirizine 10 MG tablet Commonly known as: ZYRTEC TAKE 1 TABLET BY MOUTH EVERY DAY   hydrocortisone valerate ointment 0.2 % Commonly known as: Westcort Apply 1 application topically 2 (two) times daily. What changed:   when to take this  reasons to take this   oxyCODONE 5 MG immediate release tablet Commonly known as: Oxy IR/ROXICODONE Take 1 tablet (5 mg total) by mouth every 6 (six) hours as needed for severe pain.   polyethylene glycol 17 g packet Commonly known as: MiraLax Take 17 g by mouth daily as needed for mild constipation.   sertraline 50 MG tablet Commonly known as: ZOLOFT TAKE 1 AND 1/2 TABLETS BY MOUTH DAILY      Allergies  Allergen Reactions  . Metformin And Related Itching    Follow-up Information    [facility], [place]. Go on 03/13/2020.   Specialty: General Surgery Why: Your appointment is 02/22 at 10 am Please arrive 30 minutes prior to your appointment to  check in and fill out paperwork. Bring photo ID and insurance information. Contact information: [address] [place] [address] [phone]       Deeann Saint, MD. Call in 1 day(s).   Specialty: Family Medicine Why: Please call for a post hospital follow-up appointment. Contact information: [address] [place] [address] [phone]                The results of significant diagnostics from this hospitalization (including imaging, microbiology, ancillary and laboratory) are listed below for reference.    Significant Diagnostic Studies: DG Cholangiogram Operative  Result Date: 02/22/2020 CLINICAL DATA:  40 year old female with history of cholelithiasis. EXAM: INTRAOPERATIVE CHOLANGIOGRAM TECHNIQUE: Cholangiographic images from the C-arm fluoroscopic device were submitted for interpretation post-operatively. Please see the procedural report for the amount of contrast and the fluoroscopy time utilized. COMPARISON:  None. FINDINGS: Intraoperative cholangiogram with cystic duct injection which demonstrates normal caliber and patent common bile duct and extrahepatic biliary tree. Contrast flows freely into the duodenum. No filling defects are visualized. IMPRESSION: No evidence of choledocholithiasis. Marliss Coots, MD [facility] Electronically Signed   By: Marliss Coots MD   On: 02/22/2020 16:37  MR 3D Recon At Scanner  Result Date: 02/20/2020 CLINICAL DATA:  Acute onset upper abdominal pain and nausea today. Elevated liver function tests. Cholelithiasis. EXAM: MRI ABDOMEN WITHOUT AND WITH CONTRAST (INCLUDING MRCP) TECHNIQUE: Multiplanar multisequence MR imaging of the abdomen was performed both before and after the administration of intravenous contrast. Heavily T2-weighted images of the biliary and pancreatic ducts were obtained, and three-dimensional MRCP images were  rendered by post processing. CONTRAST:  26mL GADAVIST GADOBUTROL 1 MMOL/ML IV SOLN COMPARISON:  None. FINDINGS: Lower chest: No acute findings. Hepatobiliary: No hepatic masses identified. Numerous small gallstones are seen nearly filling the gallbladder, however there is no evidence of cholecystitis. No evidence of biliary ductal dilatation. Pancreas: Normal appearance. No mass or inflammatory changes seen. No evidence of pancreatic ductal dilatation or pancreas divisum. Spleen:  Within normal limits in size and appearance. Adrenals/Urinary Tract: No masses identified. No evidence of hydronephrosis. Stomach/Bowel: Visualized portion unremarkable. Vascular/Lymphatic: No pathologically enlarged lymph nodes identified. No abdominal aortic aneurysm. Other:  None. Musculoskeletal:  No suspicious bone lesions identified. IMPRESSION: Cholelithiasis. No radiographic evidence of cholecystitis, biliary ductal dilatation, or other acute findings. Electronically Signed   By: Danae Orleans M.D.   On: 02/20/2020 20:52   MR ABDOMEN MRCP W WO CONTAST  Result Date: 02/20/2020 CLINICAL DATA:  Acute onset upper abdominal pain and nausea today. Elevated liver function tests. Cholelithiasis. EXAM: MRI ABDOMEN WITHOUT AND WITH CONTRAST (INCLUDING MRCP) TECHNIQUE: Multiplanar multisequence MR imaging of the abdomen was performed both before and after the administration of intravenous contrast. Heavily T2-weighted images of the biliary and pancreatic ducts were obtained, and three-dimensional MRCP images were rendered by post processing. CONTRAST:  57mL GADAVIST GADOBUTROL 1 MMOL/ML IV SOLN COMPARISON:  None. FINDINGS: Lower chest: No acute findings. Hepatobiliary: No hepatic masses identified. Numerous small gallstones are seen nearly filling the gallbladder, however there is no evidence of cholecystitis. No evidence of biliary ductal dilatation. Pancreas: Normal appearance. No mass or inflammatory changes seen. No evidence of  pancreatic ductal dilatation or pancreas divisum. Spleen:  Within normal limits in size and appearance. Adrenals/Urinary Tract: No masses identified. No evidence of hydronephrosis. Stomach/Bowel: Visualized portion unremarkable. Vascular/Lymphatic: No pathologically enlarged lymph nodes identified. No abdominal aortic aneurysm. Other:  None. Musculoskeletal:  No suspicious bone lesions identified. IMPRESSION: Cholelithiasis. No radiographic evidence of cholecystitis, biliary ductal dilatation, or other acute findings. Electronically Signed   By: Danae Orleans M.D.   On: 02/20/2020 20:52   US Abdomen Limited RUQ (LIVER/GB)  Result Date: 02/20/2020 CLINICAL DATA:  Epigastric pain for 1 day with radiation to the right shoulder EXAM: ULTRASOUND ABDOMEN LIMITED RIGHT UPPER QUADRANT COMPARISON:  None similar FINDINGS: Gallbladder: Innumerable shadowing gallstones with measured stone at 7 mm. The gallbladder appears fall but there is no wall thickening, pericholecystic edema, or focal tenderness. Common bile duct: Diameter: 4 mm Liver: No focal lesion identified. Within normal limits in parenchymal echogenicity. Portal vein is patent on color Doppler imaging with normal direction of blood flow towards the liver. IMPRESSION: Numerous gallstones.  No evidence of acute cholecystitis. Electronically Signed   By: Marnee Spring M.D.   On: 02/20/2020 06:35    Microbiology: Recent Results (from the past 240 hour(s))  SARS Coronavirus 2 by RT PCR (hospital order, performed in [facility] hospital lab) Nasopharyngeal Nasopharyngeal Swab     Status: None   Collection Time: 02/20/20  6:55 AM   Specimen: Nasopharyngeal Swab  Result Value Ref Range Status   SARS Coronavirus 2 NEGATIVE NEGATIVE Final  Comment: (NOTE) SARS-CoV-2 target nucleic acids are NOT DETECTED.  The SARS-CoV-2 RNA is generally detectable in upper and lower respiratory specimens during the acute phase of infection. The lowest concentration of  SARS-CoV-2 viral copies this assay can detect is 250 copies / mL. A negative result does not preclude SARS-CoV-2 infection and should not be used as the sole basis for treatment or other patient management decisions.  A negative result may occur with improper specimen collection / handling, submission of specimen other than nasopharyngeal swab, presence of viral mutation(s) within the areas targeted by this assay, and inadequate number of viral copies (<250 copies / mL). A negative result must be combined with clinical observations, patient history, and epidemiological information.  Fact Sheet for Patients:   BoilerBrush.com.cy  Fact Sheet for Healthcare Providers: https://pope.com/  This test is not yet approved or  cleared by the [place] FDA and has been authorized for detection and/or diagnosis of SARS-CoV-2 by FDA under an Emergency Use Authorization (EUA).  This EUA will remain in effect (meaning this test can be used) for the duration of the COVID-19 declaration under Section 564(b)(1) of the Act, 21 U.S.C. section 360bbb-3(b)(1), unless the authorization is terminated or revoked sooner.  Performed at [facility], [address], [place], [place] [address]   Surgical PCR screen     Status: None   Collection Time: 02/22/20  7:26 AM   Specimen: Nasal Mucosa; Nasal Swab  Result Value Ref Range Status   MRSA, PCR NEGATIVE NEGATIVE Final   Staphylococcus aureus NEGATIVE NEGATIVE Final    Comment: (NOTE) The Xpert SA Assay (FDA approved for NASAL specimens in patients 51 years of age and older), is one component of a comprehensive surveillance program. It is not intended to diagnose infection nor to guide or monitor treatment. Performed at [facility], [address], [place], [place] [address]      Labs: Basic Metabolic Panel: Recent Labs  Lab 02/20/20 0434 02/21/20 0251  02/22/20 0325 02/23/20 0612  NA 136 141 141 136  K 4.0 3.9 3.6 3.9  CL 100 108 108 106  CO2 26 26 25 22   GLUCOSE 159* 95 104* 179*  BUN 11 8 9 6   CREATININE 0.77 0.80 0.75 0.77  CALCIUM 8.6* 8.2* 8.3* 8.3*   Liver Function Tests: Recent Labs  Lab 02/20/20 0434 02/21/20 0251 02/22/20 0325 02/23/20 0612  AST 2,704* 598* 217* 146*  ALT 1,075* 660* 390* 316*  ALKPHOS 148* 147* 132* 123  BILITOT 1.1 0.7 0.4 0.6  PROT 7.6 6.0* 6.1* 6.6  ALBUMIN 3.8 2.8* 3.0* 3.2*   Recent Labs  Lab 02/20/20 0434  LIPASE 291*   No results for input(s): AMMONIA in the last 168 hours. CBC: Recent Labs  Lab 02/20/20 0434 02/21/20 0251 02/22/20 0325 02/23/20 0612  WBC 4.8 5.3 4.8 12.8*  NEUTROABS 3.4  --   --   --   HGB 12.3 10.8* 10.3* 10.5*  HCT 39.4 34.8* 33.2* 34.0*  MCV 82.6 84.9 83.6 84.6  PLT 326 280 262 259   Cardiac Enzymes: No results for input(s): CKTOTAL, CKMB, CKMBINDEX, TROPONINI in the last 168 hours. BNP: BNP (last 3 results) No results for input(s): BNP in the last 8760 hours.  ProBNP (last 3 results) No results for input(s): PROBNP in the last 8760 hours.  CBG: Recent Labs  Lab 02/22/20 1752  GLUCAP 135*       Signed:  Darlin Drop, MD [facility] 02/23/2020,  12:00 PM

## 2020-02-23 NOTE — Progress Notes (Signed)
[facility] Progress Note  1 Day Post-Op  Subjective: CC-  Up in chair this morning. Abdomen sore but pain well controlled. Tolerating liquids. Denies n/v. Passing flatus, no BM.   Objective: Vital signs in last 24 hours: Temp:  [97.8 F (36.6 C)-99 F (37.2 C)] 98.1 F (36.7 C) (02/03 0519) Pulse Rate:  [67-101] 86 (02/03 0519) Resp:  [10-19] 16 (02/03 0519) BP: (106-132)/(58-94) 106/76 (02/03 0519) SpO2:  [94 %-100 %] 97 % (02/03 0519) Last BM Date: 02/21/20  Intake/Output from previous day: 02/02 0701 - 02/03 0700 In: 2740 [P.O.:240; I.V.:2400; IV Piggyback:100] Out: 200 [Urine:100; Blood:100] Intake/Output this shift: No intake/output data recorded.  PE: Gen:  Alert, NAD, pleasant Pulm:  rate and effort normal Abd: Soft, ND, appropriately tender, lap incisions cdi, +BS, no HSM Psych: A&Ox4  Skin: no rashes noted, warm and dry   Lab Results:  Recent Labs    02/22/20 0325 02/23/20 0612  WBC 4.8 12.8*  HGB 10.3* 10.5*  HCT 33.2* 34.0*  PLT 262 259   BMET Recent Labs    02/22/20 0325 02/23/20 0612  NA 141 136  K 3.6 3.9  CL 108 106  CO2 25 22  GLUCOSE 104* 179*  BUN 9 6  CREATININE 0.75 0.77  CALCIUM 8.3* 8.3*   PT/INR No results for input(s): LABPROT, INR in the last 72 hours. CMP     Component Value Date/Time   NA 136 02/23/2020 0612   K 3.9 02/23/2020 0612   CL 106 02/23/2020 0612   CO2 22 02/23/2020 0612   GLUCOSE 179 (H) 02/23/2020 0612   BUN 6 02/23/2020 0612   CREATININE 0.77 02/23/2020 0612   CREATININE 0.83 06/29/2017 1353   CREATININE 0.78 07/16/2016 1551   CALCIUM 8.3 (L) 02/23/2020 0612   PROT 6.6 02/23/2020 0612   ALBUMIN 3.2 (L) 02/23/2020 0612   AST 146 (H) 02/23/2020 0612   AST 30 06/29/2017 1353   ALT 316 (H) 02/23/2020 0612   ALT 42 06/29/2017 1353   ALKPHOS 123 02/23/2020 0612   BILITOT 0.6 02/23/2020 0612   BILITOT 0.6 06/29/2017 1353   GFRNONAA >60 02/23/2020 0612   GFRNONAA >60 06/29/2017 1353    GFRAA >60 06/29/2017 1353   Lipase     Component Value Date/Time   LIPASE 291 (H) 02/20/2020 0434       Studies/Results: DG Cholangiogram Operative  Result Date: 02/22/2020 CLINICAL DATA:  40 year old female with history of cholelithiasis. EXAM: INTRAOPERATIVE CHOLANGIOGRAM TECHNIQUE: Cholangiographic images from the C-arm fluoroscopic device were submitted for interpretation post-operatively. Please see the procedural report for the amount of contrast and the fluoroscopy time utilized. COMPARISON:  None. FINDINGS: Intraoperative cholangiogram with cystic duct injection which demonstrates normal caliber and patent common bile duct and extrahepatic biliary tree. Contrast flows freely into the duodenum. No filling defects are visualized. IMPRESSION: No evidence of choledocholithiasis. Marliss Coots, MD [facility] Radiology Electronically Signed   By: Marliss Coots MD   On: 02/22/2020 16:37    Anti-infectives: Anti-infectives (From admission, onward)   Start     Dose/Rate Route Frequency Ordered Stop   02/22/20 1500  cefTRIAXone (ROCEPHIN) 2 g in sodium chloride 0.9 % 100 mL IVPB        2 g 200 mL/hr over 30 Minutes Intravenous On call to O.R. 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 Daphine Deutscher - POD#1 - IOC negative - Advance to regular diet. D/c IVF. Ok for discharge from surgical standpoint. Discharge instructions and follow up info on AVS. I will send rx for oxy to her pharmacy.  FEN -reg diet VTE -lovenox ID -rocephin periop Follow up - [facility]   LOS: 2 days     Franne Forts, [facility] 02/23/2020, 9:07 AM Please see Amion for pager number during day hours 7:00am-4:30pm

## 2020-02-23 NOTE — Plan of Care (Signed)
Patient discharged home in stable condition 

## 2020-02-23 NOTE — Anesthesia Postprocedure Evaluation (Signed)
Anesthesia Post Note  Patient: [name] [name]  Procedure(s) Performed: LAPAROSCOPIC CHOLECYSTECTOMY WITH INTRAOPERATIVE CHOLANGIOGRAM (N/A Abdomen)     Patient location during evaluation: PACU Anesthesia Type: General Level of consciousness: sedated Pain management: pain level controlled Vital Signs Assessment: post-procedure vital signs reviewed and stable Respiratory status: spontaneous breathing and respiratory function stable Cardiovascular status: stable Postop Assessment: no apparent nausea or vomiting Anesthetic complications: no   No complications documented.                Tasfia Vasseur DANIEL

## 2020-02-24 LAB — SURGICAL PATHOLOGY

## 2020-07-12 LAB — HM MAMMOGRAPHY

## 2020-07-17 ENCOUNTER — Encounter: Payer: Self-pay | Admitting: Family Medicine

## 2020-08-30 ENCOUNTER — Other Ambulatory Visit: Payer: Self-pay

## 2020-08-31 ENCOUNTER — Encounter: Payer: Self-pay | Admitting: Family Medicine

## 2020-08-31 ENCOUNTER — Ambulatory Visit (INDEPENDENT_AMBULATORY_CARE_PROVIDER_SITE_OTHER): Payer: Managed Care, Other (non HMO) | Admitting: Family Medicine

## 2020-08-31 VITALS — BP 128/100 | HR 80 | Temp 98.2°F | Ht 69.5 in | Wt 225.0 lb

## 2020-08-31 DIAGNOSIS — R195 Other fecal abnormalities: Secondary | ICD-10-CM | POA: Diagnosis not present

## 2020-08-31 DIAGNOSIS — Z9884 Bariatric surgery status: Secondary | ICD-10-CM

## 2020-08-31 DIAGNOSIS — R5383 Other fatigue: Secondary | ICD-10-CM

## 2020-08-31 DIAGNOSIS — Z0000 Encounter for general adult medical examination without abnormal findings: Secondary | ICD-10-CM | POA: Diagnosis not present

## 2020-08-31 DIAGNOSIS — F411 Generalized anxiety disorder: Secondary | ICD-10-CM

## 2020-08-31 DIAGNOSIS — R030 Elevated blood-pressure reading, without diagnosis of hypertension: Secondary | ICD-10-CM

## 2020-08-31 DIAGNOSIS — R635 Abnormal weight gain: Secondary | ICD-10-CM | POA: Diagnosis not present

## 2020-08-31 DIAGNOSIS — F320 Major depressive disorder, single episode, mild: Secondary | ICD-10-CM

## 2020-08-31 NOTE — Progress Notes (Signed)
Subjective:     [name] [name] is a 40 y.o. female and is here for a comprehensive physical exam. Patient endorses increased fatigue.  Concerned about anemia.  Patient status post gastric bypass with history of absorption issues.  Taking daily multivitamin.  Endorses increased anxiety and depression symptoms that are starting to affect daily life.  In counseling.  IVF was unsuccessful.  Had mammogram 07/12/2020.  Social History   Socioeconomic History   Marital status: Married    Spouse name: Not on file   Number of children: 0   Years of education: Not on file   Highest education level: Not on file  Occupational History   Occupation: Educator  Tobacco Use   Smoking status: Never   Smokeless tobacco: Never  Vaping Use   Vaping Use: Never used  Substance and Sexual Activity   Alcohol use: No    Alcohol/week: 0.0 standard drinks   Drug use: No   Sexual activity: Yes    Birth control/protection: Condom    Comment: INTERCOUSRE AGE 54, MORE THAN 5 SEXUAL PARTNERS  Other Topics Concern   Not on file  Social History Narrative   Not on file   Social Determinants of Health   Financial Resource Strain: Not on file  Food Insecurity: Not on file  Transportation Needs: Not on file  Physical Activity: Not on file  Stress: Not on file  Social Connections: Not on file  Intimate Partner Violence: Not on file   Health Maintenance  Topic Date Due   FOOT EXAM  Never done   OPHTHALMOLOGY EXAM  Never done   URINE MICROALBUMIN  Never done   HEMOGLOBIN A1C  08/09/2017   INFLUENZA VACCINE  08/20/2020   PAP SMEAR-Modifier  02/19/2022 (Originally 02/10/2020)   TETANUS/TDAP  02/07/2022   PNEUMOCOCCAL POLYSACCHARIDE VACCINE AGE 50-64 HIGH RISK  Completed   COVID-19 Vaccine  Completed   Hepatitis C Screening  Completed   HIV Screening  Completed   Pneumococcal Vaccine 35-40 Years old  Aged Out   HPV VACCINES  Aged Out    The following portions of the patient's history were reviewed  and updated as appropriate: allergies, current medications, past family history, past medical history, past social history, past surgical history, and problem list.  Review of Systems Pertinent items noted in HPI and remainder of comprehensive ROS otherwise negative.   Objective:    BP (!) 128/100 (BP Location: Right Arm, Patient Position: Sitting, Cuff Size: Normal)   Pulse 80   Temp 98.2 F (36.8 C) (Oral)   Ht 5' 9.5" (1.765 m)   Wt 225 lb (102.1 kg)   SpO2 96%   BMI 32.75 kg/m  General appearance: alert, cooperative, fatigued, and no distress Head: Normocephalic, without obvious abnormality, atraumatic Eyes: conjunctivae/corneas clear. PERRL, EOM's intact. Fundi benign. Ears: normal TM's and external ear canals both ears Nose: Nares normal. Septum midline. Mucosa normal. No drainage or sinus tenderness. Throat: lips, mucosa, and tongue normal; teeth and gums normal Neck: no adenopathy, no carotid bruit, no JVD, supple, symmetrical, trachea midline, and thyroid not enlarged, symmetric, no tenderness/mass/nodules Lungs: clear to auscultation bilaterally Heart: regular rate and rhythm, S1, S2 normal, no murmur, click, rub or gallop Abdomen: soft, non-tender; bowel sounds normal; no masses,  no organomegaly Extremities: extremities normal, atraumatic, no cyanosis or edema Pulses: 2+ and symmetric Skin: Skin color, texture, turgor normal. No rashes or lesions Lymph nodes: Cervical, supraclavicular, and axillary nodes normal. Neurologic: Alert and oriented X 3, normal strength  and tone. Normal symmetric reflexes. Normal coordination and gait    Assessment:    Healthy female exam.      Plan:    Anticipatory guidance given including wearing seatbelts, smoke detectors in the home, increasing physical activity, increasing p.o. intake of water and vegetables. -Obtain labs -Mammogram up-to-date, done 07/12/2020 -Discussed immunizations such as influenza vaccine when available -Next  CPE in 1 year  Elevated blood pressure reading without diagnosis of hypertension -Recheck BP -Discussed obtaining BP cuff and monitoring blood pressure at home -Lifestyle modifications - Plan: CMP  History of Roux-en-Y gastric bypass  - Plan: Vitamin B12, Vitamin D, 25-hydroxy, Iron, TIBC and Ferritin Panel, Microalbumin/Creatinine Ratio, Urine  Weight gain -Patient actually lost 11 pounds since last year.  Now 225 lbs.  Was 236 lbs on 08/03/2019 -Depression likely contributing.  Also consider thyroid dysfunction -Continue lifestyle modifications including increasing physical activity - Plan: TSH, T4, Free, Microalbumin/Creatinine Ratio, Urine  Loose stools -Anxiety possibly contributing.  Also consider thyroid dysfunction. -Obtain labs - Plan: TSH, T4, Free  Fatigue, unspecified type -We will obtain labs to evaluate for anemia, iron deficiency, thyroid dysfunction.  Depression anxiety likely also contributing -Lifestyle modifications encouraged such as increasing physical activity - Plan: CBC with Differential/Platelet, TSH, T4, Free, Hemoglobin A1c, Vitamin B12, Vitamin D, 25-hydroxy, Iron, TIBC and Ferritin Panel, CMP  Depression, major, single episode, mild (HCC) -PHQ-9 score 11 -Continue counseling and medication -Discussed mindfulness and self -Given strict precautions -Continue to monitor  GAD (generalized anxiety disorder) -GAD-7 score 7 -Continue counseling and medication -Discussed mindfulness and self-care  - Plan: TSH, T4, Free  Follow-up in 4 weeks, sooner if needed  Abbe Amsterdam, MD

## 2020-09-03 ENCOUNTER — Telehealth: Payer: Self-pay

## 2020-09-03 ENCOUNTER — Other Ambulatory Visit: Payer: Self-pay | Admitting: Family Medicine

## 2020-09-03 DIAGNOSIS — D508 Other iron deficiency anemias: Secondary | ICD-10-CM

## 2020-09-03 DIAGNOSIS — E559 Vitamin D deficiency, unspecified: Secondary | ICD-10-CM

## 2020-09-03 DIAGNOSIS — E118 Type 2 diabetes mellitus with unspecified complications: Secondary | ICD-10-CM

## 2020-09-03 MED ORDER — FERROUS GLUCONATE 324 (38 FE) MG PO TABS: 324.0000 mg | ORAL_TABLET | Freq: Every day | ORAL | 3 refills | Status: DC

## 2020-09-03 MED ORDER — VITAMIN D (ERGOCALCIFEROL) 1.25 MG (50000 UNIT) PO CAPS: 50000.0000 [IU] | ORAL_CAPSULE | ORAL | 0 refills | Status: DC

## 2020-09-04 NOTE — Telephone Encounter (Signed)
Message sent to Dr Salomon [facility]

## 2020-09-05 ENCOUNTER — Encounter: Payer: Self-pay | Admitting: Family Medicine

## 2020-09-05 LAB — IRON,TIBC AND FERRITIN PANEL
%SAT: 2 % (calc) — ABNORMAL LOW (ref 16–45)
Ferritin: 4 ng/mL — ABNORMAL LOW (ref 16–154)
Iron: 12 ug/dL — ABNORMAL LOW (ref 40–190)
TIBC: 561 mcg/dL (calc) — ABNORMAL HIGH (ref 250–450)

## 2020-09-05 LAB — CBC WITH DIFFERENTIAL/PLATELET
Absolute Monocytes: 728 cells/uL (ref 200–950)
Basophils Absolute: 33 cells/uL (ref 0–200)
Basophils Relative: 0.5 %
Eosinophils Absolute: 169 cells/uL (ref 15–500)
Eosinophils Relative: 2.6 %
HCT: 34.6 % — ABNORMAL LOW (ref 35.0–45.0)
Hemoglobin: 9.8 g/dL — ABNORMAL LOW (ref 11.7–15.5)
Lymphs Abs: 2210 cells/uL (ref 850–3900)
MCH: 21.3 pg — ABNORMAL LOW (ref 27.0–33.0)
MCHC: 28.3 g/dL — ABNORMAL LOW (ref 32.0–36.0)
MCV: 75.1 fL — ABNORMAL LOW (ref 80.0–100.0)
MPV: 9.2 fL (ref 7.5–12.5)
Monocytes Relative: 11.2 %
Neutro Abs: 3361 cells/uL (ref 1500–7800)
Neutrophils Relative %: 51.7 %
Platelets: 442 10*3/uL — ABNORMAL HIGH (ref 140–400)
RBC: 4.61 10*6/uL (ref 3.80–5.10)
RDW: 15.7 % — ABNORMAL HIGH (ref 11.0–15.0)
Total Lymphocyte: 34 %
WBC: 6.5 10*3/uL (ref 3.8–10.8)

## 2020-09-05 LAB — VITAMIN D 25 HYDROXY (VIT D DEFICIENCY, FRACTURES): Vit D, 25-Hydroxy: 24 ng/mL — ABNORMAL LOW (ref 30–100)

## 2020-09-05 LAB — LIPID PANEL
Cholesterol: 170 mg/dL (ref <200)
HDL: 85 mg/dL (ref >=50)
LDL Cholesterol (Calc): 71 mg/dL (calc)
Non-HDL Cholesterol (Calc): 85 mg/dL (calc) (ref <130)
Total CHOL/HDL Ratio: 2 (calc) (ref <5.0)
Triglycerides: 62 mg/dL (ref <150)

## 2020-09-05 LAB — COMPREHENSIVE METABOLIC PANEL
AG Ratio: 1.1 (calc) (ref 1.0–2.5)
ALT: 19 U/L (ref 6–29)
AST: 24 U/L (ref 10–30)
Albumin: 3.9 g/dL (ref 3.6–5.1)
Alkaline phosphatase (APISO): 76 U/L (ref 31–125)
BUN: 9 mg/dL (ref 7–25)
CO2: 28 mmol/L (ref 20–32)
Calcium: 9.1 mg/dL (ref 8.6–10.2)
Chloride: 102 mmol/L (ref 98–110)
Creat: 0.86 mg/dL (ref 0.50–0.99)
Globulin: 3.4 g/dL (calc) (ref 1.9–3.7)
Glucose, Bld: 106 mg/dL — ABNORMAL HIGH (ref 65–99)
Potassium: 4.4 mmol/L (ref 3.5–5.3)
Sodium: 136 mmol/L (ref 135–146)
Total Bilirubin: 0.2 mg/dL (ref 0.2–1.2)
Total Protein: 7.3 g/dL (ref 6.1–8.1)

## 2020-09-05 LAB — T4, FREE: Free T4: 1 ng/dL (ref 0.8–1.8)

## 2020-09-05 LAB — MICROALBUMIN / CREATININE URINE RATIO
Creatinine, Urine: 172 mg/dL (ref 20–275)
Microalb Creat Ratio: 1 mcg/mg creat (ref <30)
Microalb, Ur: 0.2 mg/dL

## 2020-09-05 LAB — VITAMIN B12: Vitamin B-12: 371 pg/mL (ref 200–1100)

## 2020-09-05 LAB — HEMOGLOBIN A1C
Hgb A1c MFr Bld: 6.7 % of total Hgb — ABNORMAL HIGH (ref <5.7)
Mean Plasma Glucose: 146 mg/dL
eAG (mmol/L): 8.1 mmol/L

## 2020-09-05 LAB — TSH: TSH: 1.78 mIU/L

## 2020-09-10 ENCOUNTER — Other Ambulatory Visit: Payer: Self-pay | Admitting: Family Medicine

## 2020-09-10 ENCOUNTER — Encounter: Payer: Self-pay | Admitting: Family Medicine

## 2020-09-10 DIAGNOSIS — L309 Dermatitis, unspecified: Secondary | ICD-10-CM

## 2020-09-14 ENCOUNTER — Telehealth: Payer: Self-pay | Admitting: Family Medicine

## 2020-09-14 NOTE — Telephone Encounter (Signed)
Fax resent, confirmation rec'd.

## 2020-09-14 NOTE — Telephone Encounter (Signed)
[name] Futures trader)  from the patient care system beside [name] [name] is requesting to be sent another order. She stated that the order that was previously sent didn't have everything (including date) filled out.  Please advise.

## 2020-09-18 ENCOUNTER — Other Ambulatory Visit: Payer: Self-pay

## 2020-09-18 ENCOUNTER — Non-Acute Institutional Stay (HOSPITAL_COMMUNITY)
Admission: RE | Admit: 2020-09-18 | Discharge: 2020-09-18 | Disposition: A | Discharge status: Home / Self Care | Payer: Managed Care, Other (non HMO) | Source: Ambulatory Visit | Attending: Internal Medicine | Admitting: Internal Medicine

## 2020-09-18 DIAGNOSIS — Z9884 Bariatric surgery status: Secondary | ICD-10-CM | POA: Diagnosis not present

## 2020-09-18 DIAGNOSIS — D649 Anemia, unspecified: Secondary | ICD-10-CM | POA: Diagnosis present

## 2020-09-18 MED ORDER — SODIUM CHLORIDE 0.9 % IV SOLN
INTRAVENOUS | Status: DC | PRN
  Administered 2020-09-18: 250 mL via INTRAVENOUS

## 2020-09-18 MED ORDER — SODIUM CHLORIDE 0.9 % IV SOLN
510.0000 mg | Freq: Once | INTRAVENOUS | Status: AC
  Administered 2020-09-18: 510 mg via INTRAVENOUS
  Filled 2020-09-18: qty 510

## 2020-09-18 NOTE — Progress Notes (Signed)
PATIENT CARE CENTER NOTE   Diagnosis: Anemia not corrected with oral iron Dx 064.9   Provider: Abbe Amsterdam, MD   Procedure:  Feraheme infusion   Note: Patient received Feraheme infusion (1 of 1) via PIV. Tolerated well with no adverse reaction. Observed patient for 30 minutes post infusion. Vital signs stable. AVS offered but patient refused. Patient alert, oriented and ambulatory at discharge.

## 2020-10-08 ENCOUNTER — Telehealth: Payer: Self-pay | Admitting: Family Medicine

## 2020-10-08 NOTE — Telephone Encounter (Signed)
Order faxed.

## 2020-10-08 NOTE — Telephone Encounter (Signed)
[name] [name] from the Patient Care Center called today because the patient has an appointment for an iron infusion in the morning. She states that there are no orders and she is wanting Dr. Salomon Fick to put in orders before the patient's appointment tomorrow morning.  She says that it can be done through Epic or through fax at [phone].  Please advise.

## 2020-10-09 ENCOUNTER — Non-Acute Institutional Stay (HOSPITAL_COMMUNITY)
Admission: RE | Admit: 2020-10-09 | Discharge: 2020-10-09 | Disposition: A | Discharge status: Home / Self Care | Payer: Managed Care, Other (non HMO) | Source: Ambulatory Visit | Attending: Internal Medicine | Admitting: Internal Medicine

## 2020-10-09 ENCOUNTER — Other Ambulatory Visit: Payer: Self-pay

## 2020-10-09 DIAGNOSIS — D649 Anemia, unspecified: Secondary | ICD-10-CM | POA: Insufficient documentation

## 2020-10-09 DIAGNOSIS — Z9884 Bariatric surgery status: Secondary | ICD-10-CM | POA: Diagnosis not present

## 2020-10-09 MED ORDER — SODIUM CHLORIDE 0.9 % IV SOLN
510.0000 mg | Freq: Once | INTRAVENOUS | Status: AC
  Administered 2020-10-09: 510 mg via INTRAVENOUS
  Filled 2020-10-09: qty 510

## 2020-10-09 MED ORDER — SODIUM CHLORIDE 0.9 % IV SOLN
INTRAVENOUS | Status: DC | PRN
  Administered 2020-10-09: 250 mL via INTRAVENOUS

## 2020-10-09 NOTE — Progress Notes (Signed)
PATIENT CARE CENTER NOTE     Diagnosis: Iron deficiency anemia, Anemia not corrected with oral iron Dx 064.9     Provider: Abbe Amsterdam, MD     Procedure:  Feraheme infusion     Note: Patient received Feraheme infusion (1 of 1) via PIV. Tolerated well with no adverse reaction. Observed patient for 30 minutes post infusion. Vital signs stable. AVS offered but patient refused. Patient alert, oriented and ambulatory at discharge.

## 2020-10-09 NOTE — Telephone Encounter (Signed)
[name] [name] from patient care rec'd the orders.

## 2020-10-18 ENCOUNTER — Ambulatory Visit: Payer: Managed Care, Other (non HMO) | Admitting: Gastroenterology

## 2020-10-18 ENCOUNTER — Encounter: Payer: Self-pay | Admitting: Gastroenterology

## 2020-10-18 VITALS — BP 130/76 | HR 94 | Ht 69.5 in | Wt 226.2 lb

## 2020-10-18 DIAGNOSIS — R140 Abdominal distension (gaseous): Secondary | ICD-10-CM | POA: Diagnosis not present

## 2020-10-18 DIAGNOSIS — K529 Noninfective gastroenteritis and colitis, unspecified: Secondary | ICD-10-CM

## 2020-10-18 DIAGNOSIS — R143 Flatulence: Secondary | ICD-10-CM

## 2020-10-18 MED ORDER — HYOSCYAMINE SULFATE 0.125 MG SL SUBL: 0.1250 mg | SUBLINGUAL_TABLET | Freq: Four times a day (QID) | SUBLINGUAL | 1 refills | Status: DC | PRN

## 2020-10-18 MED ORDER — METRONIDAZOLE 500 MG PO TABS: 500.0000 mg | ORAL_TABLET | Freq: Two times a day (BID) | ORAL | 0 refills | Status: AC

## 2020-10-18 NOTE — Progress Notes (Signed)
[facility] Progress Note  Chief Complaint: Diarrhea and flatulence  Subjective  History:  From my 04/08/2018 office note: "Most recently seen March 2019 for chronic iron deficiency anemia along with bloating gas and intermittent diarrhea.  She has received IV iron infusions with significant improvement in her iron levels, hemoglobin and fatigue.  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 According to recent primary care note, [name] [name] has been under increased stress recently because her husband unexpectedly required heart valve surgery.   [name] [name] has had bloating and gas with some urgency for BMs.  The diarrhea is not as bad as it was before, and is often formed.  She has some urgency and then might not have a BM.  She feels there is a lot of gas with abdominal rumbling and noise.  She has continued taking iron tablets daily" I thought there was an element of IBS, hyoscyamine prescribed. ____________________  [name] [name] came back to see me for recurrence of diarrhea, bloating and flatulence.  She seems to recall that the hyoscyamine prescribed at the last visit had helped, and eventually she did not feel she needed it anymore.  She was fairly well until earlier this year when she had cholecystectomy for gallstone pancreatitis.  Since then, she has had frequent episodes of loose stool with urgency, bloating and gas, though there may be days when she feels well but cannot explain why.  She has not noticed any clear or consistent food triggers. She was feeling fatigued again and labs showed she had IDA, so recently got 510 mg Feraheme infusions on August 30 and September 20.   ROS: Cardiovascular:  no chest pain Respiratory: no dyspnea Remainder of systems negative except as above  The patient's Past Medical, Family and Social History were reviewed and are on file in the EMR. Past Medical History:  Diagnosis Date   Anxiety     Depression    Diabetes mellitus 05/2008   DR. BALAN   GERD (gastroesophageal reflux disease)    after taking antibiotics it went away   Hypertension    Obesity    Past Surgical History:  Procedure Laterality Date   BREATH TEK H PYLORI N/A 08/20/2012   Procedure: BREATH TEK H PYLORI;  Surgeon: Lodema Pilot, DO;  Location: [facility];  Service: Endoscopy;  Laterality: N/A;   CHOLECYSTECTOMY N/A 02/22/2020   Procedure: LAPAROSCOPIC CHOLECYSTECTOMY WITH INTRAOPERATIVE CHOLANGIOGRAM;  Surgeon: Luretha Murphy, MD;  Location: [facility];  Service: General;  Laterality: N/A;   GASTRIC ROUX-EN-Y N/A 08/24/2012   Procedure: LAPAROSCOPIC ROUX-EN-Y GASTRIC BYPASS WITH UPPER ENDOSCOPY;  Surgeon: Lodema Pilot, DO;  Location: [facility];  Service: General;  Laterality: N/A;    Objective:  Med list reviewed  Current Outpatient Medications:    acetaminophen (TYLENOL) 500 MG tablet, Take 1,000 mg by mouth every 6 (six) hours as needed for moderate pain., Disp: , Rfl:    cetirizine (ZYRTEC) 10 MG tablet, TAKE 1 TABLET BY MOUTH EVERY DAY (Patient taking differently: Take 10 mg by mouth daily.), Disp: 90 tablet, Rfl: 0   ferrous gluconate (FERGON) 324 MG tablet, Take 1 tablet (324 mg total) by mouth daily with breakfast., Disp: 90 tablet, Rfl: 3   hydrocortisone valerate ointment (WEST-CORT) 0.2 %, APPLY TO AFFECTED AREA TWICE A DAY, Disp: 60 g, Rfl: 1   hyoscyamine (LEVSIN SL) 0.125 MG SL tablet, Place 1 tablet (0.125 mg total) under the tongue every 6 (six) hours as  needed., Disp: 45 tablet, Rfl: 1   metroNIDAZOLE (FLAGYL) 500 MG tablet, Take 1 tablet (500 mg total) by mouth 2 (two) times daily for 14 days., Disp: 28 tablet, Rfl: 0   Norethindrone Acet-Ethinyl Est (JUNEL 1/20 PO), Take by mouth., Disp: , Rfl:    sertraline (ZOLOFT) 50 MG tablet, TAKE 1 AND 1/2 TABLETS BY MOUTH DAILY (Patient taking differently: Take 75 mg by mouth daily.), Disp: 135 tablet, Rfl: 1   Vitamin D, Ergocalciferol, (DRISDOL) 1.25 MG  (50000 UNIT) CAPS capsule, Take 1 capsule (50,000 Units total) by mouth every 7 (seven) days., Disp: 12 capsule, Rfl: 0   Vital signs in last 24 hrs: Vitals:   10/18/20 1533  BP: 130/76  Pulse: 94   Wt Readings from Last 3 Encounters:  10/18/20 226 lb 4 oz (102.6 kg)  08/31/20 225 lb (102.1 kg)  02/20/20 217 lb (98.4 kg)    Physical Exam  Well-appearing HEENT: sclera anicteric, oral mucosa moist without lesions Neck: supple, no thyromegaly, JVD or lymphadenopathy Cardiac: RRR without murmurs, S1S2 heard, no peripheral edema Pulm: clear to auscultation bilaterally, normal RR and effort noted Abdomen: soft, no tenderness, with active bowel sounds. No guarding or palpable hepatosplenomegaly. Skin; warm and dry, no jaundice or rash  Labs:  CBC Latest Ref Rng & Units 08/31/2020 02/23/2020 02/22/2020  WBC 3.8 - 10.8 Thousand/uL 6.5 12.8(H) 4.8  Hemoglobin 11.7 - 15.5 g/dL 0.5(L) 10.5(L) 10.3(L)  Hematocrit 35.0 - 45.0 % 34.6(L) 34.0(L) 33.2(L)  Platelets 140 - 400 Thousand/uL 442(H) 259 262   CMP Latest Ref Rng & Units 08/31/2020 02/23/2020 02/22/2020  Glucose 65 - 99 mg/dL 976(B) 341(P) 379(K)  BUN 7 - 25 mg/dL 9 6 9   Creatinine 0.50 - 0.99 mg/dL 2.40 9.73  Sodium 135 - 146 mmol/L 136 136 141  Potassium 3.5 - 5.3 mmol/L 4.4 3.9 3.6  Chloride 98 - 110 mmol/L 102 106 108  CO2 20 - 32 mmol/L 28 22 25   Calcium 8.6 - 10.2 mg/dL 9.1 5.32) 8.3(L)  Total Protein 6.1 - 8.1 g/dL 7.3 6.6 6.1(L)  Total Bilirubin 0.2 - 1.2 mg/dL 0.2 0.6 0.4  Alkaline Phos 38 - 126 U/L - 123 132(H)  AST 10 - 30 U/L 24 146(H) 217(H)  ALT 6 - 29 U/L 19 316(H) 390(H)    On 08/31/2020, iron 12, TIBC 561, 2% saturation, ferritin 4, B12 level 371, no folic acid level on file ___________________________________________ Radiologic studies:   ____________________________________________ Other:   _____________________________________________ Assessment & Plan  Assessment: Encounter Diagnoses  Name Primary?    Chronic diarrhea Yes   Abdominal bloating    Flatulence    She had symptoms like this in the past and responded to empiric therapy for SIBO, so that may be her diagnosis again.  Things seem to worsen after cholecystectomy, so could be bile acid diarrhea.  Might also be some underlying IBS.   Plan: Metronidazole 500 mg twice daily for 14 days As needed use of Levsin.  She will contact me by portal message in several weeks and, if not improved, trial of bile acid binding agent.  If that does not help, then colonoscopy would be warranted.  9.9(M III

## 2020-10-18 NOTE — Patient Instructions (Signed)
If you are age 41 or older, your body mass index should be between 23-30. Your Body mass index is 32.93 kg/m. If this is out of the aforementioned range listed, please consider follow up with your Primary Care Provider.  If you are age 3 or younger, your body mass index should be between 19-25. Your Body mass index is 32.93 kg/m. If this is out of the aformentioned range listed, please consider follow up with your Primary Care Provider.   __________________________________________________________  The [facility] providers would like to encourage you to use [facility] to communicate with providers for non-urgent requests or questions.  Due to long hold times on the telephone, sending your provider a message by [facility] may be a faster and more efficient way to get a response.  Please allow 48 business hours for a response.  Please remember that this is for non-urgent requests.   It was a pleasure to see you today!  Thank you for trusting me with your gastrointestinal care!

## 2020-12-27 ENCOUNTER — Encounter: Payer: Self-pay | Admitting: Family Medicine

## 2020-12-27 DIAGNOSIS — D508 Other iron deficiency anemias: Secondary | ICD-10-CM

## 2021-01-15 ENCOUNTER — Other Ambulatory Visit (INDEPENDENT_AMBULATORY_CARE_PROVIDER_SITE_OTHER): Payer: Managed Care, Other (non HMO)

## 2021-01-15 DIAGNOSIS — D508 Other iron deficiency anemias: Secondary | ICD-10-CM | POA: Diagnosis not present

## 2021-01-15 LAB — CBC WITH DIFFERENTIAL/PLATELET
Basophils Absolute: 0 10*3/uL (ref 0.0–0.1)
Basophils Relative: 0.6 % (ref 0.0–3.0)
Eosinophils Absolute: 0.2 10*3/uL (ref 0.0–0.7)
Eosinophils Relative: 3 % (ref 0.0–5.0)
HCT: 42.2 % (ref 36.0–46.0)
Hemoglobin: 13.6 g/dL (ref 12.0–15.0)
Lymphocytes Relative: 29.6 % (ref 12.0–46.0)
Lymphs Abs: 1.6 10*3/uL (ref 0.7–4.0)
MCHC: 32.3 g/dL (ref 30.0–36.0)
MCV: 85.9 fl (ref 78.0–100.0)
Monocytes Absolute: 0.6 10*3/uL (ref 0.1–1.0)
Monocytes Relative: 11.6 % (ref 3.0–12.0)
Neutro Abs: 3.1 10*3/uL (ref 1.4–7.7)
Neutrophils Relative %: 55.2 % (ref 43.0–77.0)
Platelets: 307 10*3/uL (ref 150.0–400.0)
RBC: 4.92 Mil/uL (ref 3.87–5.11)
RDW: 14.1 % (ref 11.5–15.5)
WBC: 5.5 10*3/uL (ref 4.0–10.5)

## 2021-02-08 ENCOUNTER — Telehealth: Payer: Self-pay

## 2021-02-08 NOTE — Telephone Encounter (Signed)
--  Caller states she tested positive for covid and feels like her chest is heavy and feels like it is hard to breathe.  02/08/2021 11:26:04 AM See HCP within 4 Hours (or PCP triage) Mariel Sleet, RN, Erasmo Downer  02/08/21 1633: Pt states she went to UC in [place]. Was prescribed Albuterol, tessalon pearls & given Paxlovid. Pt advised that she should start to feel better soon, but if her symptoms worsen she needs to go to ED. Pt instructed to call office at day 6 if not noticing any improvement at all. Pt verb understanding.

## 2021-04-22 ENCOUNTER — Telehealth: Payer: Self-pay | Admitting: Family Medicine

## 2021-04-22 NOTE — Telephone Encounter (Signed)
Patient calling in with respiratory symptoms: ?Shortness of breath, chest pain, palpitations or other red words send to Triage ? ?Does the patient have a fever over 100, cough, congestion, sore throat, runny nose, lost of taste/smell (please list symptoms that patient has)?sneezing, runny nose,post nasal drainage, ha ? ?What date did symptoms start?04-18-2021 ?(If over 5 days ago, pt may be scheduled for in person visit) ? ?Have you tested for Covid in the last 5 days? No  ? ?If yes, was it positive []  OR negative [] ? If positive in the last 5 days, please schedule virtual visit now. If negative, schedule for an in person OV with the next available provider if PCP has no openings. Please also let patient know they will be tested again (follow the script below) ? ?"you will have to arrive prior to your appt time to be Covid tested. Please park in back of office at the cone & call [phone] to let the staff know you have arrived. A staff member will meet you at your car to do a rapid covid test. Once the test has resulted you will be notified by phone of your results to determine if appt will remain an in person visit or be converted to a virtual/phone visit. If you arrive less than before your appt time, your visit will be automatically converted to virtual & any recommended testing will happen AFTER the visit." ? ?Pt has an appt with dr fry on 4-4-2023THINGS TO REMEMBER ? ?If no availability for virtual visit in office,  please schedule another [place] office ? ?If no availability at another [place] office, please instruct patient that they can schedule an evisit or virtual visit through their mychart account. Visits up to 8pm ? ?patients can be seen in office 5 days after positive COVID test ? ?  ?

## 2021-04-23 ENCOUNTER — Ambulatory Visit: Payer: Managed Care, Other (non HMO) | Admitting: Family Medicine

## 2021-05-13 ENCOUNTER — Encounter: Payer: Self-pay | Admitting: Family Medicine

## 2021-05-13 ENCOUNTER — Ambulatory Visit: Payer: Managed Care, Other (non HMO) | Admitting: Family Medicine

## 2021-05-13 VITALS — BP 136/87 | HR 67 | Temp 98.2°F | Wt 228.0 lb

## 2021-05-13 DIAGNOSIS — Z862 Personal history of diseases of the blood and blood-forming organs and certain disorders involving the immune mechanism: Secondary | ICD-10-CM | POA: Diagnosis not present

## 2021-05-13 DIAGNOSIS — L709 Acne, unspecified: Secondary | ICD-10-CM

## 2021-05-13 DIAGNOSIS — S161XXS Strain of muscle, fascia and tendon at neck level, sequela: Secondary | ICD-10-CM

## 2021-05-13 DIAGNOSIS — M62838 Other muscle spasm: Secondary | ICD-10-CM | POA: Diagnosis not present

## 2021-05-13 DIAGNOSIS — R7303 Prediabetes: Secondary | ICD-10-CM | POA: Diagnosis not present

## 2021-05-13 DIAGNOSIS — R5383 Other fatigue: Secondary | ICD-10-CM

## 2021-05-13 DIAGNOSIS — R29898 Other symptoms and signs involving the musculoskeletal system: Secondary | ICD-10-CM

## 2021-05-13 DIAGNOSIS — Z9884 Bariatric surgery status: Secondary | ICD-10-CM | POA: Diagnosis not present
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
## 2021-05-13 NOTE — Patient Instructions (Signed)
Congratulations soon to be  ?Dr. Nikki Dom!!!! ? ?[facility] ?Acupuncture and [place] medicine ?Reinaldo Raddle ?[employer] ?[place],  [address] ?[email or web address] ?[phone] ? ?I placed referrals for physical therapy for dry needling and for Dermatology, Dr. Lanier Prude at [facility] dermatology in [place], [place].  You should expect a call about scheduling these appointments. ?

## 2021-05-14 ENCOUNTER — Ambulatory Visit: Payer: Managed Care, Other (non HMO) | Attending: Family Medicine

## 2021-05-14 DIAGNOSIS — R29898 Other symptoms and signs involving the musculoskeletal system: Secondary | ICD-10-CM | POA: Diagnosis not present

## 2021-05-14 DIAGNOSIS — M546 Pain in thoracic spine: Secondary | ICD-10-CM

## 2021-05-14 DIAGNOSIS — S161XXS Strain of muscle, fascia and tendon at neck level, sequela: Secondary | ICD-10-CM | POA: Insufficient documentation

## 2021-05-14 DIAGNOSIS — M62838 Other muscle spasm: Secondary | ICD-10-CM | POA: Diagnosis present

## 2021-05-14 DIAGNOSIS — X58XXXS Exposure to other specified factors, sequela: Secondary | ICD-10-CM | POA: Diagnosis not present

## 2021-05-14 DIAGNOSIS — R252 Cramp and spasm: Secondary | ICD-10-CM

## 2021-05-14 DIAGNOSIS — M6281 Muscle weakness (generalized): Secondary | ICD-10-CM

## 2021-05-14 DIAGNOSIS — R293 Abnormal posture: Secondary | ICD-10-CM

## 2021-05-14 LAB — VITAMIN B12: Vitamin B-12: 237 pg/mL (ref 211–911)

## 2021-05-14 LAB — CBC WITH DIFFERENTIAL/PLATELET
Basophils Absolute: 0.1 10*3/uL (ref 0.0–0.1)
Basophils Relative: 1.4 % (ref 0.0–3.0)
Eosinophils Absolute: 0.2 10*3/uL (ref 0.0–0.7)
Eosinophils Relative: 2.7 % (ref 0.0–5.0)
HCT: 40.8 % (ref 36.0–46.0)
Hemoglobin: 13 g/dL (ref 12.0–15.0)
Lymphocytes Relative: 38 % (ref 12.0–46.0)
Lymphs Abs: 2.2 10*3/uL (ref 0.7–4.0)
MCHC: 31.8 g/dL (ref 30.0–36.0)
MCV: 86.2 fl (ref 78.0–100.0)
Monocytes Absolute: 0.6 10*3/uL (ref 0.1–1.0)
Monocytes Relative: 10.5 % (ref 3.0–12.0)
Neutro Abs: 2.8 10*3/uL (ref 1.4–7.7)
Neutrophils Relative %: 47.4 % (ref 43.0–77.0)
Platelets: 328 10*3/uL (ref 150.0–400.0)
RBC: 4.73 Mil/uL (ref 3.87–5.11)
RDW: 13 % (ref 11.5–15.5)
WBC: 5.9 10*3/uL (ref 4.0–10.5)

## 2021-05-14 LAB — IRON,TIBC AND FERRITIN PANEL
%SAT: 11 % (calc) — ABNORMAL LOW (ref 16–45)
Ferritin: 6 ng/mL — ABNORMAL LOW (ref 16–232)
Iron: 55 ug/dL (ref 40–190)
TIBC: 485 mcg/dL (calc) — ABNORMAL HIGH (ref 250–450)

## 2021-05-14 LAB — TSH: TSH: 1.93 u[IU]/mL (ref 0.35–5.50)

## 2021-05-14 LAB — VITAMIN D 25 HYDROXY (VIT D DEFICIENCY, FRACTURES): VITD: 21.05 ng/mL — ABNORMAL LOW (ref 30.00–100.00)

## 2021-05-14 LAB — HEMOGLOBIN A1C: Hgb A1c MFr Bld: 7.1 % — ABNORMAL HIGH (ref 4.6–6.5)

## 2021-05-14 LAB — T4, FREE: Free T4: 0.69 ng/dL (ref 0.60–1.60)
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
## 2021-05-15 ENCOUNTER — Other Ambulatory Visit: Payer: Self-pay | Admitting: Family Medicine

## 2021-05-15 DIAGNOSIS — E559 Vitamin D deficiency, unspecified: Secondary | ICD-10-CM

## 2021-05-15 MED ORDER — VITAMIN D (ERGOCALCIFEROL) 1.25 MG (50000 UNIT) PO CAPS: 50000.0000 [IU] | ORAL_CAPSULE | ORAL | 0 refills | Status: DC

## 2021-05-16 ENCOUNTER — Ambulatory Visit: Payer: Managed Care, Other (non HMO)

## 2021-05-16 ENCOUNTER — Encounter: Payer: Self-pay | Admitting: Family Medicine

## 2021-05-16 DIAGNOSIS — R293 Abnormal posture: Secondary | ICD-10-CM

## 2021-05-16 DIAGNOSIS — R252 Cramp and spasm: Secondary | ICD-10-CM

## 2021-05-16 DIAGNOSIS — M62838 Other muscle spasm: Secondary | ICD-10-CM | POA: Diagnosis not present

## 2021-05-16 DIAGNOSIS — M6281 Muscle weakness (generalized): Secondary | ICD-10-CM

## 2021-05-16 DIAGNOSIS — M546 Pain in thoracic spine: Secondary | ICD-10-CM

## 2021-05-16 NOTE — Patient Instructions (Signed)
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
## 2021-05-22 ENCOUNTER — Other Ambulatory Visit: Payer: Self-pay | Admitting: Family Medicine

## 2021-05-22 DIAGNOSIS — D508 Other iron deficiency anemias: Secondary | ICD-10-CM

## 2021-05-22 DIAGNOSIS — R790 Abnormal level of blood mineral: Secondary | ICD-10-CM

## 2021-05-22 DIAGNOSIS — Z9884 Bariatric surgery status: Secondary | ICD-10-CM

## 2021-05-27 ENCOUNTER — Telehealth: Payer: Self-pay | Admitting: Oncology

## 2021-05-27 NOTE — Telephone Encounter (Signed)
Called to schedule new heme , left voicemail for patient to call us back to schedule  ?

## 2021-05-28 ENCOUNTER — Ambulatory Visit: Payer: Managed Care, Other (non HMO)

## 2021-05-30 ENCOUNTER — Ambulatory Visit: Payer: Managed Care, Other (non HMO) | Attending: Family Medicine

## 2021-05-30 DIAGNOSIS — R252 Cramp and spasm: Secondary | ICD-10-CM | POA: Diagnosis present

## 2021-05-30 DIAGNOSIS — R293 Abnormal posture: Secondary | ICD-10-CM | POA: Diagnosis present

## 2021-05-30 DIAGNOSIS — M6281 Muscle weakness (generalized): Secondary | ICD-10-CM | POA: Insufficient documentation

## 2021-05-30 DIAGNOSIS — M546 Pain in thoracic spine: Secondary | ICD-10-CM | POA: Diagnosis present
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
## 2021-06-06 ENCOUNTER — Ambulatory Visit: Payer: Managed Care, Other (non HMO)

## 2021-06-06 DIAGNOSIS — M6281 Muscle weakness (generalized): Secondary | ICD-10-CM

## 2021-06-06 DIAGNOSIS — M546 Pain in thoracic spine: Secondary | ICD-10-CM

## 2021-06-06 DIAGNOSIS — R293 Abnormal posture: Secondary | ICD-10-CM | POA: Diagnosis not present

## 2021-06-06 DIAGNOSIS — R252 Cramp and spasm: Secondary | ICD-10-CM
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
## 2021-06-13 ENCOUNTER — Other Ambulatory Visit: Payer: Self-pay | Admitting: Oncology

## 2021-06-13 ENCOUNTER — Ambulatory Visit: Payer: Managed Care, Other (non HMO)

## 2021-06-13 DIAGNOSIS — D508 Other iron deficiency anemias: Secondary | ICD-10-CM

## 2021-06-13 NOTE — Progress Notes (Signed)
[facility]  [address] [place],  [place]  [address] [phone]  Clinic Day:  06/14/2021  Referring physician: Deeann Saint, MD   HISTORY OF PRESENT ILLNESS:  The patient is a 41 y.o. female  who I was asked to consult upon for iron deficiency anemia.  Recent labs showed a normal hemoglobin of 13, with a normal MCV of 86.2.  However, her iron studies done recently came back abnormal, which included a low ferritin of 6, a serum iron of 55, a TIBC of 485, and a low iron saturation of 11%.  Of note, the patient underwent gastric bypass surgery in 2015.  She denies having any overt forms of blood loss over these past few months.  To her knowledge, there is no family history of iron deficiency anemia or other hematologic disorders.  The patient claims she has been given at least 3 IV iron infusions over these past few years, but none recently.  PAST MEDICAL HISTORY:   Past Medical History:  Diagnosis Date   Anxiety    Depression    Diabetes mellitus 05/2008   DR. BALAN   GERD (gastroesophageal reflux disease)    after taking antibiotics it went away   Hypertension    Obesity     PAST SURGICAL HISTORY:   Past Surgical History:  Procedure Laterality Date   BREATH TEK H PYLORI N/A 08/20/2012   Procedure: BREATH TEK Richardo Priest;  Surgeon: Lodema Pilot, DO;  Location: [facility];  Service: Endoscopy;  Laterality: N/A;   CHOLECYSTECTOMY N/A 02/22/2020   Procedure: LAPAROSCOPIC CHOLECYSTECTOMY WITH INTRAOPERATIVE CHOLANGIOGRAM;  Surgeon: Luretha Murphy, MD;  Location: [facility];  Service: General;  Laterality: N/A;   GASTRIC ROUX-EN-Y N/A 08/24/2012   Procedure: LAPAROSCOPIC ROUX-EN-Y GASTRIC BYPASS WITH UPPER ENDOSCOPY;  Surgeon: Lodema Pilot, DO;  Location: [facility];  Service: General;  Laterality: N/A;    CURRENT MEDICATIONS:   Current Outpatient Medications  Medication Sig Dispense Refill   Multiple Vitamin (MULTIVITAMIN) tablet Take 1 tablet by  mouth daily.     cetirizine (ZYRTEC) 10 MG tablet TAKE 1 TABLET BY MOUTH EVERY DAY (Patient taking differently: Take 10 mg by mouth daily.) 90 tablet 0   sertraline (ZOLOFT) 50 MG tablet TAKE 1 AND 1/2 TABLETS BY MOUTH DAILY (Patient taking differently: Take 75 mg by mouth daily.) 135 tablet 1   No current facility-administered medications for this visit.   ALLERGIES:   Allergies  Allergen Reactions   Metformin And Related Itching    FAMILY HISTORY:   Family History  Problem Relation Age of Onset   Diabetes Mother    Hypertension Mother    Miscarriages / Annelisa Mother    Mental illness Brother    Diabetes Maternal Aunt    Dementia Maternal Grandmother    Diabetes Maternal Grandmother    Diabetes Maternal Grandfather     SOCIAL HISTORY:   reports that she has never smoked. She has never used smokeless tobacco. She reports that she does not drink alcohol and does not use drugs.  REVIEW OF SYSTEMS:  Review of Systems  Constitutional:  Positive for fatigue. Negative for fever.  HENT:   Negative for hearing loss and sore throat.   Eyes:  Negative for eye problems.  Respiratory:  Negative for chest tightness, cough and hemoptysis.   Cardiovascular:  Negative for chest pain and palpitations.  Gastrointestinal:  Negative for abdominal distention, abdominal pain, blood in stool, constipation, diarrhea, nausea and vomiting.  Endocrine:  Negative for hot flashes.  Genitourinary:  Negative for difficulty urinating, dysuria, frequency, hematuria and nocturia.   Musculoskeletal:  Negative for arthralgias, back pain, gait problem and myalgias.  Skin: Negative.  Negative for itching and rash.  Neurological:  Positive for headaches. Negative for dizziness, extremity weakness, gait problem, light-headedness and numbness.  Hematological: Negative.   Psychiatric/Behavioral:  Positive for depression. Negative for suicidal ideas. The patient is not nervous/anxious.     PHYSICAL EXAM:   Blood pressure 133/69, pulse 99, temperature 98.5 F (36.9 C), resp. rate 16, height 5' 9.5" (1.765 m), weight 228 lb (103.4 kg), SpO2 95 %. Wt Readings from Last 3 Encounters:  06/14/21 228 lb (103.4 kg)  05/13/21 228 lb (103.4 kg)  10/18/20 226 lb 4 oz (102.6 kg)   Body mass index is 33.19 kg/m. Performance status (ECOG): 0 - Asymptomatic Physical Exam Constitutional:      Appearance: Normal appearance. She is not ill-appearing.  HENT:     Mouth/Throat:     Mouth: Mucous membranes are moist.     Pharynx: Oropharynx is clear. No oropharyngeal exudate or posterior oropharyngeal erythema.  Cardiovascular:     Rate and Rhythm: Normal rate and regular rhythm.     Heart sounds: No murmur heard.   No friction rub. No gallop.  Pulmonary:     Effort: Pulmonary effort is normal. No respiratory distress.     Breath sounds: Normal breath sounds. No wheezing, rhonchi or rales.  Abdominal:     General: Bowel sounds are normal. There is no distension.     Palpations: Abdomen is soft. There is no mass.     Tenderness: There is no abdominal tenderness.  Musculoskeletal:        General: No swelling.     Right lower leg: No edema.     Left lower leg: No edema.  Lymphadenopathy:     Cervical: No cervical adenopathy.     Upper Body:     Right upper body: No supraclavicular or axillary adenopathy.     Left upper body: No supraclavicular or axillary adenopathy.     Lower Body: No right inguinal adenopathy. No left inguinal adenopathy.  Skin:    General: Skin is warm.     Coloration: Skin is not jaundiced.     Findings: No lesion or rash.  Neurological:     General: No focal deficit present.     Mental Status: She is alert and oriented to person, place, and time. Mental status is at baseline.  Psychiatric:        Mood and Affect: Mood normal.        Behavior: Behavior normal.        Thought Content: Thought content normal.    LABS:      Latest Ref Rng & Units 06/14/2021   12:00 AM  05/13/2021    3:13 PM 01/15/2021   10:07 AM  CBC  WBC  5.6      5.9   5.5    Hemoglobin 12.0 - 16.0 12.1      13.0   13.6    Hematocrit 36 - 46 39      40.8   42.2    Platelets 150 - 400 K/uL 246      328.0   307.0       This result is from an external source.    Latest Reference Range & Units 05/13/21 15:13  Iron 40 - 190 mcg/dL 55  TIBC 921 - 194  mcg/dL (calc) 161485 (H)  %SAT 16 - 45 % (calc) 11 (L)  Ferritin 16 - 232 ng/mL 6 (L)  (H): Data is abnormally high (L): Data is abnormally low   Latest Reference Range & Units Most Recent  Vitamin B12 211 - 911 pg/mL 237 05/13/21 15:13   ASSESSMENT & PLAN:  A 41 y.o. female who I was asked to consult upon for iron deficiency anemia.  I will arrange for her to receive IV iron over these next few weeks to rapidly replenish her iron stores and improve her hemoglobin.  The patient understands her gastric bypass is the reason behind her iron deficiency anemia as her stomach is the major source of iron absorption in the body.  Intermittently, she may require IV iron infusions indefinitely to get her iron stores back to more optimal levels.  As B12 deficiency can also develop after gastric bypass surgery, her B12 levels will also be followed on a routine basis.  I will see her back in 3 months to reassess her iron and hemoglobin levels to see how well she responded to her upcoming IV iron.  The patient understands all the plans discussed today and is in agreement with them.  I do appreciate Deeann SaintBanks, Shannon R, MD for his new consult.   Izza Bickle Kirby FunkA Khalid Lacko, MD

## 2021-06-14 ENCOUNTER — Inpatient Hospital Stay: Payer: Managed Care, Other (non HMO) | Attending: Oncology | Admitting: Oncology

## 2021-06-14 ENCOUNTER — Inpatient Hospital Stay: Payer: Managed Care, Other (non HMO)

## 2021-06-14 ENCOUNTER — Encounter: Payer: Self-pay | Admitting: Oncology

## 2021-06-14 VITALS — BP 133/69 | HR 99 | Temp 98.5°F | Resp 16 | Ht 69.5 in | Wt 228.0 lb

## 2021-06-14 DIAGNOSIS — Z9884 Bariatric surgery status: Secondary | ICD-10-CM | POA: Diagnosis not present

## 2021-06-14 DIAGNOSIS — D508 Other iron deficiency anemias: Secondary | ICD-10-CM | POA: Diagnosis not present

## 2021-06-14 LAB — CBC AND DIFFERENTIAL
HCT: 39 (ref 36–46)
Hemoglobin: 12.1 (ref 12.0–16.0)
Neutrophils Absolute: 3.25
Platelets: 246 10*3/uL (ref 150–400)
WBC: 5.6

## 2021-06-14 LAB — CBC: RBC: 4.59 (ref 3.87–5.11)

## 2021-06-18 ENCOUNTER — Encounter: Payer: Self-pay | Admitting: Oncology

## 2021-06-20 ENCOUNTER — Telehealth: Payer: Self-pay | Admitting: Family Medicine

## 2021-06-20 ENCOUNTER — Ambulatory Visit: Payer: Managed Care, Other (non HMO) | Attending: Family Medicine

## 2021-06-20 DIAGNOSIS — R293 Abnormal posture: Secondary | ICD-10-CM | POA: Insufficient documentation

## 2021-06-20 DIAGNOSIS — M546 Pain in thoracic spine: Secondary | ICD-10-CM | POA: Insufficient documentation

## 2021-06-20 DIAGNOSIS — M6281 Muscle weakness (generalized): Secondary | ICD-10-CM | POA: Insufficient documentation

## 2021-06-20 DIAGNOSIS — R252 Cramp and spasm: Secondary | ICD-10-CM | POA: Insufficient documentation

## 2021-06-20 DIAGNOSIS — E118 Type 2 diabetes mellitus with unspecified complications: Secondary | ICD-10-CM
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
## 2021-06-20 NOTE — Telephone Encounter (Signed)
Pt requesting a referral to an endocrinologist, concerned about her a1c. Pls call with guidance

## 2021-06-21 NOTE — Telephone Encounter (Signed)
Referral placed.

## 2021-06-21 NOTE — Telephone Encounter (Signed)
Ok for referral?

## 2021-06-23 ENCOUNTER — Encounter: Payer: Self-pay | Admitting: Hematology

## 2021-06-24 ENCOUNTER — Telehealth: Payer: Self-pay | Admitting: Family Medicine

## 2021-06-24 NOTE — Telephone Encounter (Signed)
Patient is requesting a referral to endocrinology due to her elevated A1C at her visit.  She has been trying to get someone to call her back from Dr. Salomon Fick office since the beginning of May with no success.  She is requesting a call back.

## 2021-06-24 NOTE — Telephone Encounter (Signed)
Spoke with patient, is aware of referral placed for endocrinology.

## 2021-06-26 ENCOUNTER — Other Ambulatory Visit: Payer: Self-pay

## 2021-06-26 ENCOUNTER — Ambulatory Visit: Payer: Managed Care, Other (non HMO) | Admitting: Rehabilitative and Restorative Service Providers"

## 2021-06-26 ENCOUNTER — Encounter: Payer: Self-pay | Admitting: Rehabilitative and Restorative Service Providers"

## 2021-06-26 ENCOUNTER — Ambulatory Visit: Payer: Managed Care, Other (non HMO)

## 2021-06-26 DIAGNOSIS — R293 Abnormal posture: Secondary | ICD-10-CM | POA: Diagnosis not present

## 2021-06-26 DIAGNOSIS — M546 Pain in thoracic spine: Secondary | ICD-10-CM

## 2021-06-26 DIAGNOSIS — R252 Cramp and spasm: Secondary | ICD-10-CM

## 2021-06-26 DIAGNOSIS — D508 Other iron deficiency anemias: Secondary | ICD-10-CM

## 2021-06-26 DIAGNOSIS — M6281 Muscle weakness (generalized): Secondary | ICD-10-CM
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
## 2021-06-27 ENCOUNTER — Encounter: Payer: Self-pay | Admitting: Oncology

## 2021-06-27 NOTE — Progress Notes (Signed)
..  Feraheme orders changed to venofer due to insurance plan preference.  Message to scheduling has been sent.  

## 2021-06-28 ENCOUNTER — Telehealth: Payer: Self-pay | Admitting: Oncology

## 2021-06-28 ENCOUNTER — Encounter: Payer: Self-pay | Admitting: Oncology

## 2021-06-28 NOTE — Telephone Encounter (Signed)
06/28/21 spoke with patient and scheduled IV IRON/B12

## 2021-07-05 MED FILL — Iron Sucrose Inj 20 MG/ML (Fe Equiv): INTRAVENOUS | Qty: 10 | Status: AC

## 2021-07-08 ENCOUNTER — Inpatient Hospital Stay: Payer: Managed Care, Other (non HMO) | Attending: Oncology

## 2021-07-08 VITALS — BP 110/84 | HR 88 | Temp 98.8°F | Resp 18 | Ht 69.5 in | Wt 228.2 lb

## 2021-07-08 DIAGNOSIS — D509 Iron deficiency anemia, unspecified: Secondary | ICD-10-CM | POA: Diagnosis present

## 2021-07-08 DIAGNOSIS — Z9884 Bariatric surgery status: Secondary | ICD-10-CM

## 2021-07-08 MED ORDER — SODIUM CHLORIDE 0.9 % IV SOLN
200.0000 mg | Freq: Once | INTRAVENOUS | Status: AC
  Administered 2021-07-08: 200 mg via INTRAVENOUS
  Filled 2021-07-08: qty 200

## 2021-07-08 MED ORDER — CYANOCOBALAMIN 1000 MCG/ML IJ SOLN
1000.0000 ug | Freq: Once | INTRAMUSCULAR | Status: AC
  Administered 2021-07-08: 1000 ug via INTRAMUSCULAR
  Filled 2021-07-08: qty 1

## 2021-07-08 MED ORDER — SODIUM CHLORIDE 0.9 % IV SOLN: Freq: Once | INTRAVENOUS | Status: AC

## 2021-07-08 NOTE — Patient Instructions (Addendum)
Iron Sucrose Injection What is this medication? IRON SUCROSE (EYE ern SOO krose) treats low levels of iron (iron deficiency anemia) in people with kidney disease. Iron is a mineral that plays an important role in making red blood cells, which carry oxygen from your lungs to the rest of your body. This medicine may be used for other purposes; ask your health care provider or pharmacist if you have questions. COMMON BRAND NAME(S): Venofer What should I tell my care team before I take this medication? They need to know if you have any of these conditions: Anemia not caused by low iron levels Heart disease High levels of iron in the blood Kidney disease Liver disease An unusual or allergic reaction to iron, other medications, foods, dyes, or preservatives Pregnant or trying to get pregnant Breast-feeding How should I use this medication? This medication is for infusion into a vein. It is given in a hospital or clinic setting. Talk to your care team about the use of this medication in children. While this medication may be prescribed for children as young as 2 years for selected conditions, precautions do apply. Overdosage: If you think you have taken too much of this medicine contact a poison control center or emergency room at once. NOTE: This medicine is only for you. Do not share this medicine with others. What if I miss a dose? It is important not to miss your dose. Call your care team if you are unable to keep an appointment. What may interact with this medication? Do not take this medication with any of the following: Deferoxamine Dimercaprol Other iron products This medication may also interact with the following: Chloramphenicol Deferasirox This list may not describe all possible interactions. Give your health care provider a list of all the medicines, herbs, non-prescription drugs, or dietary supplements you use. Also tell them if you smoke, drink alcohol, or use illegal drugs.  Some items may interact with your medicine. What should I watch for while using this medication? Visit your care team regularly. Tell your care team if your symptoms do not start to get better or if they get worse. You may need blood work done while you are taking this medication. You may need to follow a special diet. Talk to your care team. Foods that contain iron include: whole grains/cereals, dried fruits, beans, or peas, leafy green vegetables, and organ meats (liver, kidney). What side effects may I notice from receiving this medication? Side effects that you should report to your care team as soon as possible: Allergic reactions--skin rash, itching, hives, swelling of the face, lips, tongue, or throat Low blood pressure--dizziness, feeling faint or lightheaded, blurry vision Shortness of breath Side effects that usually do not require medical attention (report to your care team if they continue or are bothersome): Flushing Headache Joint pain Muscle pain Nausea Pain, redness, or irritation at injection site This list may not describe all possible side effects. Call your doctor for medical advice about side effects. You may report side effects to FDA at 1-800-FDA-1088. Where should I keep my medication? This medication is given in a hospital or clinic and will not be stored at home. NOTE: This sheet is a summary. It may not cover all possible information. If you have questions about this medicine, talk to your doctor, pharmacist, or health care provider.  2023 Elsevier/Gold Standard (2020-06-01 00:00:00)   Vitamin B12 Injection What is this medication? Vitamin B12 (VAHY tuh min B12) prevents and treats low vitamin B12 levels in   your body. It is used in people who do not get enough vitamin B12 from their diet or when their digestive tract does not absorb enough. Vitamin B12 plays an important role in maintaining the health of your nervous system and red blood cells. This medicine may  be used for other purposes; ask your health care provider or pharmacist if you have questions. COMMON BRAND NAME(S): B-12 Compliance Kit, B-12 Injection Kit, Cyomin, Dodex, LA-12, Nutri-Twelve, Physicians EZ Use B-12, Primabalt What should I tell my care team before I take this medication? They need to know if you have any of these conditions: Kidney disease Leber's disease Megaloblastic anemia An unusual or allergic reaction to cyanocobalamin, cobalt, other medications, foods, dyes, or preservatives Pregnant or trying to get pregnant Breast-feeding How should I use this medication? This medication is injected into a muscle or deeply under the skin. It is usually given in a clinic or care team's office. However, your care team may teach you how to inject yourself. Follow all instructions. Talk to your care team about the use of this medication in children. Special care may be needed. Overdosage: If you think you have taken too much of this medicine contact a poison control center or emergency room at once. NOTE: This medicine is only for you. Do not share this medicine with others. What if I miss a dose? If you are given your dose at a clinic or care team's office, call to reschedule your appointment. If you give your own injections, and you miss a dose, take it as soon as you can. If it is almost time for your next dose, take only that dose. Do not take double or extra doses. What may interact with this medication? Alcohol Colchicine This list may not describe all possible interactions. Give your health care provider a list of all the medicines, herbs, non-prescription drugs, or dietary supplements you use. Also tell them if you smoke, drink alcohol, or use illegal drugs. Some items may interact with your medicine. What should I watch for while using this medication? Visit your care team regularly. You may need blood work done while you are taking this medication. You may need to follow a  special diet. Talk to your care team. Limit your alcohol intake and avoid smoking to get the best benefit. What side effects may I notice from receiving this medication? Side effects that you should report to your care team as soon as possible: Allergic reactions--skin rash, itching, hives, swelling of the face, lips, tongue, or throat Swelling of the ankles, hands, or feet Trouble breathing Side effects that usually do not require medical attention (report to your care team if they continue or are bothersome): Diarrhea This list may not describe all possible side effects. Call your doctor for medical advice about side effects. You may report side effects to FDA at 1-800-FDA-1088. Where should I keep my medication? Keep out of the reach of children. Store at room temperature between 15 and 30 degrees C (59 and 85 degrees F). Protect from light. Throw away any unused medication after the expiration date. NOTE: This sheet is a summary. It may not cover all possible information. If you have questions about this medicine, talk to your doctor, pharmacist, or health care provider.  2023 Elsevier/Gold Standard (2020-09-18 00:00:00)  

## 2021-07-09 MED FILL — Iron Sucrose Inj 20 MG/ML (Fe Equiv): INTRAVENOUS | Qty: 10 | Status: AC

## 2021-07-10 ENCOUNTER — Inpatient Hospital Stay: Payer: Managed Care, Other (non HMO)

## 2021-07-10 VITALS — BP 122/78 | HR 88 | Temp 98.2°F | Resp 18 | Wt 228.0 lb

## 2021-07-10 DIAGNOSIS — Z9884 Bariatric surgery status: Secondary | ICD-10-CM

## 2021-07-10 DIAGNOSIS — D509 Iron deficiency anemia, unspecified: Secondary | ICD-10-CM | POA: Diagnosis not present

## 2021-07-10 MED ORDER — SODIUM CHLORIDE 0.9 % IV SOLN: Freq: Once | INTRAVENOUS | Status: AC

## 2021-07-10 MED ORDER — SODIUM CHLORIDE 0.9 % IV SOLN
200.0000 mg | Freq: Once | INTRAVENOUS | Status: AC
  Administered 2021-07-10: 200 mg via INTRAVENOUS
  Filled 2021-07-10: qty 200

## 2021-07-10 NOTE — Progress Notes (Signed)
Patient waited her 30 minute post iron observation with no issues. VSS upon discharge.

## 2021-07-11 ENCOUNTER — Other Ambulatory Visit: Payer: Self-pay | Admitting: Pharmacist

## 2021-07-11 ENCOUNTER — Ambulatory Visit: Payer: Managed Care, Other (non HMO)

## 2021-07-11 ENCOUNTER — Other Ambulatory Visit: Payer: Self-pay

## 2021-07-11 DIAGNOSIS — R252 Cramp and spasm: Secondary | ICD-10-CM

## 2021-07-11 DIAGNOSIS — R293 Abnormal posture: Secondary | ICD-10-CM | POA: Diagnosis not present

## 2021-07-11 DIAGNOSIS — E538 Deficiency of other specified B group vitamins: Secondary | ICD-10-CM

## 2021-07-11 DIAGNOSIS — M6281 Muscle weakness (generalized): Secondary | ICD-10-CM

## 2021-07-11 DIAGNOSIS — M546 Pain in thoracic spine: Secondary | ICD-10-CM
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
## 2021-07-12 ENCOUNTER — Inpatient Hospital Stay: Payer: Managed Care, Other (non HMO)

## 2021-07-12 DIAGNOSIS — D509 Iron deficiency anemia, unspecified: Secondary | ICD-10-CM

## 2021-07-12 DIAGNOSIS — Z9884 Bariatric surgery status: Secondary | ICD-10-CM

## 2021-07-12 MED ORDER — SODIUM CHLORIDE 0.9 % IV SOLN
200.0000 mg | Freq: Once | INTRAVENOUS | Status: AC
  Administered 2021-07-12: 200 mg via INTRAVENOUS
  Filled 2021-07-12: qty 200

## 2021-07-12 MED ORDER — SODIUM CHLORIDE 0.9 % IV SOLN: Freq: Once | INTRAVENOUS | Status: AC

## 2021-07-12 MED FILL — Iron Sucrose Inj 20 MG/ML (Fe Equiv): INTRAVENOUS | Qty: 10 | Status: AC

## 2021-07-15 ENCOUNTER — Inpatient Hospital Stay: Payer: Managed Care, Other (non HMO)

## 2021-07-15 VITALS — BP 110/85 | HR 76 | Temp 99.1°F | Resp 20 | Ht 69.5 in | Wt 228.0 lb

## 2021-07-15 DIAGNOSIS — D509 Iron deficiency anemia, unspecified: Secondary | ICD-10-CM | POA: Diagnosis not present

## 2021-07-15 DIAGNOSIS — Z9884 Bariatric surgery status: Secondary | ICD-10-CM

## 2021-07-15 MED ORDER — SODIUM CHLORIDE 0.9 % IV SOLN: Freq: Once | INTRAVENOUS | Status: AC

## 2021-07-15 MED ORDER — SODIUM CHLORIDE 0.9 % IV SOLN
200.0000 mg | Freq: Once | INTRAVENOUS | Status: AC
  Administered 2021-07-15: 200 mg via INTRAVENOUS
  Filled 2021-07-15: qty 200

## 2021-07-17 MED FILL — Iron Sucrose Inj 20 MG/ML (Fe Equiv): INTRAVENOUS | Qty: 10 | Status: AC

## 2021-07-18 ENCOUNTER — Inpatient Hospital Stay: Payer: Managed Care, Other (non HMO)

## 2021-07-18 ENCOUNTER — Ambulatory Visit: Payer: Managed Care, Other (non HMO)

## 2021-07-18 VITALS — BP 109/83 | HR 75 | Temp 98.1°F | Resp 18 | Ht 69.5 in | Wt 228.0 lb

## 2021-07-18 DIAGNOSIS — D509 Iron deficiency anemia, unspecified: Secondary | ICD-10-CM

## 2021-07-18 DIAGNOSIS — Z9884 Bariatric surgery status: Secondary | ICD-10-CM

## 2021-07-18 MED ORDER — SODIUM CHLORIDE 0.9 % IV SOLN: Freq: Once | INTRAVENOUS | Status: AC

## 2021-07-18 MED ORDER — SODIUM CHLORIDE 0.9 % IV SOLN
200.0000 mg | Freq: Once | INTRAVENOUS | Status: AC
  Administered 2021-07-18: 200 mg via INTRAVENOUS
  Filled 2021-07-18: qty 200

## 2021-07-18 NOTE — Patient Instructions (Signed)
Iron Deficiency Anemia, Adult Iron deficiency anemia is a condition in which the concentration of red blood cells or hemoglobin in the blood is below normal because of too little iron. Hemoglobin is a substance in red blood cells that carries oxygen to the body's tissues. When the concentration of red blood cells or hemoglobin is too low, not enough oxygen reaches these tissues. Iron deficiency anemia is usually long-lasting, and it develops over time. It may or may not cause symptoms. It is a common type of anemia. What are the causes? This condition may be caused by: Not enough iron in the diet. Abnormal absorption in the gut. Increased need for iron because of pregnancy or heavy menstrual periods, for females. Cancers of the gastrointestinal system, such as colon cancer. Blood loss caused by bleeding in the intestine. This may be from a gastrointestinal condition like Crohn's disease. Frequent blood draws, such as from blood donation. What increases the risk? The following factors may make you more likely to develop this condition: Being pregnant. Being a teenage girl going through a growth spurt. What are the signs or symptoms? Symptoms of this condition may include: Pale skin, lips, and nail beds. Weakness, dizziness, and getting tired easily. Headache. Shortness of breath when moving or exercising. Cold hands and feet. Fast or irregular heartbeat. Irritability or rapid breathing. These are more common in severe anemia. Mild anemia may not cause any symptoms. How is this diagnosed? This condition is diagnosed based on: Your medical history. A physical exam. Blood tests. You may have additional tests to find the underlying cause of your anemia, such as: Testing for blood in the stool (fecal occult blood test). A procedure to see inside your colon and rectum (colonoscopy). A procedure to see inside your esophagus and stomach (endoscopy). A test in which cells are removed from  bone marrow (bone marrow aspiration) or fluid is removed from the bone marrow to be examined. This is rarely needed. How is this treated? This condition is treated by correcting the cause of your iron deficiency. Treatment may involve: Adding iron-rich foods to your diet. Taking iron supplements. If you are pregnant or breastfeeding, you may need to take extra iron because your normal diet usually does not provide the amount of iron that you need. Increasing vitamin C intake. Vitamin C helps your body absorb iron. Your health care provider may recommend that you take iron supplements along with a glass of orange juice or a vitamin C supplement. Medicines to make heavy menstrual flow lighter. Surgery. You may need repeat blood tests to determine whether treatment is working. If the treatment does not seem to be working, you may need more tests. Follow these instructions at home: Medicines Take over-the-counter and prescription medicines only as told by your health care provider. This includes iron supplements and vitamins. For the best iron absorption, you should take iron supplements when your stomach is empty. If you cannot tolerate them on an empty stomach, you may need to take them with food. Do not drink milk or take antacids at the same time as your iron supplements. Milk and antacids may interfere with iron absorption. Iron supplements may turn stool (feces) a darker color and it may appear black. If you cannot tolerate taking iron supplements by mouth, talk with your health care provider about taking them through an IV or through an injection into a muscle. Eating and drinking  Talk with your health care provider before changing your diet. He or she may recommend   that you eat foods that contain a lot of iron, such as: Liver. Low-fat (lean) beef. Breads and cereals that have iron added to them (are fortified). Eggs. Dried fruit. Dark green, leafy vegetables. To help your body use the  iron from iron-rich foods, eat those foods at the same time as fresh fruits and vegetables that are high in vitamin C. Foods that are high in vitamin C include: Oranges. Peppers. Tomatoes. Mangoes. Drink enough fluid to keep your urine pale yellow. Managing constipation If you are taking an iron supplement, it may cause constipation. To prevent or treat constipation, you may need to: Take over-the-counter or prescription medicines. Eat foods that are high in fiber, such as beans, whole grains, and fresh fruits and vegetables. Limit foods that are high in fat and processed sugars, such as fried or sweet foods. General instructions Return to your normal activities as told by your health care provider. Ask your health care provider what activities are safe for you. Practice good hygiene. Anemia can make you more prone to illness and infection. Keep all follow-up visits as told by your health care provider. This is important. Contact a health care provider if you: Feel nauseous or you vomit. Feel weak. Have unexplained sweating. Develop symptoms of constipation, such as: Having fewer than three bowel movements a week. Straining to have a bowel movement. Having stools that are hard, dry, or larger than normal. Feeling full or bloated. Pain in the lower abdomen. Not feeling relief after having a bowel movement. Get help right away if you: Faint. If this happens, do not drive yourself to the hospital. Have chest pain. Have shortness of breath that: Is severe. Gets worse with physical activity. Have an irregular or rapid heartbeat. Become light-headed when getting up from a sitting or lying down position. These symptoms may represent a serious problem that is an emergency. Do not wait to see if the symptoms will go away. Get medical help right away. Call your local emergency services (911 in the [place] Do not drive yourself to the hospital. Summary Iron deficiency anemia is a condition in  which the concentration of red blood cells or hemoglobin in the blood is below normal because of too little iron. This condition is treated by correcting the cause of your iron deficiency. Take over-the-counter and prescription medicines only as told by your health care provider. This includes iron supplements and vitamins. To help your body use the iron from iron-rich foods, eat those foods at the same time as fresh fruits and vegetables that are high in vitamin C. Get help right away if you have shortness of breath that gets worse with physical activity. This information is not intended to replace advice given to you by your health care provider. Make sure you discuss any questions you have with your health care provider. Document Revised: 09/14/2018 Document Reviewed: 09/14/2018 Elsevier Patient Education  2022 Elsevier Inc.  

## 2021-08-07 ENCOUNTER — Inpatient Hospital Stay: Payer: Managed Care, Other (non HMO)

## 2021-08-23 LAB — HM MAMMOGRAPHY

## 2021-08-29 ENCOUNTER — Encounter: Payer: Self-pay | Admitting: Family Medicine

## 2021-08-29 ENCOUNTER — Other Ambulatory Visit: Payer: Self-pay | Admitting: Pharmacist

## 2021-08-30 ENCOUNTER — Inpatient Hospital Stay: Payer: Managed Care, Other (non HMO) | Attending: Oncology

## 2021-08-30 VITALS — BP 108/82 | HR 90 | Temp 99.0°F | Resp 18 | Ht 69.5 in | Wt 232.0 lb

## 2021-08-30 DIAGNOSIS — E538 Deficiency of other specified B group vitamins: Secondary | ICD-10-CM | POA: Diagnosis not present

## 2021-08-30 DIAGNOSIS — D509 Iron deficiency anemia, unspecified: Secondary | ICD-10-CM | POA: Insufficient documentation

## 2021-08-30 DIAGNOSIS — Z9884 Bariatric surgery status: Secondary | ICD-10-CM | POA: Insufficient documentation

## 2021-08-30 MED ORDER — CYANOCOBALAMIN 1000 MCG/ML IJ SOLN
1000.0000 ug | Freq: Once | INTRAMUSCULAR | Status: AC
  Administered 2021-08-30: 1000 ug via INTRAMUSCULAR
  Filled 2021-08-30: qty 1

## 2021-08-30 NOTE — Patient Instructions (Signed)
Vitamin B12 Injection What is this medication? Vitamin B12 (VAHY tuh min B12) prevents and treats low vitamin B12 levels in your body. It is used in people who do not get enough vitamin B12 from their diet or when their digestive tract does not absorb enough. Vitamin B12 plays an important role in maintaining the health of your nervous system and red blood cells. This medicine may be used for other purposes; ask your health care provider or pharmacist if you have questions. COMMON BRAND NAME(S): B-12 Compliance Kit, B-12 Injection Kit, Cyomin, Dodex, LA-12, Nutri-Twelve, Physicians EZ Use B-12, Primabalt What should I tell my care team before I take this medication? They need to know if you have any of these conditions: Kidney disease Leber's disease Megaloblastic anemia An unusual or allergic reaction to cyanocobalamin, cobalt, other medications, foods, dyes, or preservatives Pregnant or trying to get pregnant Breast-feeding How should I use this medication? This medication is injected into a muscle or deeply under the skin. It is usually given in a clinic or care team's office. However, your care team may teach you how to inject yourself. Follow all instructions. Talk to your care team about the use of this medication in children. Special care may be needed. Overdosage: If you think you have taken too much of this medicine contact a poison control center or emergency room at once. NOTE: This medicine is only for you. Do not share this medicine with others. What if I miss a dose? If you are given your dose at a clinic or care team's office, call to reschedule your appointment. If you give your own injections, and you miss a dose, take it as soon as you can. If it is almost time for your next dose, take only that dose. Do not take double or extra doses. What may interact with this medication? Alcohol Colchicine This list may not describe all possible interactions. Give your health care  provider a list of all the medicines, herbs, non-prescription drugs, or dietary supplements you use. Also tell them if you smoke, drink alcohol, or use illegal drugs. Some items may interact with your medicine. What should I watch for while using this medication? Visit your care team regularly. You may need blood work done while you are taking this medication. You may need to follow a special diet. Talk to your care team. Limit your alcohol intake and avoid smoking to get the best benefit. What side effects may I notice from receiving this medication? Side effects that you should report to your care team as soon as possible: Allergic reactions--skin rash, itching, hives, swelling of the face, lips, tongue, or throat Swelling of the ankles, hands, or feet Trouble breathing Side effects that usually do not require medical attention (report to your care team if they continue or are bothersome): Diarrhea This list may not describe all possible side effects. Call your doctor for medical advice about side effects. You may report side effects to FDA at 1-800-FDA-1088. Where should I keep my medication? Keep out of the reach of children. Store at room temperature between 15 and 30 degrees C (59 and 85 degrees F). Protect from light. Throw away any unused medication after the expiration date. NOTE: This sheet is a summary. It may not cover all possible information. If you have questions about this medicine, talk to your doctor, pharmacist, or health care provider.  2023 Elsevier/Gold Standard (2020-03-15 00:00:00)

## 2021-09-09 ENCOUNTER — Ambulatory Visit: Payer: Self-pay

## 2021-09-13 ENCOUNTER — Encounter: Payer: Managed Care, Other (non HMO) | Admitting: Family Medicine

## 2021-09-25 ENCOUNTER — Ambulatory Visit: Payer: Managed Care, Other (non HMO) | Admitting: Family Medicine

## 2021-09-25 VITALS — BP 118/86 | HR 74 | Temp 98.4°F | Ht 69.5 in | Wt 230.0 lb

## 2021-09-25 DIAGNOSIS — E118 Type 2 diabetes mellitus with unspecified complications: Secondary | ICD-10-CM

## 2021-09-25 DIAGNOSIS — F321 Major depressive disorder, single episode, moderate: Secondary | ICD-10-CM

## 2021-09-25 DIAGNOSIS — Z9884 Bariatric surgery status: Secondary | ICD-10-CM | POA: Diagnosis not present

## 2021-09-25 DIAGNOSIS — Z0000 Encounter for general adult medical examination without abnormal findings: Secondary | ICD-10-CM

## 2021-09-25 DIAGNOSIS — F419 Anxiety disorder, unspecified: Secondary | ICD-10-CM | POA: Diagnosis not present

## 2021-09-25 LAB — COMPREHENSIVE METABOLIC PANEL
ALT: 38 U/L — ABNORMAL HIGH (ref 0–35)
AST: 29 U/L (ref 0–37)
Albumin: 3.8 g/dL (ref 3.5–5.2)
Alkaline Phosphatase: 80 U/L (ref 39–117)
BUN: 8 mg/dL (ref 6–23)
CO2: 28 mEq/L (ref 19–32)
Calcium: 9.1 mg/dL (ref 8.4–10.5)
Chloride: 104 mEq/L (ref 96–112)
Creatinine, Ser: 0.76 mg/dL (ref 0.40–1.20)
GFR: 96.95 mL/min (ref >60.00)
Glucose, Bld: 103 mg/dL — ABNORMAL HIGH (ref 70–99)
Potassium: 4 mEq/L (ref 3.5–5.1)
Sodium: 138 mEq/L (ref 135–145)
Total Bilirubin: 0.4 mg/dL (ref 0.2–1.2)
Total Protein: 7.3 g/dL (ref 6.0–8.3)

## 2021-09-25 LAB — CBC WITH DIFFERENTIAL/PLATELET
Basophils Absolute: 0 10*3/uL (ref 0.0–0.1)
Basophils Relative: 0.4 % (ref 0.0–3.0)
Eosinophils Absolute: 0.2 10*3/uL (ref 0.0–0.7)
Eosinophils Relative: 2.9 % (ref 0.0–5.0)
HCT: 42.2 % (ref 36.0–46.0)
Hemoglobin: 13.5 g/dL (ref 12.0–15.0)
Lymphocytes Relative: 29.7 % (ref 12.0–46.0)
Lymphs Abs: 1.7 10*3/uL (ref 0.7–4.0)
MCHC: 31.9 g/dL (ref 30.0–36.0)
MCV: 86.1 fl (ref 78.0–100.0)
Monocytes Absolute: 0.6 10*3/uL (ref 0.1–1.0)
Monocytes Relative: 10.5 % (ref 3.0–12.0)
Neutro Abs: 3.3 10*3/uL (ref 1.4–7.7)
Neutrophils Relative %: 56.5 % (ref 43.0–77.0)
Platelets: 289 10*3/uL (ref 150.0–400.0)
RBC: 4.91 Mil/uL (ref 3.87–5.11)
RDW: 15.1 % (ref 11.5–15.5)
WBC: 5.8 10*3/uL (ref 4.0–10.5)

## 2021-09-25 LAB — LIPID PANEL
Cholesterol: 162 mg/dL (ref 0–200)
HDL: 81.4 mg/dL (ref >39.00)
LDL Cholesterol: 70 mg/dL (ref 0–99)
NonHDL: 80.32
Total CHOL/HDL Ratio: 2
Triglycerides: 52 mg/dL (ref 0.0–149.0)
VLDL: 10.4 mg/dL (ref 0.0–40.0)

## 2021-09-25 LAB — MICROALBUMIN / CREATININE URINE RATIO
Creatinine,U: 241.4 mg/dL
Microalb Creat Ratio: 1.1 mg/g (ref 0.0–30.0)
Microalb, Ur: 2.7 mg/dL — ABNORMAL HIGH (ref 0.0–1.9)

## 2021-09-25 LAB — T4, FREE: Free T4: 0.75 ng/dL (ref 0.60–1.60)

## 2021-09-25 LAB — VITAMIN D 25 HYDROXY (VIT D DEFICIENCY, FRACTURES): VITD: 15.26 ng/mL — ABNORMAL LOW (ref 30.00–100.00)

## 2021-09-25 LAB — HEMOGLOBIN A1C: Hgb A1c MFr Bld: 7 % — ABNORMAL HIGH (ref 4.6–6.5)

## 2021-09-25 LAB — VITAMIN B12: Vitamin B-12: 278 pg/mL (ref 211–911)

## 2021-09-25 LAB — TSH: TSH: 1.85 u[IU]/mL (ref 0.35–5.50)

## 2021-09-25 MED ORDER — BLOOD GLUCOSE MONITOR KIT: PACK | 0 refills | Status: AC

## 2021-09-25 MED ORDER — SERTRALINE HCL 50 MG PO TABS: 75.0000 mg | ORAL_TABLET | Freq: Every day | ORAL | 1 refills | Status: DC

## 2021-09-25 NOTE — Progress Notes (Signed)
Subjective:     [name] [name] is a 41 y.o. female with pmh sig for obesity s/p gastric bypass, vitamin b12 and D deficiency, iron def, h/o gallstone pancreatitis, h/o depression who is here for a comprehensive physical exam. The patient reports doing well overall.  Taking Zoloft 75 mg daily which is helping.  Pt started iron infusions with Heme/Onc for anemia.  Has not yet heard anything about endocrinology referral for DM, hgb A1C 7.1% on 05/13/21.  Pt requesting glucometer.    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  Tobacco Use   Smoking status: Never   Smokeless tobacco: Never  Vaping Use   Vaping Use: Never used  Substance and Sexual Activity   Alcohol use: No    Alcohol/week: 0.0 standard drinks of alcohol   Drug use: No   Sexual activity: Yes    Birth control/protection: Condom    Comment: INTERCOUSRE AGE 78, MORE THAN 5 SEXUAL PARTNERS  Other Topics Concern   Not on file  Social History Narrative   Not on file   Social Determinants of Health   Financial Resource Strain: Not on file  Food Insecurity: Not on file  Transportation Needs: Not on file  Physical Activity: Not on file  Stress: Not on file  Social Connections: Not on file  Intimate Partner Violence: Not on file   Health Maintenance  Topic Date Due   FOOT EXAM  Never done   OPHTHALMOLOGY EXAM  Never done   COVID-19 Vaccine (4 - Pfizer risk series) 01/03/2020   INFLUENZA VACCINE  Never done   URINE MICROALBUMIN  08/31/2021   PAP SMEAR-Modifier  02/19/2022 (Originally 02/10/2020)   HEMOGLOBIN A1C  11/12/2021   TETANUS/TDAP  02/07/2022   Hepatitis C Screening  Completed   HIV Screening  Completed   HPV VACCINES  Aged Out    The following portions of the patient's history  were reviewed and updated as appropriate: allergies, current medications, past family history, past medical history, past social history, past surgical history, and problem list.  Review of Systems Pertinent items noted in HPI and remainder of comprehensive ROS otherwise negative.   Objective:    BP 118/86 (BP Location: Right Arm, Patient Position: Sitting, Cuff Size: Normal)   Pulse 74   Temp 98.4 F (36.9 C) (Oral)   Ht 5' 9.5" (1.765 m)   Wt 230 lb (104.3 kg)   SpO2 98%   BMI 33.48 kg/m  General appearance: alert, cooperative, and no distress Head: Normocephalic, without obvious abnormality, atraumatic Eyes: conjunctivae/corneas clear. PERRL, EOM's intact. Fundi benign. Ears: normal TM's and external ear canals both ears Nose: Nares normal. Septum midline. Mucosa normal. No drainage or sinus tenderness. Throat: lips, mucosa, and tongue normal; teeth and gums normal Neck: no adenopathy, no carotid bruit, no JVD, supple, symmetrical, trachea midline, and thyroid not enlarged, symmetric, no tenderness/mass/nodules Lungs: clear to auscultation bilaterally Heart: regular rate and rhythm, S1, S2 normal, no murmur, click, rub or gallop Abdomen: soft, non-tender; bowel sounds normal; no masses,  no organomegaly Extremities: extremities normal, atraumatic, no cyanosis or edema Pulses: 2+ and symmetric Skin: Skin color, texture, turgor normal. No rashes or lesions Lymph nodes: Cervical, supraclavicular, and axillary nodes  normal. Neurologic: Alert and oriented X 3, normal strength and tone. Normal symmetric reflexes. Normal coordination and gait       09/25/2021   11:21 AM 05/13/2021    2:40 PM 08/31/2020    2:13 PM  Depression screen PHQ 2/9  Decreased Interest 0 0 2  Down, Depressed, Hopeless 0 0 2  PHQ - 2 Score 0 0 4  Altered sleeping 0 2 2  Tired, decreased energy _0 Change in appetite _1 Feeling bad or failure about yourself  0 0 0  Trouble concentrating 0 0 0  Moving  slowly or fidgety/restless 0 0 0  Suicidal thoughts 0 0 0  PHQ-9 Score _2 Difficult doing work/chores Not difficult at all Not difficult at all Somewhat difficult    Assessment:    Healthy female exam.     Plan:    Anticipatory guidance given including wearing seatbelts, smoke detectors in the home, increasing physical activity, increasing p.o. intake of water and vegetables. -labs -immunizations reviewed -mammogram done 08/23/21 -pap done 02/10/20, repeat in 5 yrs -given handout Next CPE in 1 yr See After Visit Summary for Counseling Recommendations  Well adult exam - Plan: CBC with Differential/Platelet, TSH, T4, Free, Lipid panel  Controlled type 2 diabetes mellitus with complication, without long-term current use of insulin (HCC)  -diet controlled -last hgb A1C 7.1% on 05/13/21 -awaiting info regarding Endo referral - Plan: blood glucose meter kit and supplies KIT, Hemoglobin A1c, Lipid panel, Microalbumin/Creatinine Ratio, Urine  History of Roux-en-Y gastric bypass  -continue f/u with Heme/Onc for iron def/infusions -continue repleting vitamin deficiencies. - Plan: CBC with Differential/Platelet, CMP, Vitamin D, 25-hydroxy, Vitamin B12  Anxiety  -stable -continue zoloft 75 mg daily - Plan: sertraline (ZOLOFT) 50 MG tablet  Depression, major, single episode, moderate (HCC) -stable -phq 9 score 3  -continue counseling  -continue zoloft 75 mg daily - Plan: sertraline (ZOLOFT) 50 MG tablet  F/u in 3-4 months   Grier Mitts, MD

## 2021-09-25 NOTE — Patient Instructions (Signed)
Your prescription for Zoloft was refilled this visit.

## 2021-09-27 ENCOUNTER — Other Ambulatory Visit: Payer: Self-pay | Admitting: Family Medicine

## 2021-09-27 ENCOUNTER — Inpatient Hospital Stay: Payer: Managed Care, Other (non HMO) | Attending: Oncology

## 2021-09-27 VITALS — BP 111/70 | HR 91 | Temp 98.3°F | Resp 18 | Ht 69.5 in | Wt 229.0 lb

## 2021-09-27 DIAGNOSIS — Z9884 Bariatric surgery status: Secondary | ICD-10-CM | POA: Insufficient documentation

## 2021-09-27 DIAGNOSIS — D509 Iron deficiency anemia, unspecified: Secondary | ICD-10-CM | POA: Diagnosis not present

## 2021-09-27 DIAGNOSIS — E538 Deficiency of other specified B group vitamins: Secondary | ICD-10-CM | POA: Diagnosis present

## 2021-09-27 DIAGNOSIS — E559 Vitamin D deficiency, unspecified: Secondary | ICD-10-CM

## 2021-09-27 MED ORDER — CYANOCOBALAMIN 1000 MCG/ML IJ SOLN
1000.0000 ug | Freq: Once | INTRAMUSCULAR | Status: AC
  Administered 2021-09-27: 1000 ug via INTRAMUSCULAR
  Filled 2021-09-27: qty 1

## 2021-09-27 NOTE — Patient Instructions (Signed)
Vitamin B12 Injection What is this medication? Vitamin B12 (VAHY tuh min B12) prevents and treats low vitamin B12 levels in your body. It is used in people who do not get enough vitamin B12 from their diet or when their digestive tract does not absorb enough. Vitamin B12 plays an important role in maintaining the health of your nervous system and red blood cells. This medicine may be used for other purposes; ask your health care provider or pharmacist if you have questions. COMMON BRAND NAME(S): B-12 Compliance Kit, B-12 Injection Kit, Cyomin, Dodex, LA-12, Nutri-Twelve, Physicians EZ Use B-12, Primabalt What should I tell my care team before I take this medication? They need to know if you have any of these conditions: Kidney disease Leber's disease Megaloblastic anemia An unusual or allergic reaction to cyanocobalamin, cobalt, other medications, foods, dyes, or preservatives Pregnant or trying to get pregnant Breast-feeding How should I use this medication? This medication is injected into a muscle or deeply under the skin. It is usually given in a clinic or care team's office. However, your care team may teach you how to inject yourself. Follow all instructions. Talk to your care team about the use of this medication in children. Special care may be needed. Overdosage: If you think you have taken too much of this medicine contact a poison control center or emergency room at once. NOTE: This medicine is only for you. Do not share this medicine with others. What if I miss a dose? If you are given your dose at a clinic or care team's office, call to reschedule your appointment. If you give your own injections, and you miss a dose, take it as soon as you can. If it is almost time for your next dose, take only that dose. Do not take double or extra doses. What may interact with this medication? Alcohol Colchicine This list may not describe all possible interactions. Give your health care  provider a list of all the medicines, herbs, non-prescription drugs, or dietary supplements you use. Also tell them if you smoke, drink alcohol, or use illegal drugs. Some items may interact with your medicine. What should I watch for while using this medication? Visit your care team regularly. You may need blood work done while you are taking this medication. You may need to follow a special diet. Talk to your care team. Limit your alcohol intake and avoid smoking to get the best benefit. What side effects may I notice from receiving this medication? Side effects that you should report to your care team as soon as possible: Allergic reactions--skin rash, itching, hives, swelling of the face, lips, tongue, or throat Swelling of the ankles, hands, or feet Trouble breathing Side effects that usually do not require medical attention (report to your care team if they continue or are bothersome): Diarrhea This list may not describe all possible side effects. Call your doctor for medical advice about side effects. You may report side effects to FDA at 1-800-FDA-1088. Where should I keep my medication? Keep out of the reach of children. Store at room temperature between 15 and 30 degrees C (59 and 85 degrees F). Protect from light. Throw away any unused medication after the expiration date. NOTE: This sheet is a summary. It may not cover all possible information. If you have questions about this medicine, talk to your doctor, pharmacist, or health care provider.  2023 Elsevier/Gold Standard (2020-03-15 00:00:00)

## 2021-09-30 ENCOUNTER — Encounter: Payer: Self-pay | Admitting: Family Medicine

## 2021-10-01 ENCOUNTER — Other Ambulatory Visit: Payer: Self-pay | Admitting: Family Medicine

## 2021-10-01 DIAGNOSIS — Z9884 Bariatric surgery status: Secondary | ICD-10-CM

## 2021-10-01 DIAGNOSIS — E559 Vitamin D deficiency, unspecified: Secondary | ICD-10-CM

## 2021-10-01 MED ORDER — VITAMIN D (ERGOCALCIFEROL) 1.25 MG (50000 UNIT) PO CAPS: 50000.0000 [IU] | ORAL_CAPSULE | ORAL | 0 refills | Status: DC

## 2021-10-09 ENCOUNTER — Encounter: Payer: Self-pay | Admitting: Family Medicine

## 2021-10-09 DIAGNOSIS — S161XXS Strain of muscle, fascia and tendon at neck level, sequela: Secondary | ICD-10-CM

## 2021-10-09 DIAGNOSIS — M545 Low back pain, unspecified: Secondary | ICD-10-CM

## 2021-10-13 NOTE — Progress Notes (Signed)
[facility]  [address] [place],  [place]  [address] [phone]  Clinic Day:  10/14/2021  Referring physician: Billie Ruddy, MD   HISTORY OF PRESENT ILLNESS:  The patient is a 41 y.o. female  with iron deficiency anemia secondary to her previous gastric bypass surgery.  She comes in today to reassess her labs after recently receiving IV iron.  Overall, she claims to feel better, but still has a marginal degree of fatigue.  Of note, she still has menstrual cycles, but they are not particularly heavy.  She denies having other overt forms of blood loss.    PHYSICAL EXAM:  Blood pressure 116/72, pulse 91, temperature 98.6 F (37 C), resp. rate 16, height 5' 9.5" (1.765 m), weight 213 lb 11.2 oz (96.9 kg), SpO2 96 %. Wt Readings from Last 3 Encounters:  10/14/21 213 lb 11.2 oz (96.9 kg)  09/27/21 229 lb (103.9 kg)  09/25/21 230 lb (104.3 kg)   Body mass index is 31.11 kg/m. Performance status (ECOG): 0 - Asymptomatic Physical Exam Constitutional:      Appearance: Normal appearance. She is not ill-appearing.  HENT:     Mouth/Throat:     Mouth: Mucous membranes are moist.     Pharynx: Oropharynx is clear. No oropharyngeal exudate or posterior oropharyngeal erythema.  Cardiovascular:     Rate and Rhythm: Normal rate and regular rhythm.     Heart sounds: No murmur heard.    No friction rub. No gallop.  Pulmonary:     Effort: Pulmonary effort is normal. No respiratory distress.     Breath sounds: Normal breath sounds. No wheezing, rhonchi or rales.  Abdominal:     General: Bowel sounds are normal. There is no distension.     Palpations: Abdomen is soft. There is no mass.     Tenderness: There is no abdominal tenderness.  Musculoskeletal:        General: No swelling.     Right lower leg: No edema.     Left lower leg: No edema.  Lymphadenopathy:     Cervical: No cervical adenopathy.     Upper Body:     Right upper body: No  supraclavicular or axillary adenopathy.     Left upper body: No supraclavicular or axillary adenopathy.     Lower Body: No right inguinal adenopathy. No left inguinal adenopathy.  Skin:    General: Skin is warm.     Coloration: Skin is not jaundiced.     Findings: No lesion or rash.  Neurological:     General: No focal deficit present.     Mental Status: She is alert and oriented to person, place, and time. Mental status is at baseline.  Psychiatric:        Mood and Affect: Mood normal.        Behavior: Behavior normal.        Thought Content: Thought content normal.   LABS:      Latest Ref Rng & Units 10/14/2021   12:00 AM 09/25/2021   11:56 AM 06/14/2021   12:00 AM  CBC  WBC  5.7     5.8  5.6      Hemoglobin 12.0 - 16.0 13.5     13.5  12.1      Hematocrit 36 - 46 42     42.2  39      Platelets 150 - 400 K/uL 284     289.0  246  This result is from an external source.    Latest Reference Range & Units 09/25/21 11:56  Vitamin B12 211 - 911 pg/mL 278    Latest Reference Range & Units Most Recent 05/13/21 15:13  Iron 28 - 170 ug/dL 81 10/14/21 11:59 55  UIBC ug/dL 341 10/14/21 11:59   TIBC 250 - 450 ug/dL 422 10/14/21 11:59 485 (H)  %SAT 16 - 45 % (calc) 11 (L) 05/13/21 15:13 11 (L)  Saturation Ratios 10.4 - 31.8 % 19 10/14/21 11:59   Ferritin 11 - 307 ng/mL 57 10/14/21 11:59 6 (L)  (H): Data is abnormally high (L): Data is abnormally low  ASSESSMENT & PLAN:  A 41 y.o. female with iron deficiency anemia secondary to previous gastric bypass surgery.  I am pleased as her hemoglobin at 13.5 is better after recently receiving IV iron.  Her iron studies today also show improvement after receiving IV iron.  Her recent vitamin B12 level also came back normal.  Clinically, the patient appears to be doing well.  I will see her back in 6 months for repeat clinical assessment.  The patient understands all the plans discussed today and is in agreement with them.  Leidi Astle Macarthur Critchley,  MD

## 2021-10-14 ENCOUNTER — Inpatient Hospital Stay: Payer: Managed Care, Other (non HMO)

## 2021-10-14 ENCOUNTER — Other Ambulatory Visit: Payer: Self-pay | Admitting: Oncology

## 2021-10-14 ENCOUNTER — Ambulatory Visit: Payer: Managed Care, Other (non HMO)

## 2021-10-14 ENCOUNTER — Inpatient Hospital Stay: Payer: Managed Care, Other (non HMO) | Admitting: Oncology

## 2021-10-14 VITALS — BP 116/72 | HR 91 | Temp 98.6°F | Resp 16 | Ht 69.5 in | Wt 213.7 lb

## 2021-10-14 DIAGNOSIS — D509 Iron deficiency anemia, unspecified: Secondary | ICD-10-CM

## 2021-10-14 DIAGNOSIS — D508 Other iron deficiency anemias: Secondary | ICD-10-CM | POA: Diagnosis not present

## 2021-10-14 DIAGNOSIS — E538 Deficiency of other specified B group vitamins: Secondary | ICD-10-CM | POA: Diagnosis not present

## 2021-10-14 LAB — IRON AND TIBC
Iron: 81 ug/dL (ref 28–170)
Saturation Ratios: 19 % (ref 10.4–31.8)
TIBC: 422 ug/dL (ref 250–450)
UIBC: 341 ug/dL

## 2021-10-14 LAB — CBC AND DIFFERENTIAL
HCT: 42 (ref 36–46)
Hemoglobin: 13.5 (ref 12.0–16.0)
Neutrophils Absolute: 3.25
Platelets: 284 10*3/uL (ref 150–400)
WBC: 5.7

## 2021-10-14 LAB — CBC: RBC: 4.94 (ref 3.87–5.11)

## 2021-10-14 LAB — FERRITIN: Ferritin: 57 ng/mL (ref 11–307)

## 2021-10-24 ENCOUNTER — Other Ambulatory Visit: Payer: Self-pay | Admitting: Family Medicine

## 2021-10-25 ENCOUNTER — Inpatient Hospital Stay: Payer: Managed Care, Other (non HMO) | Attending: Oncology

## 2021-10-25 VITALS — BP 112/81 | HR 82 | Temp 97.9°F | Ht 69.5 in | Wt 233.1 lb

## 2021-10-25 DIAGNOSIS — Z9884 Bariatric surgery status: Secondary | ICD-10-CM

## 2021-10-25 DIAGNOSIS — E538 Deficiency of other specified B group vitamins: Secondary | ICD-10-CM | POA: Diagnosis present

## 2021-10-25 DIAGNOSIS — D509 Iron deficiency anemia, unspecified: Secondary | ICD-10-CM

## 2021-10-25 MED ORDER — CYANOCOBALAMIN 1000 MCG/ML IJ SOLN
1000.0000 ug | Freq: Once | INTRAMUSCULAR | Status: AC
  Administered 2021-10-25: 1000 ug via INTRAMUSCULAR
  Filled 2021-10-25: qty 1

## 2021-10-28 ENCOUNTER — Ambulatory Visit: Payer: Managed Care, Other (non HMO) | Admitting: Physician Assistant

## 2021-11-08 NOTE — Telephone Encounter (Signed)
Ok for PT referral 

## 2021-11-22 ENCOUNTER — Inpatient Hospital Stay: Payer: Managed Care, Other (non HMO) | Attending: Oncology

## 2021-11-22 VITALS — BP 110/67 | HR 98 | Temp 97.8°F | Resp 16 | Ht 69.5 in | Wt 233.0 lb

## 2021-11-22 DIAGNOSIS — D509 Iron deficiency anemia, unspecified: Secondary | ICD-10-CM

## 2021-11-22 DIAGNOSIS — E538 Deficiency of other specified B group vitamins: Secondary | ICD-10-CM | POA: Insufficient documentation

## 2021-11-22 DIAGNOSIS — Z9884 Bariatric surgery status: Secondary | ICD-10-CM

## 2021-11-22 MED ORDER — CYANOCOBALAMIN 1000 MCG/ML IJ SOLN
1000.0000 ug | Freq: Once | INTRAMUSCULAR | Status: AC
  Administered 2021-11-22: 1000 ug via INTRAMUSCULAR
  Filled 2021-11-22: qty 1

## 2021-11-22 NOTE — Patient Instructions (Signed)

## 2021-12-20 ENCOUNTER — Inpatient Hospital Stay: Payer: Managed Care, Other (non HMO)

## 2021-12-26 ENCOUNTER — Encounter: Payer: Self-pay | Admitting: Oncology

## 2021-12-27 ENCOUNTER — Inpatient Hospital Stay: Payer: Managed Care, Other (non HMO) | Attending: Oncology

## 2022-01-17 ENCOUNTER — Ambulatory Visit: Payer: Managed Care, Other (non HMO)

## 2022-01-24 ENCOUNTER — Encounter: Payer: Self-pay | Admitting: Oncology

## 2022-01-27 ENCOUNTER — Inpatient Hospital Stay: Payer: Managed Care, Other (non HMO) | Attending: Oncology

## 2022-02-14 ENCOUNTER — Ambulatory Visit: Payer: Managed Care, Other (non HMO)

## 2022-02-26 ENCOUNTER — Telehealth: Payer: Self-pay | Admitting: Oncology

## 2022-02-26 NOTE — Telephone Encounter (Signed)
02/26/22 Patient cancelled B12 appts.Stated [facility].

## 2022-02-27 ENCOUNTER — Inpatient Hospital Stay: Payer: Managed Care, Other (non HMO) | Attending: Oncology

## 2022-03-14 ENCOUNTER — Ambulatory Visit: Payer: Managed Care, Other (non HMO)

## 2022-03-28 ENCOUNTER — Ambulatory Visit: Payer: Managed Care, Other (non HMO)

## 2022-04-11 ENCOUNTER — Ambulatory Visit: Payer: Managed Care, Other (non HMO)

## 2022-04-13 NOTE — Progress Notes (Unsigned)
[facility]  [address] [place],  [place]  [address] [phone]  Clinic Day:  10/14/2021  Referring physician: Billie Ruddy, MD   HISTORY OF PRESENT ILLNESS:  The patient is a 42 y.o. female  with iron deficiency anemia secondary to her previous gastric bypass surgery.  She comes in today to reassess her labs after recently receiving IV iron.  Overall, she claims to feel better, but still has a marginal degree of fatigue.  Of note, she still has menstrual cycles, but they are not particularly heavy.  She denies having other overt forms of blood loss.    PHYSICAL EXAM:  There were no vitals taken for this visit. Wt Readings from Last 3 Encounters:  11/22/21 233 lb (105.7 kg)  10/25/21 233 lb 1.9 oz (105.7 kg)  10/14/21 213 lb 11.2 oz (96.9 kg)   There is no height or weight on file to calculate BMI. Performance status (ECOG): 0 - Asymptomatic Physical Exam Constitutional:      Appearance: Normal appearance. She is not ill-appearing.  HENT:     Mouth/Throat:     Mouth: Mucous membranes are moist.     Pharynx: Oropharynx is clear. No oropharyngeal exudate or posterior oropharyngeal erythema.  Cardiovascular:     Rate and Rhythm: Normal rate and regular rhythm.     Heart sounds: No murmur heard.    No friction rub. No gallop.  Pulmonary:     Effort: Pulmonary effort is normal. No respiratory distress.     Breath sounds: Normal breath sounds. No wheezing, rhonchi or rales.  Abdominal:     General: Bowel sounds are normal. There is no distension.     Palpations: Abdomen is soft. There is no mass.     Tenderness: There is no abdominal tenderness.  Musculoskeletal:        General: No swelling.     Right lower leg: No edema.     Left lower leg: No edema.  Lymphadenopathy:     Cervical: No cervical adenopathy.     Upper Body:     Right upper body: No supraclavicular or axillary adenopathy.     Left upper body: No supraclavicular or  axillary adenopathy.     Lower Body: No right inguinal adenopathy. No left inguinal adenopathy.  Skin:    General: Skin is warm.     Coloration: Skin is not jaundiced.     Findings: No lesion or rash.  Neurological:     General: No focal deficit present.     Mental Status: She is alert and oriented to person, place, and time. Mental status is at baseline.  Psychiatric:        Mood and Affect: Mood normal.        Behavior: Behavior normal.        Thought Content: Thought content normal.    LABS:      Latest Ref Rng & Units 10/14/2021   12:00 AM 09/25/2021   11:56 AM 06/14/2021   12:00 AM  CBC  WBC  5.7     5.8  5.6      Hemoglobin 12.0 - 16.0 13.5     13.5  12.1      Hematocrit 36 - 46 42     42.2  39      Platelets 150 - 400 K/uL 284     289.0  246         This result is from an external source.  Latest Reference Range & Units 09/25/21 11:56  Vitamin B12 211 - 911 pg/mL 278    Latest Reference Range & Units Most Recent 05/13/21 15:13  Iron 28 - 170 ug/dL 81 10/14/21 11:59 55  UIBC ug/dL 341 10/14/21 11:59   TIBC 250 - 450 ug/dL 422 10/14/21 11:59 485 (H)  %SAT 16 - 45 % (calc) 11 (L) 05/13/21 15:13 11 (L)  Saturation Ratios 10.4 - 31.8 % 19 10/14/21 11:59   Ferritin 11 - 307 ng/mL 57 10/14/21 11:59 6 (L)  (H): Data is abnormally high (L): Data is abnormally low  ASSESSMENT & PLAN:  A 42 y.o. female with iron deficiency anemia secondary to previous gastric bypass surgery.  I am pleased as her hemoglobin at 13.5 is better after recently receiving IV iron.  Her iron studies today also show improvement after receiving IV iron.  Her recent vitamin B12 level also came back normal.  Clinically, the patient appears to be doing well.  I will see her back in 6 months for repeat clinical assessment.  The patient understands all the plans discussed today and is in agreement with them.  Imoni Kohen Macarthur Critchley, MD

## 2022-04-14 ENCOUNTER — Inpatient Hospital Stay: Payer: Managed Care, Other (non HMO) | Attending: Oncology | Admitting: Oncology

## 2022-04-14 ENCOUNTER — Other Ambulatory Visit: Payer: Self-pay | Admitting: Oncology

## 2022-04-14 ENCOUNTER — Telehealth: Payer: Self-pay | Admitting: Oncology

## 2022-04-14 ENCOUNTER — Inpatient Hospital Stay: Payer: Managed Care, Other (non HMO)

## 2022-04-14 VITALS — BP 111/80 | HR 98 | Temp 98.5°F | Resp 16 | Ht 69.5 in | Wt 231.0 lb

## 2022-04-14 DIAGNOSIS — Z9884 Bariatric surgery status: Secondary | ICD-10-CM

## 2022-04-14 DIAGNOSIS — D509 Iron deficiency anemia, unspecified: Secondary | ICD-10-CM

## 2022-04-14 DIAGNOSIS — Z8639 Personal history of other endocrine, nutritional and metabolic disease: Secondary | ICD-10-CM | POA: Diagnosis not present

## 2022-04-14 DIAGNOSIS — D508 Other iron deficiency anemias: Secondary | ICD-10-CM | POA: Diagnosis not present

## 2022-04-14 DIAGNOSIS — Z862 Personal history of diseases of the blood and blood-forming organs and certain disorders involving the immune mechanism: Secondary | ICD-10-CM | POA: Insufficient documentation

## 2022-04-14 LAB — CBC AND DIFFERENTIAL
HCT: 41 (ref 36–46)
Hemoglobin: 13.2 (ref 12.0–16.0)
Neutrophils Absolute: 4.23
Platelets: 308 10*3/uL (ref 150–400)
WBC: 6.5

## 2022-04-14 LAB — IRON AND TIBC
Iron: 77 ug/dL (ref 28–170)
Saturation Ratios: 18 % (ref 10.4–31.8)
TIBC: 440 ug/dL (ref 250–450)
UIBC: 363 ug/dL

## 2022-04-14 LAB — FERRITIN: Ferritin: 23 ng/mL (ref 11–307)

## 2022-04-14 LAB — FOLATE: Folate: 6.7 ng/mL (ref >5.9)

## 2022-04-14 LAB — VITAMIN B12: Vitamin B-12: 367 pg/mL (ref 180–914)

## 2022-04-14 LAB — CBC: RBC: 4.78 (ref 3.87–5.11)

## 2022-04-14 NOTE — Telephone Encounter (Signed)
04/14/22 Next appt scheduled and confirmed with patient

## 2022-06-23 ENCOUNTER — Ambulatory Visit: Payer: Managed Care, Other (non HMO) | Admitting: Family Medicine

## 2022-06-23 VITALS — BP 124/80 | HR 93 | Temp 98.6°F | Wt 224.2 lb

## 2022-06-23 DIAGNOSIS — E118 Type 2 diabetes mellitus with unspecified complications: Secondary | ICD-10-CM | POA: Diagnosis not present

## 2022-06-23 DIAGNOSIS — F411 Generalized anxiety disorder: Secondary | ICD-10-CM

## 2022-06-23 DIAGNOSIS — F330 Major depressive disorder, recurrent, mild: Secondary | ICD-10-CM

## 2022-06-23 DIAGNOSIS — E669 Obesity, unspecified: Secondary | ICD-10-CM

## 2022-06-23 DIAGNOSIS — E66811 Obesity, class 1: Secondary | ICD-10-CM

## 2022-06-23 DIAGNOSIS — Z7985 Long-term (current) use of injectable non-insulin antidiabetic drugs: Secondary | ICD-10-CM

## 2022-06-23 DIAGNOSIS — E538 Deficiency of other specified B group vitamins: Secondary | ICD-10-CM

## 2022-06-23 DIAGNOSIS — Z6832 Body mass index (BMI) 32.0-32.9, adult: Secondary | ICD-10-CM

## 2022-06-23 MED ORDER — SERTRALINE HCL 25 MG PO TABS: 25.0000 mg | ORAL_TABLET | Freq: Every day | ORAL | 1 refills | Status: DC

## 2022-06-23 MED ORDER — CYANOCOBALAMIN 1000 MCG/ML IJ SOLN: 1000.0000 ug | INTRAMUSCULAR | 0 refills | Status: DC

## 2022-06-23 MED ORDER — SEMAGLUTIDE (2 MG/DOSE) 8 MG/3ML ~~LOC~~ SOPN: 2.0000 mg | PEN_INJECTOR | SUBCUTANEOUS | 3 refills | Status: DC

## 2022-06-23 NOTE — Progress Notes (Signed)
Established Patient Office Visit   Subjective  Patient ID: [name] [name], female    DOB: 1980/09/24  Age: 42 y.o. MRN: [id number]  Chief Complaint  Patient presents with   Medication Refill    Zoloft, was informed she needed a visit.    Pt is a 42 yo female seen for f/u and med refill.  Pt states she has been busy travelling for work.  Feels like needs to restart Zoloft.  Notes mood swings and crying.  Still in counseling.    Pt has [facility].  On Ozempic 2 mg wkly for the last 6 months.  A1C was 6.7% 2-3 weeks ago.  A1C was 7.0% on 09/25/21.  Patient also receiving B12 injections.  Weight prior to starting medication 234 lbs.  Medication Refill    Past Medical History:  Diagnosis Date   Anxiety    Depression    Diabetes mellitus 05/2008   DR. BALAN   GERD (gastroesophageal reflux disease)    after taking antibiotics it went away   Hypertension    Obesity    Past Surgical History:  Procedure Laterality Date   BREATH TEK H PYLORI N/A 08/20/2012   Procedure: BREATH TEK H PYLORI;  Surgeon: Lodema Pilot, DO;  Location: [facility];  Service: Endoscopy;  Laterality: N/A;   CHOLECYSTECTOMY N/A 02/22/2020   Procedure: LAPAROSCOPIC CHOLECYSTECTOMY WITH INTRAOPERATIVE CHOLANGIOGRAM;  Surgeon: Luretha Murphy, MD;  Location: [facility];  Service: General;  Laterality: N/A;   GASTRIC ROUX-EN-Y N/A 08/24/2012   Procedure: LAPAROSCOPIC ROUX-EN-Y GASTRIC BYPASS WITH UPPER ENDOSCOPY;  Surgeon: Lodema Pilot, DO;  Location: [facility];  Service: General;  Laterality: N/A;   Social History   Tobacco Use   Smoking status: Never   Smokeless tobacco: Never  Vaping Use   Vaping Use: Never used  Substance Use Topics   Alcohol use: No    Alcohol/week: 0.0 standard drinks of alcohol   Drug use: No   Family History  Problem Relation Age of Onset   Diabetes Mother    Hypertension Mother    Miscarriages / [place] Mother    Mental illness Brother     Diabetes Maternal Aunt    Dementia Maternal Grandmother    Diabetes Maternal Grandmother    Diabetes Maternal Grandfather    Allergies  Allergen Reactions   Metformin And Related Itching      ROS Negative unless stated above    Objective:     BP 124/80 (BP Location: Right Arm, Patient Position: Sitting, Cuff Size: Normal)   Pulse 93   Temp 98.6 F (37 C) (Oral)   Wt 224 lb 3.2 oz (101.7 kg)   SpO2 97%   BMI 32.63 kg/m  BP Readings from Last 3 Encounters:  06/23/22 124/80  04/14/22 111/80  11/22/21 110/67   Wt Readings from Last 3 Encounters:  06/23/22 224 lb 3.2 oz (101.7 kg)  04/14/22 231 lb (104.8 kg)  11/22/21 233 lb (105.7 kg)      Physical Exam Constitutional:      General: She is not in acute distress.    Appearance: Normal appearance.  HENT:     Head: Normocephalic and atraumatic.     Nose: Nose normal.     Mouth/Throat:     Mouth: Mucous membranes are moist.  Cardiovascular:     Rate and Rhythm: Normal rate and regular rhythm.     Heart sounds: Normal heart sounds. No murmur heard.  No gallop.  Pulmonary:     Effort: Pulmonary effort is normal. No respiratory distress.     Breath sounds: Normal breath sounds. No wheezing, rhonchi or rales.  Skin:    General: Skin is warm and dry.  Neurological:     Mental Status: She is alert and oriented to person, place, and time.      No results found for any visits on 06/23/22.     06/23/2022   10:17 AM 09/25/2021   11:21 AM 05/13/2021    2:40 PM  Depression screen PHQ 2/9  Decreased Interest 1 0 0  Down, Depressed, Hopeless 2 0 0  PHQ - 2 Score 3 0 0  Altered sleeping 3 0 2  Tired, decreased energy 3 2 2   Change in appetite 2 1 2   Feeling bad or failure about yourself  0 0 0  Trouble concentrating 0 0 0  Moving slowly or fidgety/restless 0 0 0  Suicidal thoughts 0 0 0  PHQ-9 Score 11 3 6   Difficult doing work/chores Somewhat difficult Not difficult at all Not difficult at all      06/23/2022    10:17 AM 08/31/2020    2:13 PM 11/21/2019    4:26 PM  GAD 7 : Generalized Anxiety Score  Nervous, Anxious, on Edge 3 2 3   Control/stop worrying 1 1 2   Worry too much - different things 3 1 2   Trouble relaxing 3 1 3   Restless 1 0 0  Easily annoyed or irritable 3 2 3   Afraid - awful might happen 1 0 0  Total GAD 7 Score 15 7 13   Anxiety Difficulty Somewhat difficult Somewhat difficult          Assessment & Plan:  GAD (generalized anxiety disorder) -GAD7 score 15 -restart zoloft 25 mg daily -continue counseling -     Sertraline HCl; Take 1 tablet (25 mg total) by mouth daily.  Dispense: 90 tablet; Refill: 1  Class 1 obesity with serious comorbidity and body mass index (BMI) of 32.0 to 32.9 in adult, unspecified obesity type -Body mass index is 32.63 kg/m. -s/p gastric bypass in 2014 -lifestyle modifications -daily BMVI -     Semaglutide (2 MG/DOSE); Inject 2 mg as directed once a week.  Dispense: 3 mL; Refill: 3  Vitamin B12 deficiency -     Cyanocobalamin; Inject 1 mL (1,000 mcg total) into the muscle every 30 (thirty) days.  Dispense: 10 mL; Refill: 0  Controlled type 2 diabetes mellitus with complication, without long-term current use of insulin (HCC) -obtain records for recent A1C or 6.7% a few wks ago. -last A1C in clinic 7.0% 09/25/21 -lifestyle modifications -foot exam at next [facility] -pt to schedule eye exam -     Semaglutide (2 MG/DOSE); Inject 2 mg as directed once a week.  Dispense: 3 mL; Refill: 3  Mild episode of recurrent major depressive disorder (HCC) -PHQ 9 score 11 -restart zoloft -counseling -self care  Return in about 6 weeks (around 08/04/2022).   Deeann Saint, MD

## 2022-07-01 ENCOUNTER — Encounter: Payer: Self-pay | Admitting: Family Medicine

## 2022-10-08 ENCOUNTER — Ambulatory Visit: Payer: Managed Care, Other (non HMO) | Admitting: Dermatology

## 2022-10-14 NOTE — Progress Notes (Unsigned)
[facility]  [address] [place],  [place]  [address] [phone]  Clinic Day:  10/15/2022  Referring physician: Deeann Saint, MD   HISTORY OF PRESENT ILLNESS:  The patient is a 42 y.o. female  with iron deficiency anemia secondary to her previous gastric bypass surgery.  She comes in today to reassess her labs. Overall, she claims to feel better, but still has a marginal degree of fatigue.  Of note, she still has menstrual cycles, but they are not particularly heavy.  She denies having other overt forms of blood loss.    PHYSICAL EXAM:  Blood pressure 115/72, pulse 93, temperature 98.6 F (37 C), resp. rate 14, height 5' 9.5" (1.765 m), weight 214 lb 6.4 oz (97.3 kg), SpO2 98%. Wt Readings from Last 3 Encounters:  10/15/22 214 lb 6.4 oz (97.3 kg)  06/23/22 224 lb 3.2 oz (101.7 kg)  04/14/22 231 lb (104.8 kg)   Body mass index is 31.21 kg/m. Performance status (ECOG): 0 - Asymptomatic Physical Exam Constitutional:      Appearance: Normal appearance. She is not ill-appearing.  HENT:     Mouth/Throat:     Mouth: Mucous membranes are moist.     Pharynx: Oropharynx is clear. No oropharyngeal exudate or posterior oropharyngeal erythema.  Cardiovascular:     Rate and Rhythm: Normal rate and regular rhythm.     Heart sounds: No murmur heard.    No friction rub. No gallop.  Pulmonary:     Effort: Pulmonary effort is normal. No respiratory distress.     Breath sounds: Normal breath sounds. No wheezing, rhonchi or rales.  Abdominal:     General: Bowel sounds are normal. There is no distension.     Palpations: Abdomen is soft. There is no mass.     Tenderness: There is no abdominal tenderness.  Musculoskeletal:        General: No swelling.     Right lower leg: No edema.     Left lower leg: No edema.  Lymphadenopathy:     Cervical: No cervical adenopathy.     Upper Body:     Right upper body: No supraclavicular or axillary adenopathy.      Left upper body: No supraclavicular or axillary adenopathy.     Lower Body: No right inguinal adenopathy. No left inguinal adenopathy.  Skin:    General: Skin is warm.     Coloration: Skin is not jaundiced.     Findings: No lesion or rash.  Neurological:     General: No focal deficit present.     Mental Status: She is alert and oriented to person, place, and time. Mental status is at baseline.  Psychiatric:        Mood and Affect: Mood normal.        Behavior: Behavior normal.        Thought Content: Thought content normal.    LABS:      Latest Ref Rng & Units 10/15/2022   12:00 AM 04/14/2022   12:00 AM 10/14/2021   12:00 AM  CBC  WBC  5.3     6.5     5.7      Hemoglobin 12.0 - 16.0 13.3     13.2     13.5      Hematocrit 36 - 46 40     41     42      Platelets 150 - 400 K/uL 253     308  284         This result is from an external source.    Latest Reference Range & Units 10/15/22 08:42  Iron 28 - 170 ug/dL 68  UIBC ug/dL 010  TIBC 272 - 536 ug/dL 644  Saturation Ratios 10.4 - 31.8 % 16  Ferritin 11 - 307 ng/mL 15  Folate >5.9 ng/mL 6.8  Vitamin B12 180 - 914 pg/mL 337   ASSESSMENT & PLAN:  A 42 y.o. female with iron deficiency anemia secondary to previous gastric bypass surgery.  I am pleased as her hemoglobin remains fine at 13.3.  Her iron studies today remain fine, as does her vitamin B12 level.  Clinically, the patient appears to be doing well.  I will see her back in another 6 months for repeat clinical assessment.  The patient understands all the plans discussed today and is in agreement with them.  Mykal Batiz Kirby Funk, MD

## 2022-10-15 ENCOUNTER — Inpatient Hospital Stay: Payer: Managed Care, Other (non HMO)

## 2022-10-15 ENCOUNTER — Other Ambulatory Visit: Payer: Self-pay | Admitting: Oncology

## 2022-10-15 ENCOUNTER — Inpatient Hospital Stay: Payer: Managed Care, Other (non HMO) | Attending: Oncology | Admitting: Oncology

## 2022-10-15 ENCOUNTER — Telehealth: Payer: Self-pay

## 2022-10-15 VITALS — BP 115/72 | HR 93 | Temp 98.6°F | Resp 14 | Ht 69.5 in | Wt 214.4 lb

## 2022-10-15 DIAGNOSIS — Z9884 Bariatric surgery status: Secondary | ICD-10-CM | POA: Diagnosis not present

## 2022-10-15 DIAGNOSIS — D508 Other iron deficiency anemias: Secondary | ICD-10-CM | POA: Insufficient documentation

## 2022-10-15 DIAGNOSIS — D509 Iron deficiency anemia, unspecified: Secondary | ICD-10-CM

## 2022-10-15 LAB — FOLATE: Folate: 6.8 ng/mL (ref >5.9)

## 2022-10-15 LAB — IRON AND TIBC
Iron: 68 ug/dL (ref 28–170)
Saturation Ratios: 16 % (ref 10.4–31.8)
TIBC: 438 ug/dL (ref 250–450)
UIBC: 370 ug/dL

## 2022-10-15 LAB — CBC AND DIFFERENTIAL
HCT: 40 (ref 36–46)
Hemoglobin: 13.3 (ref 12.0–16.0)
Neutrophils Absolute: 2.6
Platelets: 253 10*3/uL (ref 150–400)
WBC: 5.3

## 2022-10-15 LAB — FERRITIN: Ferritin: 15 ng/mL (ref 11–307)

## 2022-10-15 LAB — CBC: RBC: 4.68 (ref 3.87–5.11)

## 2022-10-15 LAB — VITAMIN B12: Vitamin B-12: 337 pg/mL (ref 180–914)

## 2022-10-15 NOTE — Telephone Encounter (Signed)
Called patient and informed her of  her lab results. Per Dr. Melvyn Neth Iron results are fine, slightly lower than last but nothing saying she needs iron infusion. Patient notified and will follow up in 6 months.

## 2022-11-16 ENCOUNTER — Other Ambulatory Visit: Payer: Self-pay

## 2022-11-16 ENCOUNTER — Emergency Department (HOSPITAL_BASED_OUTPATIENT_CLINIC_OR_DEPARTMENT_OTHER): Payer: Managed Care, Other (non HMO)

## 2022-11-16 ENCOUNTER — Encounter (HOSPITAL_BASED_OUTPATIENT_CLINIC_OR_DEPARTMENT_OTHER): Payer: Self-pay

## 2022-11-16 ENCOUNTER — Emergency Department (HOSPITAL_BASED_OUTPATIENT_CLINIC_OR_DEPARTMENT_OTHER)
Admission: EM | Admit: 2022-11-16 | Discharge: 2022-11-16 | Disposition: A | Discharge status: Home / Self Care | Payer: Managed Care, Other (non HMO) | Attending: Emergency Medicine | Admitting: Emergency Medicine

## 2022-11-16 DIAGNOSIS — I10 Essential (primary) hypertension: Secondary | ICD-10-CM | POA: Insufficient documentation

## 2022-11-16 DIAGNOSIS — R0789 Other chest pain: Secondary | ICD-10-CM | POA: Insufficient documentation

## 2022-11-16 DIAGNOSIS — R079 Chest pain, unspecified: Secondary | ICD-10-CM

## 2022-11-16 LAB — CBC
HCT: 41.5 % (ref 36.0–46.0)
Hemoglobin: 13.4 g/dL (ref 12.0–15.0)
MCH: 27.8 pg (ref 26.0–34.0)
MCHC: 32.3 g/dL (ref 30.0–36.0)
MCV: 86.1 fL (ref 80.0–100.0)
Platelets: 251 10*3/uL (ref 150–400)
RBC: 4.82 MIL/uL (ref 3.87–5.11)
RDW: 12.9 % (ref 11.5–15.5)
WBC: 7.1 10*3/uL (ref 4.0–10.5)
nRBC: 0 % (ref 0.0–0.2)

## 2022-11-16 LAB — BASIC METABOLIC PANEL
Anion gap: 10 (ref 5–15)
BUN: 8 mg/dL (ref 6–20)
CO2: 23 mmol/L (ref 22–32)
Calcium: 8.5 mg/dL — ABNORMAL LOW (ref 8.9–10.3)
Chloride: 102 mmol/L (ref 98–111)
Creatinine, Ser: 0.74 mg/dL (ref 0.44–1.00)
GFR, Estimated: >60 mL/min (ref >60)
Glucose, Bld: 99 mg/dL (ref 70–99)
Potassium: 3.6 mmol/L (ref 3.5–5.1)
Sodium: 135 mmol/L (ref 135–145)

## 2022-11-16 LAB — PREGNANCY, URINE: Preg Test, Ur: NEGATIVE

## 2022-11-16 LAB — TROPONIN I (HIGH SENSITIVITY)
Troponin I (High Sensitivity): <2 ng/L (ref <18)
Troponin I (High Sensitivity): <2 ng/L (ref <18)

## 2022-11-16 MED ORDER — ALBUTEROL SULFATE HFA 108 (90 BASE) MCG/ACT IN AERS
2.0000 | INHALATION_SPRAY | Freq: Once | RESPIRATORY_TRACT | Status: AC
  Administered 2022-11-16: 2 via RESPIRATORY_TRACT
  Filled 2022-11-16: qty 6.7

## 2022-11-16 MED ORDER — ACETAMINOPHEN 500 MG PO TABS
1000.0000 mg | ORAL_TABLET | Freq: Once | ORAL | Status: AC
  Administered 2022-11-16: 1000 mg via ORAL
  Filled 2022-11-16: qty 2

## 2022-11-16 MED ORDER — IOHEXOL 350 MG/ML SOLN
75.0000 mL | Freq: Once | INTRAVENOUS | Status: AC | PRN
  Administered 2022-11-16: 100 mL via INTRAVENOUS

## 2022-11-16 NOTE — ED Triage Notes (Signed)
Pt reports CP and tightness since this morning. Pt reports now feeling pain in the back of her neck.

## 2022-11-16 NOTE — ED Notes (Signed)
Dr Davis at bedside.

## 2022-11-16 NOTE — Discharge Instructions (Signed)
You were seen today for chest pain.  Your EKG, lab work and CT scan were reassuring.  I recommend you follow-up with your primary care doctor.  If you develop worsening pain, difficulty breathing or any other new concerning symptoms you should return to the ED.

## 2022-11-16 NOTE — ED Notes (Signed)
..  The patient is A&OX4, ambulatory at d/c with independent steady gait, NAD. Pt verbalized understanding of d/c instructions and follow up care.

## 2022-11-16 NOTE — Progress Notes (Signed)
RN gave patient albuterol treatment. 

## 2022-11-16 NOTE — ED Provider Notes (Signed)
[facility] Provider Note   CSN: [id number] Arrival date & time: 11/16/22  1851     History {Add pertinent medical, surgical, social history, OB history to HPI:1} Chief Complaint  Patient presents with   Chest Pain    [place] Virgie Hubel is a 42 y.o. female.   Chest Pain 42 year old female history of depression, GERD, hypertension presenting for chest pain.  She woke up this morning and developed some pain in the anterior part of her chest.  It feels somewhat tight and feels like there is a rating sensation.  She now feels like the pain is going towards her back.  Sometimes worse with movement, not reproducible with palpation.  No pleuritic pain, not short of breath.  No nausea vomiting or diaphoresis.  No history of ACS or stroke.  She does not smoke.  She has not had any trauma but did throw axes yesterday.     Home Medications Prior to Admission medications   Medication Sig Start Date End Date Taking? Authorizing Provider  blood glucose meter kit and supplies KIT Dispense based on patient and insurance preference. Use up to four times daily as directed. 09/25/21   Deeann Saint, MD  cetirizine (ZYRTEC) 10 MG tablet TAKE 1 TABLET BY MOUTH EVERY DAY Patient taking differently: Take 10 mg by mouth daily. 04/01/19   Deeann Saint, MD  cyanocobalamin (VITAMIN B12) 1000 MCG/ML injection Inject 1 mL (1,000 mcg total) into the muscle every 30 (thirty) days. 06/23/22   Deeann Saint, MD  FINACEA 15 % FOAM Apply 1 Application topically daily. 06/19/21   [provider]  glucose blood (ACCU-CHEK GUIDE) test strip Use up to four times daily to check blood sugar. 10/25/21   Deeann Saint, MD  Multiple Vitamin (MULTIVITAMIN) tablet Take 1 tablet by mouth daily.    [provider]  Semaglutide, 2 MG/DOSE, 8 MG/3ML SOPN Inject 2 mg as directed once a week. 06/23/22   Deeann Saint, MD  sertraline (ZOLOFT) 25 MG tablet Take  1 tablet (25 mg total) by mouth daily. 06/23/22   Deeann Saint, MD  Vitamin D, Ergocalciferol, (DRISDOL) 1.25 MG (50000 UNIT) CAPS capsule Take 1 capsule (50,000 Units total) by mouth every 7 (seven) days. Patient not taking: Reported on 06/23/2022 10/01/21   Deeann Saint, MD      Allergies    Metformin and related    Review of Systems   Review of Systems  Cardiovascular:  Positive for chest pain.  Review of systems completed and notable as per HPI.  ROS otherwise negative.   Physical Exam Updated Vital Signs BP (!) 141/99 (BP Location: Left Arm)   Pulse 95   Temp (!) 97.2 F (36.2 C)   Resp 18   Ht 5' 9.5" (1.765 m)   Wt 94.3 kg   LMP 10/26/2022 (Exact Date)   SpO2 100%   BMI 30.28 kg/m  Physical Exam Vitals and nursing note reviewed.  Constitutional:      General: She is not in acute distress.    Appearance: She is well-developed.  HENT:     Head: Normocephalic and atraumatic.     Nose: Nose normal.     Mouth/Throat:     Mouth: Mucous membranes are moist.     Pharynx: Oropharynx is clear.  Eyes:     Extraocular Movements: Extraocular movements intact.     Conjunctiva/sclera: Conjunctivae normal.     Pupils: Pupils are  equal, round, and reactive to light.  Cardiovascular:     Rate and Rhythm: Normal rate and regular rhythm.     Pulses: Normal pulses.     Heart sounds: Normal heart sounds. No murmur heard. Pulmonary:     Effort: Pulmonary effort is normal. No respiratory distress.     Breath sounds: Normal breath sounds.  Abdominal:     Palpations: Abdomen is soft.     Tenderness: There is no abdominal tenderness.  Musculoskeletal:        General: No swelling.     Cervical back: Normal range of motion and neck supple. No rigidity or tenderness.     Right lower leg: No edema.     Left lower leg: No edema.     Comments: No spinal or chest tenderness  Skin:    General: Skin is warm and dry.     Capillary Refill: Capillary refill takes less than 2 seconds.   Neurological:     General: No focal deficit present.     Mental Status: She is alert and oriented to person, place, and time. Mental status is at baseline.  Psychiatric:        Mood and Affect: Mood normal.     ED Results / Procedures / Treatments   Labs (all labs ordered are listed, but only abnormal results are displayed) Labs Reviewed  BASIC METABOLIC PANEL - Abnormal; Notable for the following components:      Result Value   Calcium 8.5 (*)    All other components within normal limits  CBC  PREGNANCY, URINE  TROPONIN I (HIGH SENSITIVITY)  TROPONIN I (HIGH SENSITIVITY)    EKG None  Radiology DG Chest 2 View  Result Date: 11/16/2022 CLINICAL DATA:  Worsening chest tightness and upper back tension since yesterday. Chest pain EXAM: CHEST - 2 VIEW COMPARISON:  Radiograph and CT 12/07/2012 FINDINGS: Normal cardiomediastinal silhouette. No focal consolidation, pleural effusion, or pneumothorax. No displaced rib fractures. IMPRESSION: No acute cardiopulmonary disease. Electronically Signed   By: Minerva Fester M.D.   On: 11/16/2022 20:34    Procedures Procedures  {Document cardiac monitor, telemetry assessment procedure when appropriate:1}  Medications Ordered in ED Medications - No data to display  ED Course/ Medical Decision Making/ A&P   {   Click here for ABCD2, HEART and other calculatorsREFRESH Note before signing :1}                              Medical Decision Making Amount and/or Complexity of Data Reviewed Labs: ordered. Radiology: ordered.  Risk OTC drugs. Prescription drug management.   Medical Decision Making:   [place] Kelena Tiburcio is a 42 y.o. female who presented to the ED today with chest pain.  Vital signs reviewed.  EKG without signs of acute ischemia.  She is well-appearing, although does report pain in her chest is radiating to her back.  Describes ringing like sensation.  She is good pulses in both extremities, however will obtain CTA to  rule out dissection.  Lower suspicion for PE given no tachycardia, shortness of breath, DVT symptoms.  Initial opponent is undetectable, will repeat but lower suspicion for ACS.  HEAR 1.   {crccomplexity:27900} Reviewed and confirmed nursing documentation for past medical history, family history, social history.  Reassessment and Plan:   ***    Patient's presentation is most consistent with {EM COPA:27473}     {Document critical care time when appropriate:1} {Document  review of labs and clinical decision tools ie heart score, Chads2Vasc2 etc:1}  {Document your independent review of radiology images, and any outside records:1} {Document your discussion with family members, caretakers, and with consultants:1} {Document social determinants of health affecting pt's care:1} {Document your decision making why or why not admission, treatments were needed:1} Final Clinical Impression(s) / ED Diagnoses Final diagnoses:  None    Rx / DC Orders ED Discharge Orders     None

## 2022-11-16 NOTE — ED Notes (Signed)
Patient transported to CT 

## 2022-11-29 ENCOUNTER — Other Ambulatory Visit: Payer: Self-pay | Admitting: Family Medicine

## 2022-11-29 DIAGNOSIS — E66811 Obesity, class 1: Secondary | ICD-10-CM

## 2022-11-29 DIAGNOSIS — E118 Type 2 diabetes mellitus with unspecified complications: Secondary | ICD-10-CM

## 2022-12-26 ENCOUNTER — Other Ambulatory Visit: Payer: Self-pay | Admitting: Family Medicine

## 2022-12-26 DIAGNOSIS — F411 Generalized anxiety disorder: Secondary | ICD-10-CM

## 2023-01-02 ENCOUNTER — Ambulatory Visit: Payer: Managed Care, Other (non HMO) | Admitting: Family Medicine

## 2023-01-09 ENCOUNTER — Ambulatory Visit: Payer: Managed Care, Other (non HMO) | Admitting: Family Medicine

## 2023-01-19 ENCOUNTER — Encounter: Payer: Self-pay | Admitting: Oncology

## 2023-02-06 ENCOUNTER — Ambulatory Visit: Payer: Managed Care, Other (non HMO) | Admitting: Family Medicine

## 2023-03-06 ENCOUNTER — Other Ambulatory Visit: Payer: Self-pay | Admitting: Family Medicine

## 2023-03-06 DIAGNOSIS — E118 Type 2 diabetes mellitus with unspecified complications: Secondary | ICD-10-CM

## 2023-03-06 DIAGNOSIS — E66811 Obesity, class 1: Secondary | ICD-10-CM

## 2023-04-14 ENCOUNTER — Other Ambulatory Visit: Payer: Managed Care, Other (non HMO)

## 2023-04-14 ENCOUNTER — Ambulatory Visit: Payer: Managed Care, Other (non HMO) | Admitting: Oncology

## 2023-04-16 ENCOUNTER — Inpatient Hospital Stay: Payer: Managed Care, Other (non HMO) | Admitting: Oncology

## 2023-04-16 ENCOUNTER — Inpatient Hospital Stay: Payer: Managed Care, Other (non HMO)

## 2023-04-24 ENCOUNTER — Other Ambulatory Visit: Payer: Self-pay

## 2023-04-24 DIAGNOSIS — D509 Iron deficiency anemia, unspecified: Secondary | ICD-10-CM

## 2023-04-26 ENCOUNTER — Other Ambulatory Visit: Payer: Self-pay | Admitting: Oncology

## 2023-04-26 DIAGNOSIS — D509 Iron deficiency anemia, unspecified: Secondary | ICD-10-CM

## 2023-04-26 NOTE — Progress Notes (Unsigned)
 [facility]  [address] [place],  [place]  [address] [phone]  Clinic Day:  10/15/2022  Referring physician: Deeann Saint, MD   HISTORY OF PRESENT ILLNESS:  The patient is a 43 y.o. female  with iron deficiency anemia secondary to her previous gastric bypass surgery.  She comes in today to reassess her labs. Overall, she claims to feel better, but still has a marginal degree of fatigue.  Of note, she still has menstrual cycles, but they are not particularly heavy.  She denies having other overt forms of blood loss.    PHYSICAL EXAM:  There were no vitals taken for this visit. Wt Readings from Last 3 Encounters:  11/16/22 208 lb (94.3 kg)  10/15/22 214 lb 6.4 oz (97.3 kg)  06/23/22 224 lb 3.2 oz (101.7 kg)   There is no height or weight on file to calculate BMI. Performance status (ECOG): 0 - Asymptomatic Physical Exam Constitutional:      Appearance: Normal appearance. She is not ill-appearing.  HENT:     Mouth/Throat:     Mouth: Mucous membranes are moist.     Pharynx: Oropharynx is clear. No oropharyngeal exudate or posterior oropharyngeal erythema.  Cardiovascular:     Rate and Rhythm: Normal rate and regular rhythm.     Heart sounds: No murmur heard.    No friction rub. No gallop.  Pulmonary:     Effort: Pulmonary effort is normal. No respiratory distress.     Breath sounds: Normal breath sounds. No wheezing, rhonchi or rales.  Abdominal:     General: Bowel sounds are normal. There is no distension.     Palpations: Abdomen is soft. There is no mass.     Tenderness: There is no abdominal tenderness.  Musculoskeletal:        General: No swelling.     Right lower leg: No edema.     Left lower leg: No edema.  Lymphadenopathy:     Cervical: No cervical adenopathy.     Upper Body:     Right upper body: No supraclavicular or axillary adenopathy.     Left upper body: No supraclavicular or axillary adenopathy.     Lower Body: No  right inguinal adenopathy. No left inguinal adenopathy.  Skin:    General: Skin is warm.     Coloration: Skin is not jaundiced.     Findings: No lesion or rash.  Neurological:     General: No focal deficit present.     Mental Status: She is alert and oriented to person, place, and time. Mental status is at baseline.  Psychiatric:        Mood and Affect: Mood normal.        Behavior: Behavior normal.        Thought Content: Thought content normal.    LABS:      Latest Ref Rng & Units 11/16/2022    7:07 PM 10/15/2022   12:00 AM 04/14/2022   12:00 AM  CBC  WBC 4.0 - 10.5 K/uL 7.1  5.3     6.5      Hemoglobin 12.0 - 15.0 g/dL 21.3  08.6     57.8      Hematocrit 36.0 - 46.0 % 41.5  40     41      Platelets 150 - 400 K/uL 251  253     308         This result is from an external source.  Latest Reference Range & Units 10/15/22 08:42  Iron 28 - 170 ug/dL 68  UIBC ug/dL 161  TIBC 096 - 045 ug/dL 409  Saturation Ratios 10.4 - 31.8 % 16  Ferritin 11 - 307 ng/mL 15  Folate >5.9 ng/mL 6.8  Vitamin B12 180 - 914 pg/mL 337   ASSESSMENT & PLAN:  A 43 y.o. female with iron deficiency anemia secondary to previous gastric bypass surgery.  I am pleased as her hemoglobin remains fine at 13.3.  Her iron studies today remain fine, as does her vitamin B12 level.  Clinically, the patient appears to be doing well.  I will see her back in another 6 months for repeat clinical assessment.  The patient understands all the plans discussed today and is in agreement with them.  Bernard Donahoo Kirby Funk, MD

## 2023-04-27 ENCOUNTER — Inpatient Hospital Stay: Admitting: Oncology

## 2023-04-27 ENCOUNTER — Other Ambulatory Visit: Payer: Self-pay | Admitting: Oncology

## 2023-04-27 ENCOUNTER — Inpatient Hospital Stay: Attending: Oncology

## 2023-04-27 VITALS — BP 118/80 | HR 58 | Temp 98.2°F | Resp 14 | Ht 69.5 in | Wt 207.9 lb

## 2023-04-27 DIAGNOSIS — D509 Iron deficiency anemia, unspecified: Secondary | ICD-10-CM

## 2023-04-27 DIAGNOSIS — Z9884 Bariatric surgery status: Secondary | ICD-10-CM | POA: Insufficient documentation

## 2023-04-27 DIAGNOSIS — D508 Other iron deficiency anemias: Secondary | ICD-10-CM

## 2023-04-27 LAB — IRON AND TIBC
Iron: 96 ug/dL (ref 28–170)
Saturation Ratios: 18 % (ref 10.4–31.8)
TIBC: 525 ug/dL — ABNORMAL HIGH (ref 250–450)
UIBC: 429 ug/dL

## 2023-04-27 LAB — CBC WITH DIFFERENTIAL (CANCER CENTER ONLY)
Abs Immature Granulocytes: 0.01 10*3/uL (ref 0.00–0.07)
Basophils Absolute: 0 10*3/uL (ref 0.0–0.1)
Basophils Relative: 0 %
Eosinophils Absolute: 0.2 10*3/uL (ref 0.0–0.5)
Eosinophils Relative: 4 %
HCT: 38.8 % (ref 36.0–46.0)
Hemoglobin: 12.3 g/dL (ref 12.0–15.0)
Immature Granulocytes: 0 %
Lymphocytes Relative: 34 %
Lymphs Abs: 1.8 10*3/uL (ref 0.7–4.0)
MCH: 27.3 pg (ref 26.0–34.0)
MCHC: 31.7 g/dL (ref 30.0–36.0)
MCV: 86 fL (ref 80.0–100.0)
Monocytes Absolute: 0.7 10*3/uL (ref 0.1–1.0)
Monocytes Relative: 14 %
Neutro Abs: 2.5 10*3/uL (ref 1.7–7.7)
Neutrophils Relative %: 48 %
Platelet Count: 256 10*3/uL (ref 150–400)
RBC: 4.51 MIL/uL (ref 3.87–5.11)
RDW: 12.8 % (ref 11.5–15.5)
WBC Count: 5.2 10*3/uL (ref 4.0–10.5)
nRBC: 0 % (ref 0.0–0.2)
nRBC: 0 /100{WBCs}

## 2023-04-27 LAB — FOLATE: Folate: 8.4 ng/mL (ref >5.9)

## 2023-04-27 LAB — FERRITIN: Ferritin: 8 ng/mL — ABNORMAL LOW (ref 11–307)

## 2023-04-27 LAB — VITAMIN B12: Vitamin B-12: 379 pg/mL (ref 180–914)

## 2023-04-28 ENCOUNTER — Telehealth: Payer: Self-pay

## 2023-04-28 ENCOUNTER — Encounter: Payer: Self-pay | Admitting: Oncology

## 2023-04-28 NOTE — Telephone Encounter (Signed)
 Dr Melvyn Neth: [name] [name], please let pt know she is iron deficient again. Schedulers - set her up for IV iron asap. thx

## 2023-05-01 ENCOUNTER — Encounter: Payer: Self-pay | Admitting: Oncology

## 2023-05-06 ENCOUNTER — Telehealth: Payer: Self-pay | Admitting: Oncology

## 2023-05-06 NOTE — Telephone Encounter (Signed)
 Patient has been scheduled for follow-up visit per 05/05/23 LOS.  Pt aware of scheduled appt details.

## 2023-05-08 ENCOUNTER — Encounter: Payer: Self-pay | Admitting: Oncology

## 2023-05-08 ENCOUNTER — Inpatient Hospital Stay

## 2023-05-08 VITALS — BP 107/77 | HR 84 | Temp 98.0°F | Resp 18

## 2023-05-08 DIAGNOSIS — D508 Other iron deficiency anemias: Secondary | ICD-10-CM | POA: Diagnosis not present

## 2023-05-08 DIAGNOSIS — D509 Iron deficiency anemia, unspecified: Secondary | ICD-10-CM

## 2023-05-08 DIAGNOSIS — Z9884 Bariatric surgery status: Secondary | ICD-10-CM

## 2023-05-08 MED ORDER — IRON SUCROSE 20 MG/ML IV SOLN
200.0000 mg | Freq: Once | INTRAVENOUS | Status: AC
  Administered 2023-05-08: 200 mg via INTRAVENOUS
  Filled 2023-05-08: qty 10

## 2023-05-08 MED ORDER — CYANOCOBALAMIN 1000 MCG/ML IJ SOLN
1000.0000 ug | Freq: Once | INTRAMUSCULAR | Status: AC
  Administered 2023-05-08: 1000 ug via INTRAMUSCULAR
  Filled 2023-05-08: qty 1

## 2023-05-08 MED ORDER — SODIUM CHLORIDE 0.9 % IV SOLN: INTRAVENOUS | Status: DC

## 2023-05-08 NOTE — Patient Instructions (Signed)

## 2023-05-11 ENCOUNTER — Inpatient Hospital Stay

## 2023-05-11 VITALS — BP 111/67 | HR 88 | Temp 97.5°F | Resp 20

## 2023-05-11 DIAGNOSIS — D509 Iron deficiency anemia, unspecified: Secondary | ICD-10-CM

## 2023-05-11 DIAGNOSIS — Z9884 Bariatric surgery status: Secondary | ICD-10-CM

## 2023-05-11 DIAGNOSIS — D508 Other iron deficiency anemias: Secondary | ICD-10-CM | POA: Diagnosis not present

## 2023-05-11 MED ORDER — SODIUM CHLORIDE 0.9% FLUSH
10.0000 mL | Freq: Once | INTRAVENOUS | Status: DC | PRN
Start: 2023-05-11 — End: 2023-05-11

## 2023-05-11 MED ORDER — IRON SUCROSE 20 MG/ML IV SOLN
200.0000 mg | Freq: Once | INTRAVENOUS | Status: AC
  Administered 2023-05-11: 200 mg via INTRAVENOUS
  Filled 2023-05-11: qty 10

## 2023-05-11 NOTE — Patient Instructions (Signed)

## 2023-05-13 ENCOUNTER — Inpatient Hospital Stay

## 2023-05-13 VITALS — BP 102/80 | HR 84 | Temp 98.3°F | Resp 18

## 2023-05-13 DIAGNOSIS — Z9884 Bariatric surgery status: Secondary | ICD-10-CM

## 2023-05-13 DIAGNOSIS — D509 Iron deficiency anemia, unspecified: Secondary | ICD-10-CM

## 2023-05-13 DIAGNOSIS — D508 Other iron deficiency anemias: Secondary | ICD-10-CM | POA: Diagnosis not present

## 2023-05-13 MED ORDER — IRON SUCROSE 20 MG/ML IV SOLN
200.0000 mg | Freq: Once | INTRAVENOUS | Status: AC
  Administered 2023-05-13: 200 mg via INTRAVENOUS
  Filled 2023-05-13: qty 10

## 2023-05-13 NOTE — Patient Instructions (Signed)

## 2023-05-14 ENCOUNTER — Inpatient Hospital Stay

## 2023-05-14 ENCOUNTER — Encounter: Payer: Self-pay | Admitting: Oncology

## 2023-05-14 VITALS — BP 100/77 | HR 92 | Temp 97.9°F | Resp 18

## 2023-05-14 DIAGNOSIS — D509 Iron deficiency anemia, unspecified: Secondary | ICD-10-CM

## 2023-05-14 DIAGNOSIS — Z9884 Bariatric surgery status: Secondary | ICD-10-CM

## 2023-05-14 DIAGNOSIS — D508 Other iron deficiency anemias: Secondary | ICD-10-CM | POA: Diagnosis not present

## 2023-05-14 MED ORDER — SODIUM CHLORIDE 0.9% FLUSH
10.0000 mL | Freq: Once | INTRAVENOUS | Status: AC | PRN
  Administered 2023-05-14: 10 mL

## 2023-05-14 MED ORDER — IRON SUCROSE 20 MG/ML IV SOLN
200.0000 mg | Freq: Once | INTRAVENOUS | Status: AC
  Administered 2023-05-14: 200 mg via INTRAVENOUS
  Filled 2023-05-14: qty 10

## 2023-05-14 MED ORDER — SODIUM CHLORIDE 0.9 % IV SOLN: INTRAVENOUS | Status: DC

## 2023-05-14 NOTE — Patient Instructions (Signed)

## 2023-05-15 ENCOUNTER — Inpatient Hospital Stay

## 2023-05-15 VITALS — BP 111/57 | HR 88 | Temp 98.0°F | Resp 18

## 2023-05-15 DIAGNOSIS — D509 Iron deficiency anemia, unspecified: Secondary | ICD-10-CM

## 2023-05-15 DIAGNOSIS — Z9884 Bariatric surgery status: Secondary | ICD-10-CM

## 2023-05-15 DIAGNOSIS — D508 Other iron deficiency anemias: Secondary | ICD-10-CM | POA: Diagnosis not present

## 2023-05-15 MED ORDER — SODIUM CHLORIDE 0.9 % IV SOLN: INTRAVENOUS | Status: DC

## 2023-05-15 MED ORDER — IRON SUCROSE 20 MG/ML IV SOLN
200.0000 mg | Freq: Once | INTRAVENOUS | Status: AC
  Administered 2023-05-15: 200 mg via INTRAVENOUS
  Filled 2023-05-15: qty 10

## 2023-05-15 MED ORDER — SODIUM CHLORIDE 0.9% FLUSH
10.0000 mL | Freq: Once | INTRAVENOUS | Status: AC | PRN
  Administered 2023-05-15: 10 mL

## 2023-05-15 NOTE — Patient Instructions (Signed)

## 2023-06-29 LAB — HM MAMMOGRAPHY

## 2023-06-30 ENCOUNTER — Other Ambulatory Visit: Payer: Self-pay | Admitting: Family Medicine

## 2023-06-30 DIAGNOSIS — E538 Deficiency of other specified B group vitamins: Secondary | ICD-10-CM

## 2023-08-27 ENCOUNTER — Inpatient Hospital Stay

## 2023-08-27 ENCOUNTER — Inpatient Hospital Stay: Admitting: Hematology and Oncology

## 2023-08-28 ENCOUNTER — Inpatient Hospital Stay: Attending: Hematology and Oncology

## 2023-08-28 ENCOUNTER — Telehealth: Payer: Self-pay | Admitting: Oncology

## 2023-08-28 ENCOUNTER — Inpatient Hospital Stay: Admitting: Hematology and Oncology

## 2023-08-28 ENCOUNTER — Encounter: Payer: Self-pay | Admitting: Hematology and Oncology

## 2023-08-28 VITALS — BP 107/82 | HR 109 | Temp 98.4°F | Resp 18 | Ht 69.5 in | Wt 210.0 lb

## 2023-08-28 DIAGNOSIS — K912 Postsurgical malabsorption, not elsewhere classified: Secondary | ICD-10-CM | POA: Insufficient documentation

## 2023-08-28 DIAGNOSIS — D509 Iron deficiency anemia, unspecified: Secondary | ICD-10-CM | POA: Insufficient documentation

## 2023-08-28 DIAGNOSIS — Z9884 Bariatric surgery status: Secondary | ICD-10-CM | POA: Insufficient documentation

## 2023-08-28 DIAGNOSIS — D508 Other iron deficiency anemias: Secondary | ICD-10-CM | POA: Diagnosis not present

## 2023-08-28 LAB — FOLATE: Folate: 8.8 ng/mL (ref >5.9)

## 2023-08-28 LAB — IRON AND TIBC
Iron: 83 ug/dL (ref 28–170)
Saturation Ratios: 22 % (ref 10.4–31.8)
TIBC: 385 ug/dL (ref 250–450)
UIBC: 302 ug/dL

## 2023-08-28 LAB — CBC WITH DIFFERENTIAL (CANCER CENTER ONLY)
Abs Immature Granulocytes: 0.01 K/uL (ref 0.00–0.07)
Basophils Absolute: 0 K/uL (ref 0.0–0.1)
Basophils Relative: 0 %
Eosinophils Absolute: 0.2 K/uL (ref 0.0–0.5)
Eosinophils Relative: 3 %
HCT: 40.4 % (ref 36.0–46.0)
Hemoglobin: 13.3 g/dL (ref 12.0–15.0)
Immature Granulocytes: 0 %
Lymphocytes Relative: 33 %
Lymphs Abs: 1.5 K/uL (ref 0.7–4.0)
MCH: 28.3 pg (ref 26.0–34.0)
MCHC: 32.9 g/dL (ref 30.0–36.0)
MCV: 86 fL (ref 80.0–100.0)
Monocytes Absolute: 0.5 K/uL (ref 0.1–1.0)
Monocytes Relative: 11 %
Neutro Abs: 2.4 K/uL (ref 1.7–7.7)
Neutrophils Relative %: 53 %
Platelet Count: 275 K/uL (ref 150–400)
RBC: 4.7 MIL/uL (ref 3.87–5.11)
RDW: 12.2 % (ref 11.5–15.5)
WBC Count: 4.6 K/uL (ref 4.0–10.5)
nRBC: 0 % (ref 0.0–0.2)

## 2023-08-28 LAB — VITAMIN B12: Vitamin B-12: 343 pg/mL (ref 180–914)

## 2023-08-28 LAB — FERRITIN: Ferritin: 99 ng/mL (ref 11–307)

## 2023-08-28 NOTE — Progress Notes (Cosign Needed)
 [facility]  [address] [place],  [place]  [address] [phone]  Clinic Day:  08/28/2023  Referring physician: Mercer Clotilda SAUNDERS, MD   HISTORY OF PRESENT ILLNESS:  The patient is a 43 y.o. female with iron  deficiency anemia secondary to her previous gastric bypass surgery.  She intermittently receives IV iron  replacement.  She had Venofer  again in April.  She comes in today to reassess her labs.  She continues to report fatigue.  She states her menses are fairly regular every 3 to 4 weeks lasting for about 5 days, but does report 2 heavy days.  She denies having other overt forms of blood loss.  She states she does not take a multivitamin.  VITALS:   Blood pressure 107/82, pulse (!) 109, temperature 98.4 F (36.9 C), temperature source Oral, resp. rate 18, height 5' 9.5 (1.765 m), weight 210 lb (95.3 kg), last menstrual period 08/09/2023, SpO2 100%. Wt Readings from Last 3 Encounters:  08/28/23 210 lb (95.3 kg)  04/27/23 207 lb 14.4 oz (94.3 kg)  11/16/22 208 lb (94.3 kg)   Body mass index is 30.57 kg/m.  Performance status (ECOG): 1 - Symptomatic but completely ambulatory  PHYSICAL EXAM:   Physical Exam Vitals and nursing note reviewed.  Constitutional:      General: She is not in acute distress.    Appearance: Normal appearance. She is not ill-appearing.  HENT:     Head: Normocephalic and atraumatic.     Mouth/Throat:     Mouth: Mucous membranes are moist.     Pharynx: Oropharynx is clear. No oropharyngeal exudate or posterior oropharyngeal erythema.  Eyes:     General: No scleral icterus.    Extraocular Movements: Extraocular movements intact.     Conjunctiva/sclera: Conjunctivae normal.     Pupils: Pupils are equal, round, and reactive to light.  Cardiovascular:     Rate and Rhythm: Normal rate and regular rhythm.     Heart sounds: Normal heart sounds. No murmur heard.    No friction rub. No gallop.  Pulmonary:     Effort: Pulmonary effort  is normal.     Breath sounds: Normal breath sounds. No wheezing, rhonchi or rales.  Abdominal:     General: There is no distension.     Palpations: Abdomen is soft. There is no hepatomegaly, splenomegaly or mass.     Tenderness: There is no abdominal tenderness.  Musculoskeletal:        General: Normal range of motion.     Cervical back: Normal range of motion and neck supple. No tenderness.     Right lower leg: No edema.     Left lower leg: No edema.  Lymphadenopathy:     Cervical: No cervical adenopathy.     Upper Body:     Right upper body: No supraclavicular or axillary adenopathy.     Left upper body: No supraclavicular or axillary adenopathy.     Lower Body: No right inguinal adenopathy. No left inguinal adenopathy.  Skin:    General: Skin is warm and dry.     Coloration: Skin is not jaundiced.     Findings: No rash.  Neurological:     Mental Status: She is alert and oriented to person, place, and time.     Cranial Nerves: No cranial nerve deficit.  Psychiatric:        Mood and Affect: Mood normal.        Behavior: Behavior normal.  Thought Content: Thought content normal.      LABS:      Latest Ref Rng & Units 08/28/2023    9:06 AM 04/27/2023    1:56 PM 11/16/2022    7:07 PM  CBC  WBC 4.0 - 10.5 K/uL 4.6  5.2  7.1   Hemoglobin 12.0 - 15.0 g/dL 86.6  87.6  86.5   Hematocrit 36.0 - 46.0 % 40.4  38.8  41.5   Platelets 150 - 400 K/uL 275  256  251       Latest Ref Rng & Units 11/16/2022    7:07 PM 09/25/2021   11:56 AM 08/31/2020    3:31 PM  CMP  Glucose 70 - 99 mg/dL 99  896  893   BUN 6 - 20 mg/dL 8  8  9    Creatinine 0.44 - 1.00 mg/dL 9.25  9.23  9.13   Sodium 135 - 145 mmol/L 135  138  136   Potassium 3.5 - 5.1 mmol/L 3.6  4.0  4.4   Chloride 98 - 111 mmol/L 102  104  102   CO2 22 - 32 mmol/L 23  28  28    Calcium 8.9 - 10.3 mg/dL 8.5  9.1  9.1   Total Protein 6.0 - 8.3 g/dL  7.3  7.3   Total Bilirubin 0.2 - 1.2 mg/dL  0.4  0.2   Alkaline Phos 39 -  117 U/L  80    AST 0 - 37 U/L  29  24   ALT 0 - 35 U/L  38  19      Lab Results  Component Value Date   TIBC 385 08/28/2023   TIBC 525 (H) 04/27/2023   TIBC 438 10/15/2022   FERRITIN 99 08/28/2023   FERRITIN 8 (L) 04/27/2023   FERRITIN 15 10/15/2022   IRONPCTSAT 22 08/28/2023   IRONPCTSAT 18 04/27/2023   IRONPCTSAT 16 10/15/2022    Review Flowsheet  More data exists      Latest Ref Rng & Units 10/15/2022 04/27/2023 08/28/2023  Oncology Labs  Ferritin 11 - 307 ng/mL 15  8  99   %SAT 10.4 - 31.8 % 16  18  22       STUDIES:   No results found.    ASSESSMENT & PLAN:   Assessment/Plan:  43 y.o. female with iron  deficiency anemia due to decreased absorption after gastric bypass surgery.  Her iron  stores are adequate today.  I recommended she try a multivitamin with iron .  I will plan to see her back in 4 months for repeat clinical assessment.  I have encouraged her to follow-up with her primary care provider regarding other vitamin deficiencies.  The patient understands all the plans discussed today and is in agreement with them.  She knows to contact our office if she develops concerns prior to her next appointment.     Andrez DELENA Foy, PA-C   Physician Assistant [facility] [phone]

## 2023-08-28 NOTE — Telephone Encounter (Signed)
 Patient has been scheduled for follow-up visit per 08/28/23 LOS.  Pt given an appt calendar with date and time.

## 2023-09-01 ENCOUNTER — Encounter: Payer: Self-pay | Admitting: Oncology

## 2023-09-02 ENCOUNTER — Other Ambulatory Visit: Payer: Self-pay | Admitting: Family Medicine

## 2023-09-02 DIAGNOSIS — E118 Type 2 diabetes mellitus with unspecified complications: Secondary | ICD-10-CM

## 2023-09-02 DIAGNOSIS — E66811 Obesity, class 1: Secondary | ICD-10-CM

## 2023-09-04 ENCOUNTER — Encounter: Admitting: Family Medicine

## 2023-09-14 ENCOUNTER — Encounter: Payer: Self-pay | Admitting: Family Medicine

## 2023-09-14 ENCOUNTER — Ambulatory Visit (INDEPENDENT_AMBULATORY_CARE_PROVIDER_SITE_OTHER): Admitting: Family Medicine

## 2023-09-14 ENCOUNTER — Encounter: Admitting: Family Medicine

## 2023-09-14 ENCOUNTER — Telehealth: Payer: Self-pay

## 2023-09-14 VITALS — BP 134/82 | HR 96 | Temp 98.4°F | Ht 69.5 in | Wt 207.0 lb

## 2023-09-14 DIAGNOSIS — Z9884 Bariatric surgery status: Secondary | ICD-10-CM

## 2023-09-14 DIAGNOSIS — E559 Vitamin D deficiency, unspecified: Secondary | ICD-10-CM | POA: Diagnosis not present

## 2023-09-14 DIAGNOSIS — E538 Deficiency of other specified B group vitamins: Secondary | ICD-10-CM

## 2023-09-14 DIAGNOSIS — Z7985 Long-term (current) use of injectable non-insulin antidiabetic drugs: Secondary | ICD-10-CM | POA: Diagnosis not present

## 2023-09-14 DIAGNOSIS — F411 Generalized anxiety disorder: Secondary | ICD-10-CM | POA: Diagnosis not present

## 2023-09-14 DIAGNOSIS — Z0000 Encounter for general adult medical examination without abnormal findings: Secondary | ICD-10-CM | POA: Diagnosis not present

## 2023-09-14 DIAGNOSIS — Z6832 Body mass index (BMI) 32.0-32.9, adult: Secondary | ICD-10-CM | POA: Diagnosis not present

## 2023-09-14 DIAGNOSIS — E118 Type 2 diabetes mellitus with unspecified complications: Secondary | ICD-10-CM

## 2023-09-14 DIAGNOSIS — E66811 Obesity, class 1: Secondary | ICD-10-CM

## 2023-09-14 LAB — CBC WITH DIFFERENTIAL/PLATELET
Basophils Absolute: 0 K/uL (ref 0.0–0.1)
Basophils Relative: 0.6 % (ref 0.0–3.0)
Eosinophils Absolute: 0.2 K/uL (ref 0.0–0.7)
Eosinophils Relative: 4 % (ref 0.0–5.0)
HCT: 40.8 % (ref 36.0–46.0)
Hemoglobin: 13.4 g/dL (ref 12.0–15.0)
Lymphocytes Relative: 36.9 % (ref 12.0–46.0)
Lymphs Abs: 1.6 K/uL (ref 0.7–4.0)
MCHC: 32.7 g/dL (ref 30.0–36.0)
MCV: 85.9 fl (ref 78.0–100.0)
Monocytes Absolute: 0.4 K/uL (ref 0.1–1.0)
Monocytes Relative: 10.4 % (ref 3.0–12.0)
Neutro Abs: 2 K/uL (ref 1.4–7.7)
Neutrophils Relative %: 48.1 % (ref 43.0–77.0)
Platelets: 272 K/uL (ref 150.0–400.0)
RBC: 4.75 Mil/uL (ref 3.87–5.11)
RDW: 12.8 % (ref 11.5–15.5)
WBC: 4.2 K/uL (ref 4.0–10.5)

## 2023-09-14 LAB — COMPREHENSIVE METABOLIC PANEL WITH GFR
ALT: 52 U/L — ABNORMAL HIGH (ref 0–35)
AST: 37 U/L (ref 0–37)
Albumin: 3.9 g/dL (ref 3.5–5.2)
Alkaline Phosphatase: 79 U/L (ref 39–117)
BUN: 7 mg/dL (ref 6–23)
CO2: 29 meq/L (ref 19–32)
Calcium: 8.7 mg/dL (ref 8.4–10.5)
Chloride: 100 meq/L (ref 96–112)
Creatinine, Ser: 0.81 mg/dL (ref 0.40–1.20)
GFR: 88.58 mL/min
Glucose, Bld: 92 mg/dL (ref 70–99)
Potassium: 3.9 meq/L (ref 3.5–5.1)
Sodium: 135 meq/L (ref 135–145)
Total Bilirubin: 0.5 mg/dL (ref 0.2–1.2)
Total Protein: 7.2 g/dL (ref 6.0–8.3)

## 2023-09-14 LAB — MICROALBUMIN / CREATININE URINE RATIO
Creatinine,U: 197.2 mg/dL
Microalb Creat Ratio: 4.6 mg/g (ref 0.0–30.0)
Microalb, Ur: 0.9 mg/dL (ref 0.0–1.9)

## 2023-09-14 LAB — LIPID PANEL
Cholesterol: 163 mg/dL (ref 0–200)
HDL: 86.5 mg/dL (ref >39.00)
LDL Cholesterol: 67 mg/dL (ref 0–99)
NonHDL: 76.99
Total CHOL/HDL Ratio: 2
Triglycerides: 50 mg/dL (ref 0.0–149.0)
VLDL: 10 mg/dL (ref 0.0–40.0)

## 2023-09-14 LAB — TSH: TSH: 3.39 u[IU]/mL (ref 0.35–5.50)

## 2023-09-14 LAB — VITAMIN B12: Vitamin B-12: 350 pg/mL (ref 211–911)

## 2023-09-14 LAB — VITAMIN D 25 HYDROXY (VIT D DEFICIENCY, FRACTURES): VITD: 10.04 ng/mL — ABNORMAL LOW (ref 30.00–100.00)

## 2023-09-14 LAB — HEMOGLOBIN A1C: Hgb A1c MFr Bld: 6.5 % (ref 4.6–6.5)

## 2023-09-14 LAB — T4, FREE: Free T4: 0.83 ng/dL (ref 0.60–1.60)

## 2023-09-14 MED ORDER — SEMAGLUTIDE (1 MG/DOSE) 4 MG/3ML ~~LOC~~ SOPN: 1.0000 mg | PEN_INJECTOR | SUBCUTANEOUS | 11 refills | Status: AC

## 2023-09-14 NOTE — Telephone Encounter (Signed)
 Called patient about getting her labs done

## 2023-09-14 NOTE — Progress Notes (Unsigned)
 Established Patient Office Visit   Subjective  Patient ID: [name] [name], female    DOB: [birth year]  Age: 43 y.o. MRN: [id number]  Chief Complaint  Patient presents with   Annual Exam    Patient is feeling fatigue, questions about Ozempic  Dose     Pt is a 43 yo female with pmh sig for h/o roux en y, anemia, gallstone pancreatitis and hepatitis, depression, allergies, vitamin b12 and D deficiencies who was seen for CPE and f/u.  Pt doing well.  Still dealing with painful heavy menses, but iron  was normal at recent heme/onc visit.  Had IUD but it as removed as not properly in position.  Pt has not had success with OCPs in the past.    Taking ozempic  2 mg wkly for DM and wt.  Has noticed bs dropping to ~90 1 hr after eating.  Becomes symptomatic and has to eat something.    Patient Active Problem List   Diagnosis Date Noted   Gallstone pancreatitis 02/20/2020   Hepatitis 02/20/2020   Depression, recurrent (HCC) 04/04/2019   Seasonal allergies 04/04/2019   Vitamin B12 deficiency 07/21/2017   Iron  deficiency 07/21/2017   Vitamin D  deficiency 07/21/2017   Iron  deficiency anemia 06/29/2017   Gastric bypass status for obesity 02/23/2013   Past Medical History:  Diagnosis Date   Anxiety    Depression    Diabetes mellitus 05/2008   DR. BALAN   GERD (gastroesophageal reflux disease)    after taking antibiotics it went away   Hypertension    Obesity    Past Surgical History:  Procedure Laterality Date   BREATH TEK H PYLORI N/A 08/20/2012   Procedure: BREATH TEK H PYLORI;  Surgeon: Redell Faith, DO;  Location: [facility];  Service: Endoscopy;  Laterality: N/A;   CHOLECYSTECTOMY N/A 02/22/2020   Procedure: LAPAROSCOPIC CHOLECYSTECTOMY WITH INTRAOPERATIVE CHOLANGIOGRAM;  Surgeon: Gladis Cough, MD;  Location: [facility];  Service: General;  Laterality: N/A;   GASTRIC ROUX-EN-Y N/A 08/24/2012   Procedure: LAPAROSCOPIC ROUX-EN-Y GASTRIC BYPASS WITH UPPER ENDOSCOPY;  Surgeon: Redell Faith, DO;  Location: [facility];  Service: General;  Laterality: N/A;   Social History   Tobacco Use   Smoking status: Never   Smokeless tobacco: Never  Vaping Use   Vaping status: Never Used  Substance Use Topics   Alcohol use: No    Alcohol/week: 0.0 standard drinks of alcohol   Drug use: No   Family History  Problem Relation Age of Onset   Diabetes Mother    Hypertension Mother    Miscarriages / Leoni Mother    Mental illness Brother    Diabetes Maternal Aunt    Dementia Maternal Grandmother    Diabetes Maternal Grandmother    Diabetes Maternal Grandfather    Allergies  Allergen Reactions   Metformin And Related Itching    ROS Negative unless stated above    Objective:     BP 134/82 (BP Location: Left Arm, Patient Position: Sitting, Cuff Size: Normal)   Pulse 96   Temp 98.4 F (36.9 C) (Oral)   Ht 5' 9.5 (1.765 m)   Wt 207 lb (93.9 kg)   LMP 08/09/2023 (Approximate)   SpO2 99%   BMI 30.13 kg/m  BP Readings from Last 3 Encounters:  09/14/23 134/82  08/28/23 107/82  05/15/23 (!) 111/57   Wt Readings from Last 3 Encounters:  09/14/23 207 lb (93.9 kg)  08/28/23 210 lb (95.3 kg)  04/27/23 207 lb 14.4 oz (94.3 kg)  Physical Exam Constitutional:      Appearance: Normal appearance.  HENT:     Head: Normocephalic and atraumatic.     Right Ear: Tympanic membrane, ear canal and external ear normal.     Left Ear: Tympanic membrane, ear canal and external ear normal.     Nose: Nose normal.     Mouth/Throat:     Mouth: Mucous membranes are moist.     Pharynx: No oropharyngeal exudate or posterior oropharyngeal erythema.  Eyes:     General: No scleral icterus.    Extraocular Movements: Extraocular movements intact.     Conjunctiva/sclera: Conjunctivae normal.     Pupils: Pupils are equal, round, and reactive to light.  Neck:     Thyroid : No thyromegaly.     Vascular: No carotid bruit.  Cardiovascular:     Rate and Rhythm: Normal rate and  regular rhythm.     Pulses: Normal pulses.     Heart sounds: Normal heart sounds. No murmur heard.    No friction rub.  Pulmonary:     Effort: Pulmonary effort is normal.     Breath sounds: Normal breath sounds. No wheezing, rhonchi or rales.  Abdominal:     General: Bowel sounds are normal.     Palpations: Abdomen is soft.     Tenderness: There is no abdominal tenderness.  Musculoskeletal:        General: No deformity. Normal range of motion.  Lymphadenopathy:     Cervical: No cervical adenopathy.  Skin:    General: Skin is warm and dry.     Findings: No lesion.  Neurological:     General: No focal deficit present.     Mental Status: She is alert and oriented to person, place, and time.  Psychiatric:        Mood and Affect: Mood normal.        Thought Content: Thought content normal.        09/14/2023   11:40 AM 08/28/2023    9:33 AM 06/23/2022   10:17 AM  Depression screen PHQ 2/9  Decreased Interest 0 0 1  Down, Depressed, Hopeless 0 0 2  PHQ - 2 Score 0 0 3  Altered sleeping 1  3  Tired, decreased energy 1  3  Change in appetite 0  2  Feeling bad or failure about yourself  0  0  Trouble concentrating 0  0  Moving slowly or fidgety/restless 0  0  Suicidal thoughts 0  0  PHQ-9 Score 2  11  Difficult doing work/chores Not difficult at all  Somewhat difficult      09/14/2023   11:40 AM 06/23/2022   10:17 AM 08/31/2020    2:13 PM 11/21/2019    4:26 PM  GAD 7 : Generalized Anxiety Score  Nervous, Anxious, on Edge 1 3 2 3   Control/stop worrying 0 1 1 2   Worry too much - different things 0 3 1 2   Trouble relaxing 0 3 1 3   Restless 0 1 0 0  Easily annoyed or irritable 0 3 2 3   Afraid - awful might happen 0 1 0 0  Total GAD 7 Score 1 15 7 13   Anxiety Difficulty Not difficult at all Somewhat difficult Somewhat difficult    Diabetic Foot Exam - Simple   Simple Foot Form Diabetic Foot exam was performed with the following findings: Yes 09/14/2023 11:42 PM  Visual  Inspection No deformities, no ulcerations, no other skin breakdown bilaterally: Yes Sensation Testing  Intact to touch and monofilament testing bilaterally: Yes Pulse Check Posterior Tibialis and Dorsalis pulse intact bilaterally: Yes Comments    No results found for any visits on 09/14/23.    Assessment & Plan:   Well adult exam -     CBC with Differential/Platelet; Future -     Comprehensive metabolic panel with GFR; Future -     Hemoglobin A1c; Future -     Lipid panel; Future -     T4, free; Future -     TSH; Future  Controlled type 2 diabetes mellitus with complication, without long-term current use of insulin  (HCC) -     Microalbumin / creatinine urine ratio -     Hemoglobin A1c; Future -     Lipid panel; Future -     Semaglutide  (1 MG/DOSE); Inject 1 mg as directed once a week.  Dispense: 3 mL; Refill: 11  Vitamin D  deficiency -     VITAMIN D  25 Hydroxy (Vit-D Deficiency, Fractures); Future  Vitamin B12 deficiency -     Vitamin B12; Future  Class 1 obesity with serious comorbidity and body mass index (BMI) of 32.0 to 32.9 in adult, unspecified obesity type -     CBC with Differential/Platelet; Future -     Comprehensive metabolic panel with GFR; Future -     Hemoglobin A1c; Future -     Lipid panel; Future -     T4, free; Future -     TSH; Future -     Vitamin B12; Future -     VITAMIN D  25 Hydroxy (Vit-D Deficiency, Fractures); Future -     Semaglutide  (1 MG/DOSE); Inject 1 mg as directed once a week.  Dispense: 3 mL; Refill: 11  GAD (generalized anxiety disorder) -     T4, free; Future -     TSH; Future  History of Roux-en-Y gastric bypass -     Comprehensive metabolic panel with GFR; Future -     Vitamin B12; Future -     VITAMIN D  25 Hydroxy (Vit-D Deficiency, Fractures); Future  Age appropriate health screenings discussed.  Obtain labs when fasting.  Immunizations reviewed.   Pap done 02/10/20.  Repeat in 5 yrs.  Colonoscopy due at age 47.  Mammogram  done 06/29/23.  Next CPE in 1 yr.  Body mass index is 30.13 kg/m.  Last Hgb A1C 7.0% on 09/25/21.  Continue lifestyle modifications.  Will decrease dose of semaglutide  form 2 mg to 1 mg due to postprandial hypoglycemia.  For continued symptoms refer to Endo given h/o gastric surg.   PHQ 9 score 2, GAD 7 score 1 this visit.  Continue supportive care and counseling.  Return in about 3 months (around 12/15/2023).   [name], MD

## 2023-09-23 ENCOUNTER — Ambulatory Visit: Payer: Self-pay | Admitting: Family Medicine

## 2023-09-23 ENCOUNTER — Telehealth: Admitting: Family Medicine

## 2023-09-23 ENCOUNTER — Encounter: Payer: Self-pay | Admitting: Family Medicine

## 2023-09-23 DIAGNOSIS — R7401 Elevation of levels of liver transaminase levels: Secondary | ICD-10-CM | POA: Diagnosis not present

## 2023-09-23 DIAGNOSIS — Z9884 Bariatric surgery status: Secondary | ICD-10-CM

## 2023-09-23 DIAGNOSIS — E559 Vitamin D deficiency, unspecified: Secondary | ICD-10-CM

## 2023-09-23 DIAGNOSIS — E538 Deficiency of other specified B group vitamins: Secondary | ICD-10-CM

## 2023-09-23 DIAGNOSIS — S46811A Strain of other muscles, fascia and tendons at shoulder and upper arm level, right arm, initial encounter: Secondary | ICD-10-CM

## 2023-09-23 DIAGNOSIS — M6289 Other specified disorders of muscle: Secondary | ICD-10-CM

## 2023-09-23 MED ORDER — CYCLOBENZAPRINE HCL 5 MG PO TABS: 5.0000 mg | ORAL_TABLET | Freq: Three times a day (TID) | ORAL | 0 refills | Status: DC | PRN

## 2023-09-23 MED ORDER — VITAMIN D (ERGOCALCIFEROL) 1.25 MG (50000 UNIT) PO CAPS: 50000.0000 [IU] | ORAL_CAPSULE | ORAL | 0 refills | Status: AC

## 2023-09-23 MED ORDER — CYANOCOBALAMIN 1000 MCG/ML IJ SOLN: 1000.0000 ug | INTRAMUSCULAR | 0 refills | Status: AC

## 2023-09-23 NOTE — Progress Notes (Signed)
 Virtual Visit via Video Note  I connected with [name] [name] on 09/23/23 at  4:30 PM EDT by a video enabled telemedicine application and verified that I am speaking with the correct person using two identifiers.  Location patient: home Location provider:work or home office Persons participating in the virtual visit: patient, provider  I discussed the limitations of evaluation and management by telemedicine and the availability of in person appointments. The patient expressed understanding and agreed to proceed. Chief Complaint  Patient presents with   Medical Management of Chronic Issues    Labs results, ALT    HPI: Pt is a 43 yo female seen for f/u and lab result review.  Pt had questions about elevated ALT.   Vitamin D  was low at 10.04.  Vitamin B 12 was low normal.  Pt notes a h/o having to supplement B12 due to gastric surg.  H/o r shoulder pain.  Feels like muscles won't relax.  Making it difficult to sleep.  Tylenol  helps some.  Can't take NSAIDs d/t h/o gastric surgery.  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  Still having to travel for work.  ROS: See pertinent positives and negatives per HPI.  Past Medical History:  Diagnosis Date   Anxiety    Depression    Diabetes mellitus 05/2008   DR. BALAN   GERD (gastroesophageal reflux disease)    after taking antibiotics it went away   Hypertension    Obesity     Past Surgical History:  Procedure Laterality Date   BREATH TEK H PYLORI N/A 08/20/2012   Procedure: BREATH TEK H PYLORI;  Surgeon: Redell Faith, DO;  Location: [facility];  Service: Endoscopy;  Laterality: N/A;   CHOLECYSTECTOMY N/A 02/22/2020   Procedure: LAPAROSCOPIC CHOLECYSTECTOMY WITH INTRAOPERATIVE CHOLANGIOGRAM;  Surgeon: Gladis Cough, MD;  Location: [facility];  Service: General;  Laterality: N/A;   GASTRIC ROUX-EN-Y N/A 08/24/2012   Procedure: LAPAROSCOPIC ROUX-EN-Y GASTRIC BYPASS WITH  UPPER ENDOSCOPY;  Surgeon: Redell Faith, DO;  Location: [facility];  Service: General;  Laterality: N/A;    Family History  Problem Relation Age of Onset   Diabetes Mother    Hypertension Mother    Miscarriages / [name] [name] Mother    Mental illness Brother    Diabetes Maternal Aunt    Dementia Maternal Grandmother    Diabetes Maternal Grandmother    Diabetes Maternal Grandfather      Current Outpatient Medications:    blood glucose meter kit and supplies KIT, Dispense based on patient and insurance preference. Use up to four times daily as directed., Disp: 1 each, Rfl: 0   cetirizine  (ZYRTEC ) 10 MG tablet, TAKE 1 TABLET BY MOUTH EVERY DAY (Patient taking differently: Take 10 mg by mouth daily.), Disp: 90 tablet, Rfl: 0   cyanocobalamin  (VITAMIN B12) 1000 MCG/ML injection, Inject 1 mL (1,000 mcg total) into the muscle every 30 (thirty) days., Disp: 10 mL, Rfl: 0   FINACEA 15 % FOAM, Apply 1 Application topically daily., Disp: , Rfl:    glucose blood (ACCU-CHEK GUIDE) test strip, Use up to four times daily to check blood sugar., Disp: 100 strip, Rfl: 3   Multiple Vitamin (MULTIVITAMIN) tablet, Take 1 tablet by mouth daily., Disp: , Rfl:    Semaglutide , 1 MG/DOSE, 4 MG/3ML SOPN, Inject 1 mg as directed once a week., Disp: 3 mL, Rfl: 11   Vitamin D , Ergocalciferol , (DRISDOL ) 1.25 MG (50000 UNIT) CAPS  capsule, Take 1 capsule (50,000 Units total) by mouth every 7 (seven) days., Disp: 12 capsule, Rfl: 0  EXAM:  VITALS per patient if applicable:  RR between 12-20 bpm  GENERAL: alert, oriented, appears well and in no acute distress  HEENT: atraumatic, conjunctiva clear, no obvious abnormalities on inspection of external nose and ears  NECK: normal movements of the head and neck  LUNGS: on inspection no signs of respiratory distress, breathing rate appears normal, no obvious gross SOB, gasping or wheezing  CV: no obvious cyanosis  MS: moves all visible extremities without noticeable  abnormality  PSYCH/NEURO: pleasant and cooperative, no obvious depression or anxiety, speech and thought processing grossly intact  ASSESSMENT AND PLAN:  Discussed the following assessment and plan:  Elevated ALT measurement  Vitamin B12 deficiency - Plan: cyanocobalamin  (VITAMIN B12) 1000 MCG/ML injection  Vitamin D  deficiency  History of Roux-en-Y gastric bypass  Strain of right trapezius muscle, initial encounter - Plan: cyclobenzaprine  (FLEXERIL ) 5 MG tablet  Muscle tightness - Plan: cyclobenzaprine  (FLEXERIL ) 5 MG tablet   ALT 52 on 09/14/23.  AST, T bili, Alk phos normal.  Pt with h/o gallstone pancreatitis and hepatitis, s/p cholecystectomy.  Continue to monitor.  Vitamin def likely from h/o roux en y gastric procedure.  Supplement B12 and vitamin D .  Supportive care for Muscle strain including stretching, heat, ice, topical analgesics, and Tylenol .  Start msk rlxr prn.  Consider PT.  Given strict precautions.  F/u prn.    I discussed the assessment and treatment plan with the patient. The patient was provided an opportunity to ask questions and all were answered. The patient agreed with the plan and demonstrated an understanding of the instructions.   The patient was advised to call back or seek an in-person evaluation if the symptoms worsen or if the condition fails to improve as anticipated.   [name], MD

## 2023-10-21 ENCOUNTER — Other Ambulatory Visit: Payer: Self-pay | Admitting: Family Medicine

## 2023-10-21 DIAGNOSIS — S46811A Strain of other muscles, fascia and tendons at shoulder and upper arm level, right arm, initial encounter: Secondary | ICD-10-CM

## 2023-10-21 DIAGNOSIS — M6289 Other specified disorders of muscle: Secondary | ICD-10-CM

## 2023-11-20 ENCOUNTER — Other Ambulatory Visit: Payer: Self-pay | Admitting: Family Medicine

## 2023-11-20 DIAGNOSIS — M6289 Other specified disorders of muscle: Secondary | ICD-10-CM

## 2023-11-20 DIAGNOSIS — S46811A Strain of other muscles, fascia and tendons at shoulder and upper arm level, right arm, initial encounter: Secondary | ICD-10-CM

## 2023-12-28 ENCOUNTER — Other Ambulatory Visit: Payer: Self-pay

## 2023-12-28 ENCOUNTER — Inpatient Hospital Stay

## 2023-12-28 ENCOUNTER — Telehealth: Payer: Self-pay | Admitting: Hematology and Oncology

## 2023-12-28 ENCOUNTER — Inpatient Hospital Stay: Attending: Hematology and Oncology | Admitting: Hematology and Oncology

## 2023-12-28 VITALS — BP 105/76 | HR 83 | Temp 98.2°F | Resp 14 | Ht 69.5 in | Wt 210.9 lb

## 2023-12-28 DIAGNOSIS — R5383 Other fatigue: Secondary | ICD-10-CM | POA: Insufficient documentation

## 2023-12-28 DIAGNOSIS — Z9884 Bariatric surgery status: Secondary | ICD-10-CM | POA: Insufficient documentation

## 2023-12-28 DIAGNOSIS — D509 Iron deficiency anemia, unspecified: Secondary | ICD-10-CM

## 2023-12-28 DIAGNOSIS — D508 Other iron deficiency anemias: Secondary | ICD-10-CM | POA: Diagnosis present

## 2023-12-28 LAB — CBC WITH DIFFERENTIAL (CANCER CENTER ONLY)
Abs Immature Granulocytes: 0.02 K/uL (ref 0.00–0.07)
Basophils Absolute: 0 K/uL (ref 0.0–0.1)
Basophils Relative: 1 %
Eosinophils Absolute: 0.1 K/uL (ref 0.0–0.5)
Eosinophils Relative: 3 %
HCT: 39.4 % (ref 36.0–46.0)
Hemoglobin: 12.7 g/dL (ref 12.0–15.0)
Immature Granulocytes: 1 %
Lymphocytes Relative: 36 %
Lymphs Abs: 1.5 K/uL (ref 0.7–4.0)
MCH: 27.9 pg (ref 26.0–34.0)
MCHC: 32.2 g/dL (ref 30.0–36.0)
MCV: 86.4 fL (ref 80.0–100.0)
Monocytes Absolute: 0.6 K/uL (ref 0.1–1.0)
Monocytes Relative: 13 %
Neutro Abs: 2 K/uL (ref 1.7–7.7)
Neutrophils Relative %: 46 %
Platelet Count: 256 K/uL (ref 150–400)
RBC: 4.56 MIL/uL (ref 3.87–5.11)
RDW: 12.2 % (ref 11.5–15.5)
WBC Count: 4.3 K/uL (ref 4.0–10.5)
nRBC: 0 % (ref 0.0–0.2)

## 2023-12-28 NOTE — Progress Notes (Signed)
 [facility]  [address] [place],  [place]  [address] [phone]  Clinic Day:  08/28/2023  Referring physician: Mercer Clotilda SAUNDERS, MD   HISTORY OF PRESENT ILLNESS:  The patient is a 43 y.o. female with iron  deficiency anemia secondary to her previous gastric bypass surgery.  She intermittently receives IV iron  replacement.  She had Venofer  again in April.  She comes in today to reassess her labs.  She continues to report fatigue.  She states her menses are fairly regular every 3 to 4 weeks lasting for about 5 days, but does report 2 heavy days.  She denies having other overt forms of blood loss.  She states she does not take a multivitamin.  VITALS:   There were no vitals taken for this visit. Wt Readings from Last 3 Encounters:  09/14/23 207 lb (93.9 kg)  08/28/23 210 lb (95.3 kg)  04/27/23 207 lb 14.4 oz (94.3 kg)   There is no height or weight on file to calculate BMI.  Performance status (ECOG): 1 - Symptomatic but completely ambulatory  PHYSICAL EXAM:   Physical Exam Vitals and nursing note reviewed.  Constitutional:      General: She is not in acute distress.    Appearance: Normal appearance. She is not ill-appearing.  HENT:     Head: Normocephalic and atraumatic.     Mouth/Throat:     Mouth: Mucous membranes are moist.     Pharynx: Oropharynx is clear. No oropharyngeal exudate or posterior oropharyngeal erythema.  Eyes:     General: No scleral icterus.    Extraocular Movements: Extraocular movements intact.     Conjunctiva/sclera: Conjunctivae normal.     Pupils: Pupils are equal, round, and reactive to light.  Cardiovascular:     Rate and Rhythm: Normal rate and regular rhythm.     Heart sounds: Normal heart sounds. No murmur heard.    No friction rub. No gallop.  Pulmonary:     Effort: Pulmonary effort is normal.     Breath sounds: Normal breath sounds. No wheezing, rhonchi or rales.  Abdominal:     General: There is no distension.      Palpations: Abdomen is soft. There is no hepatomegaly, splenomegaly or mass.     Tenderness: There is no abdominal tenderness.  Musculoskeletal:        General: Normal range of motion.     Cervical back: Normal range of motion and neck supple. No tenderness.     Right lower leg: No edema.     Left lower leg: No edema.  Lymphadenopathy:     Cervical: No cervical adenopathy.     Upper Body:     Right upper body: No supraclavicular or axillary adenopathy.     Left upper body: No supraclavicular or axillary adenopathy.     Lower Body: No right inguinal adenopathy. No left inguinal adenopathy.  Skin:    General: Skin is warm and dry.     Coloration: Skin is not jaundiced.     Findings: No rash.  Neurological:     Mental Status: She is alert and oriented to person, place, and time.     Cranial Nerves: No cranial nerve deficit.  Psychiatric:        Mood and Affect: Mood normal.        Behavior: Behavior normal.        Thought Content: Thought content normal.      LABS:      Latest Ref  Rng & Units 09/14/2023   11:40 AM 08/28/2023    9:06 AM 04/27/2023    1:56 PM  CBC  WBC 4.0 - 10.5 K/uL 4.2  4.6  5.2   Hemoglobin 12.0 - 15.0 g/dL 86.5  86.6  87.6   Hematocrit 36.0 - 46.0 % 40.8  40.4  38.8   Platelets 150.0 - 400.0 K/uL 272.0  275  256       Latest Ref Rng & Units 09/14/2023   11:40 AM 11/16/2022    7:07 PM 09/25/2021   11:56 AM  CMP  Glucose 70 - 99 mg/dL 92  99  896   BUN 6 - 23 mg/dL 7  8  8    Creatinine 0.40 - 1.20 mg/dL 9.18  9.25  9.23   Sodium 135 - 145 mEq/L 135  135  138   Potassium 3.5 - 5.1 mEq/L 3.9  3.6  4.0   Chloride 96 - 112 mEq/L 100  102  104   CO2 19 - 32 mEq/L 29  23  28    Calcium 8.4 - 10.5 mg/dL 8.7  8.5  9.1   Total Protein 6.0 - 8.3 g/dL 7.2   7.3   Total Bilirubin 0.2 - 1.2 mg/dL 0.5   0.4   Alkaline Phos 39 - 117 U/L 79   80   AST 0 - 37 U/L 37   29   ALT 0 - 35 U/L 52   38      Lab Results  Component Value Date   TIBC 385 08/28/2023   TIBC  525 (H) 04/27/2023   TIBC 438 10/15/2022   FERRITIN 99 08/28/2023   FERRITIN 8 (L) 04/27/2023   FERRITIN 15 10/15/2022   IRONPCTSAT 22 08/28/2023   IRONPCTSAT 18 04/27/2023   IRONPCTSAT 16 10/15/2022    Review Flowsheet  More data exists      Latest Ref Rng & Units 10/15/2022 04/27/2023 08/28/2023  Oncology Labs  Ferritin 11 - 307 ng/mL 15  8  99   %SAT 10.4 - 31.8 % 16  18  22       STUDIES:   No results found.    ASSESSMENT & PLAN:   Assessment/Plan:  43 y.o. female with iron  deficiency anemia due to decreased absorption after gastric bypass surgery.  Her iron  stores are adequate today.  I recommended she try a multivitamin with iron .  I will plan to see her back in 4 months for repeat clinical assessment.  I have encouraged her to follow-up with her primary care provider regarding other vitamin deficiencies.  The patient understands all the plans discussed today and is in agreement with them.  She knows to contact our office if she develops concerns prior to her next appointment.     Eleanor DELENA Bach, NP   Physician Assistant [facility] [place] [phone]

## 2023-12-28 NOTE — Telephone Encounter (Signed)
 Patient has been scheduled for follow-up visit per 12/28/2023 LOS.  Pt noted appt details on personal electronic device.

## 2024-01-05 ENCOUNTER — Telehealth: Payer: Self-pay

## 2024-01-05 NOTE — Telephone Encounter (Signed)
 Pt has called asking for her iron  results. States she saw her other labs in my chart, but not the iron , ferritin Please advise.

## 2024-01-29 ENCOUNTER — Telehealth: Payer: Self-pay

## 2024-01-29 ENCOUNTER — Other Ambulatory Visit (HOSPITAL_COMMUNITY): Payer: Self-pay

## 2024-01-29 ENCOUNTER — Encounter: Payer: Self-pay | Admitting: Oncology

## 2024-01-29 NOTE — Telephone Encounter (Signed)
 Pharmacy Patient Advocate Encounter   Received notification from [facility] that prior authorization for Ozempic  4 is required/requested.   Insurance verification completed.   The patient is insured through [facility].   Per test claim: PA required; PA submitted to above mentioned insurance via Latent Key/confirmation #/EOC [id number] Status is pending

## 2024-02-03 ENCOUNTER — Telehealth: Payer: Self-pay

## 2024-02-03 NOTE — Telephone Encounter (Signed)
 Patient called and voiced she felt her iron  was low again wanting to have labs repeated that weren't done at last visit. She was offered to come back for labs per telephone triage call on 01/05/2024 but did not want to come at that time. She voiced she is felling  fatigued , brain fog and weak same as she felt when her iron  was low in the past . She asked for iron , ferritin and B 12  specifically and whatever else is needed thanks.

## 2024-02-05 ENCOUNTER — Other Ambulatory Visit (HOSPITAL_COMMUNITY): Payer: Self-pay

## 2024-02-05 NOTE — Telephone Encounter (Signed)
 Pharmacy Patient Advocate Encounter  Received notification from [employer] that Prior Authorization for Ozempic  4 has been APPROVED from 01/29/24 to 01/28/25. Unable to obtain price due to refill too soon rejection, last fill date 02/01/24 next available fill date2/2/26   PA #/Case ID/Reference #: # [id number]

## 2024-02-09 ENCOUNTER — Other Ambulatory Visit: Payer: Self-pay

## 2024-02-09 DIAGNOSIS — D508 Other iron deficiency anemias: Secondary | ICD-10-CM

## 2024-02-10 ENCOUNTER — Inpatient Hospital Stay: Attending: Hematology and Oncology

## 2024-02-10 DIAGNOSIS — Z9884 Bariatric surgery status: Secondary | ICD-10-CM | POA: Diagnosis not present

## 2024-02-10 DIAGNOSIS — R5383 Other fatigue: Secondary | ICD-10-CM | POA: Insufficient documentation

## 2024-02-10 DIAGNOSIS — D508 Other iron deficiency anemias: Secondary | ICD-10-CM | POA: Diagnosis present

## 2024-02-10 LAB — FERRITIN: Ferritin: 29 ng/mL (ref 11–307)

## 2024-02-10 LAB — IRON AND TIBC
Iron: 134 ug/dL (ref 28–170)
Saturation Ratios: 32 % — ABNORMAL HIGH (ref 10.4–31.8)
TIBC: 417 ug/dL (ref 250–450)
UIBC: 283 ug/dL

## 2024-02-10 LAB — VITAMIN B12: Vitamin B-12: 481 pg/mL (ref 180–914)

## 2024-02-10 LAB — FOLATE: Folate: 6.1 ng/mL

## 2024-02-10 NOTE — Telephone Encounter (Addendum)
 Please advise.   Latest Reference Range & Units 02/10/24 08:55 02/10/24 08:56  Iron  28 - 170 ug/dL  865  UIBC ug/dL  716  TIBC 749 - 549 ug/dL  582  Saturation Ratios 10.4 - 31.8 %  32 (H)  Ferritin 11 - 307 ng/mL  29  Folate >5.9 ng/mL 6.1   Vitamin B12 180 - 914 pg/mL 481   (H): Data is abnormally high

## 2024-02-11 NOTE — Telephone Encounter (Signed)
 Dr Ezzard states he has seen her labs much worse, but he respects the trending down of the ferritin. He asked if she could take oral iron . If not, can set her up for IV iron .

## 2024-02-12 ENCOUNTER — Telehealth: Payer: Self-pay | Admitting: Oncology

## 2024-02-12 NOTE — Telephone Encounter (Signed)
 Patient has been scheduled. Aware of appt date and time.   Scheduling Message Entered by [place], AMY W on 02/11/2024 at  3:36 PM Priority: High INFUSION 1HR30MIN (90)  Department: [facility]  Provider: Ezzard Valaria LABOR, MD  Appointment Notes:  iron  per Ezzard

## 2024-02-18 ENCOUNTER — Encounter: Payer: Self-pay | Admitting: Oncology

## 2024-02-19 ENCOUNTER — Inpatient Hospital Stay

## 2024-02-19 VITALS — BP 111/83 | HR 80 | Temp 97.7°F | Resp 16

## 2024-02-19 DIAGNOSIS — D508 Other iron deficiency anemias: Secondary | ICD-10-CM | POA: Diagnosis not present

## 2024-02-19 DIAGNOSIS — D509 Iron deficiency anemia, unspecified: Secondary | ICD-10-CM

## 2024-02-19 DIAGNOSIS — Z9884 Bariatric surgery status: Secondary | ICD-10-CM

## 2024-02-19 MED ORDER — IRON SUCROSE 200 MG IVPB - SIMPLE MED
200.0000 mg | Freq: Once | Status: AC
  Administered 2024-02-19: 200 mg via INTRAVENOUS
  Filled 2024-02-19: qty 200

## 2024-02-19 MED ORDER — IRON SUCROSE 20 MG/ML IV SOLN: 200.0000 mg | Freq: Once | INTRAVENOUS | Status: DC

## 2024-02-19 MED ORDER — SODIUM CHLORIDE 0.9 % IV SOLN: Freq: Once | INTRAVENOUS | Status: AC

## 2024-02-19 NOTE — Patient Instructions (Signed)
 Iron  Sucrose Injection What is this medication? IRON  SUCROSE (EYE ern SOO krose) treats low levels of iron  (iron  deficiency anemia) in people with kidney disease. Iron  is a mineral that plays an important role in making red blood cells, which carry oxygen from your lungs to the rest of your body. This medicine may be used for other purposes; ask your health care provider or pharmacist if you have questions. COMMON BRAND NAME(S): Venofer  What should I tell my care team before I take this medication? They need to know if you have any of these conditions: Anemia not caused by low iron  levels Heart disease High levels of iron  in the blood Kidney disease Liver disease An unusual or allergic reaction to iron , other medications, foods, dyes, or preservatives Pregnant or trying to get pregnant Breastfeeding How should I use this medication? This medication is infused into a vein. It is given by your care team in a hospital or clinic setting. Talk to your care team about the use of this medication in children. While it may be prescribed for children as young as 2 years for selected conditions, precautions do apply. Overdosage: If you think you have taken too much of this medicine contact a poison control center or emergency room at once. NOTE: This medicine is only for you. Do not share this medicine with others. What if I miss a dose? Keep appointments for follow-up doses. It is important not to miss your dose. Call your care team if you are unable to keep an appointment. What may interact with this medication? Do not take this medication with any of the following: Deferoxamine Dimercaprol Other iron  products This medication may also interact with the following: Chloramphenicol Deferasirox This list may not describe all possible interactions. Give your health care provider a list of all the medicines, herbs, non-prescription drugs, or dietary supplements you use. Also tell them if you smoke,  drink alcohol, or use illegal drugs. Some items may interact with your medicine. What should I watch for while using this medication? Your condition will be monitored carefully while you are receiving this medication. Tell your care team if your symptoms do not start to get better or if they get worse. You may need blood work done while you are taking this medication. Sometimes, when medications are infused into veins, a little can leak out of the vein and into the tissue around it. If this medication leaks, it can cause a brown or dark stain on the skin. This is not common. It may be permanent. If you feel pain or swelling during your infusion, tell your care team right away. They can stop the infusion and treat the area. You may need to eat more foods that contain iron . Talk to your care team. Foods that contain iron  include whole grains or cereals, dried fruits, beans, peas, leafy green vegetables, and organ meats (liver, kidney). What side effects may I notice from receiving this medication? Side effects that you should report to your care team as soon as possible: Allergic reactions--skin rash, itching, hives, swelling of the face, lips, tongue, or throat Low blood pressure--dizziness, feeling faint or lightheaded, blurry vision Painful swelling, warmth, or redness of the skin, brown or dark skin color at the infusion site Shortness of breath Side effects that usually do not require medical attention (report these to your care team if they continue or are bothersome): Flushing Headache Joint pain Muscle pain Nausea This list may not describe all possible side effects. Call your  doctor for medical advice about side effects. You may report side effects to FDA at 1-800-FDA-1088. Where should I keep my medication? This medication is given in a hospital or clinic. It will not be stored at home. NOTE: This sheet is a summary. It may not cover all possible information. If you have questions about  this medicine, talk to your doctor, pharmacist, or health care provider.  2025 Elsevier/Gold Standard (2023-11-25 00:00:00)

## 2024-02-23 ENCOUNTER — Inpatient Hospital Stay

## 2024-02-24 ENCOUNTER — Encounter: Payer: Self-pay | Admitting: Oncology

## 2024-02-24 ENCOUNTER — Inpatient Hospital Stay: Attending: Hematology and Oncology

## 2024-02-24 VITALS — BP 115/88 | HR 89 | Temp 98.2°F | Resp 20

## 2024-02-24 DIAGNOSIS — D509 Iron deficiency anemia, unspecified: Secondary | ICD-10-CM

## 2024-02-24 DIAGNOSIS — Z9884 Bariatric surgery status: Secondary | ICD-10-CM

## 2024-02-24 MED ORDER — IRON SUCROSE 20 MG/ML IV SOLN
200.0000 mg | Freq: Once | INTRAVENOUS | Status: AC
  Administered 2024-02-24: 200 mg via INTRAVENOUS
  Filled 2024-02-24: qty 10

## 2024-02-24 MED ORDER — SODIUM CHLORIDE 0.9 % IV SOLN: Freq: Once | INTRAVENOUS | Status: DC

## 2024-02-24 NOTE — Patient Instructions (Signed)
 Iron  Sucrose Injection What is this medication? IRON  SUCROSE (EYE ern SOO krose) treats low levels of iron  (iron  deficiency anemia) in people with kidney disease. Iron  is a mineral that plays an important role in making red blood cells, which carry oxygen from your lungs to the rest of your body. This medicine may be used for other purposes; ask your health care provider or pharmacist if you have questions. COMMON BRAND NAME(S): Venofer  What should I tell my care team before I take this medication? They need to know if you have any of these conditions: Anemia not caused by low iron  levels Heart disease High levels of iron  in the blood Kidney disease Liver disease An unusual or allergic reaction to iron , other medications, foods, dyes, or preservatives Pregnant or trying to get pregnant Breastfeeding How should I use this medication? This medication is infused into a vein. It is given by your care team in a hospital or clinic setting. Talk to your care team about the use of this medication in children. While it may be prescribed for children as young as 2 years for selected conditions, precautions do apply. Overdosage: If you think you have taken too much of this medicine contact a poison control center or emergency room at once. NOTE: This medicine is only for you. Do not share this medicine with others. What if I miss a dose? Keep appointments for follow-up doses. It is important not to miss your dose. Call your care team if you are unable to keep an appointment. What may interact with this medication? Do not take this medication with any of the following: Deferoxamine Dimercaprol Other iron  products This medication may also interact with the following: Chloramphenicol Deferasirox This list may not describe all possible interactions. Give your health care provider a list of all the medicines, herbs, non-prescription drugs, or dietary supplements you use. Also tell them if you smoke,  drink alcohol, or use illegal drugs. Some items may interact with your medicine. What should I watch for while using this medication? Your condition will be monitored carefully while you are receiving this medication. Tell your care team if your symptoms do not start to get better or if they get worse. You may need blood work done while you are taking this medication. Sometimes, when medications are infused into veins, a little can leak out of the vein and into the tissue around it. If this medication leaks, it can cause a brown or dark stain on the skin. This is not common. It may be permanent. If you feel pain or swelling during your infusion, tell your care team right away. They can stop the infusion and treat the area. You may need to eat more foods that contain iron . Talk to your care team. Foods that contain iron  include whole grains or cereals, dried fruits, beans, peas, leafy green vegetables, and organ meats (liver, kidney). What side effects may I notice from receiving this medication? Side effects that you should report to your care team as soon as possible: Allergic reactions--skin rash, itching, hives, swelling of the face, lips, tongue, or throat Low blood pressure--dizziness, feeling faint or lightheaded, blurry vision Painful swelling, warmth, or redness of the skin, brown or dark skin color at the infusion site Shortness of breath Side effects that usually do not require medical attention (report these to your care team if they continue or are bothersome): Flushing Headache Joint pain Muscle pain Nausea This list may not describe all possible side effects. Call your  doctor for medical advice about side effects. You may report side effects to FDA at 1-800-FDA-1088. Where should I keep my medication? This medication is given in a hospital or clinic. It will not be stored at home. NOTE: This sheet is a summary. It may not cover all possible information. If you have questions about  this medicine, talk to your doctor, pharmacist, or health care provider.  2025 Elsevier/Gold Standard (2023-11-25 00:00:00)

## 2024-02-25 ENCOUNTER — Inpatient Hospital Stay

## 2024-02-25 VITALS — BP 122/86 | HR 96 | Temp 98.1°F | Resp 18

## 2024-02-25 DIAGNOSIS — D509 Iron deficiency anemia, unspecified: Secondary | ICD-10-CM

## 2024-02-25 DIAGNOSIS — Z9884 Bariatric surgery status: Secondary | ICD-10-CM

## 2024-02-25 MED ORDER — SODIUM CHLORIDE 0.9 % IV SOLN: Freq: Once | INTRAVENOUS | Status: DC

## 2024-02-25 MED ORDER — IRON SUCROSE 20 MG/ML IV SOLN
200.0000 mg | Freq: Once | INTRAVENOUS | Status: AC
  Administered 2024-02-25: 200 mg via INTRAVENOUS
  Filled 2024-02-25: qty 10

## 2024-02-25 NOTE — Patient Instructions (Signed)
 Iron  Sucrose Injection What is this medication? IRON  SUCROSE (EYE ern SOO krose) treats low levels of iron  (iron  deficiency anemia) in people with kidney disease. Iron  is a mineral that plays an important role in making red blood cells, which carry oxygen from your lungs to the rest of your body. This medicine may be used for other purposes; ask your health care provider or pharmacist if you have questions. COMMON BRAND NAME(S): Venofer  What should I tell my care team before I take this medication? They need to know if you have any of these conditions: Anemia not caused by low iron  levels Heart disease High levels of iron  in the blood Kidney disease Liver disease An unusual or allergic reaction to iron , other medications, foods, dyes, or preservatives Pregnant or trying to get pregnant Breastfeeding How should I use this medication? This medication is infused into a vein. It is given by your care team in a hospital or clinic setting. Talk to your care team about the use of this medication in children. While it may be prescribed for children as young as 2 years for selected conditions, precautions do apply. Overdosage: If you think you have taken too much of this medicine contact a poison control center or emergency room at once. NOTE: This medicine is only for you. Do not share this medicine with others. What if I miss a dose? Keep appointments for follow-up doses. It is important not to miss your dose. Call your care team if you are unable to keep an appointment. What may interact with this medication? Do not take this medication with any of the following: Deferoxamine Dimercaprol Other iron  products This medication may also interact with the following: Chloramphenicol Deferasirox This list may not describe all possible interactions. Give your health care provider a list of all the medicines, herbs, non-prescription drugs, or dietary supplements you use. Also tell them if you smoke,  drink alcohol, or use illegal drugs. Some items may interact with your medicine. What should I watch for while using this medication? Your condition will be monitored carefully while you are receiving this medication. Tell your care team if your symptoms do not start to get better or if they get worse. You may need blood work done while you are taking this medication. Sometimes, when medications are infused into veins, a little can leak out of the vein and into the tissue around it. If this medication leaks, it can cause a brown or dark stain on the skin. This is not common. It may be permanent. If you feel pain or swelling during your infusion, tell your care team right away. They can stop the infusion and treat the area. You may need to eat more foods that contain iron . Talk to your care team. Foods that contain iron  include whole grains or cereals, dried fruits, beans, peas, leafy green vegetables, and organ meats (liver, kidney). What side effects may I notice from receiving this medication? Side effects that you should report to your care team as soon as possible: Allergic reactions--skin rash, itching, hives, swelling of the face, lips, tongue, or throat Low blood pressure--dizziness, feeling faint or lightheaded, blurry vision Painful swelling, warmth, or redness of the skin, brown or dark skin color at the infusion site Shortness of breath Side effects that usually do not require medical attention (report these to your care team if they continue or are bothersome): Flushing Headache Joint pain Muscle pain Nausea This list may not describe all possible side effects. Call your  doctor for medical advice about side effects. You may report side effects to FDA at 1-800-FDA-1088. Where should I keep my medication? This medication is given in a hospital or clinic. It will not be stored at home. NOTE: This sheet is a summary. It may not cover all possible information. If you have questions about  this medicine, talk to your doctor, pharmacist, or health care provider.  2025 Elsevier/Gold Standard (2023-11-25 00:00:00)

## 2024-02-26 ENCOUNTER — Inpatient Hospital Stay

## 2024-02-26 VITALS — BP 111/75 | HR 85 | Temp 98.3°F | Resp 18 | Ht 69.5 in | Wt 210.1 lb

## 2024-02-26 DIAGNOSIS — Z9884 Bariatric surgery status: Secondary | ICD-10-CM

## 2024-02-26 DIAGNOSIS — D509 Iron deficiency anemia, unspecified: Secondary | ICD-10-CM

## 2024-02-26 MED ORDER — SODIUM CHLORIDE 0.9 % IV SOLN: Freq: Once | INTRAVENOUS | Status: AC

## 2024-02-26 MED ORDER — IRON SUCROSE 20 MG/ML IV SOLN
200.0000 mg | Freq: Once | INTRAVENOUS | Status: AC
  Administered 2024-02-26: 200 mg via INTRAVENOUS
  Filled 2024-02-26: qty 10

## 2024-02-26 NOTE — Patient Instructions (Signed)
 Iron  Sucrose Injection What is this medication? IRON  SUCROSE (EYE ern SOO krose) treats low levels of iron  (iron  deficiency anemia) in people with kidney disease. Iron  is a mineral that plays an important role in making red blood cells, which carry oxygen from your lungs to the rest of your body. This medicine may be used for other purposes; ask your health care provider or pharmacist if you have questions. COMMON BRAND NAME(S): Venofer  What should I tell my care team before I take this medication? They need to know if you have any of these conditions: Anemia not caused by low iron  levels Heart disease High levels of iron  in the blood Kidney disease Liver disease An unusual or allergic reaction to iron , other medications, foods, dyes, or preservatives Pregnant or trying to get pregnant Breastfeeding How should I use this medication? This medication is infused into a vein. It is given by your care team in a hospital or clinic setting. Talk to your care team about the use of this medication in children. While it may be prescribed for children as young as 2 years for selected conditions, precautions do apply. Overdosage: If you think you have taken too much of this medicine contact a poison control center or emergency room at once. NOTE: This medicine is only for you. Do not share this medicine with others. What if I miss a dose? Keep appointments for follow-up doses. It is important not to miss your dose. Call your care team if you are unable to keep an appointment. What may interact with this medication? Do not take this medication with any of the following: Deferoxamine Dimercaprol Other iron  products This medication may also interact with the following: Chloramphenicol Deferasirox This list may not describe all possible interactions. Give your health care provider a list of all the medicines, herbs, non-prescription drugs, or dietary supplements you use. Also tell them if you smoke,  drink alcohol, or use illegal drugs. Some items may interact with your medicine. What should I watch for while using this medication? Your condition will be monitored carefully while you are receiving this medication. Tell your care team if your symptoms do not start to get better or if they get worse. You may need blood work done while you are taking this medication. Sometimes, when medications are infused into veins, a little can leak out of the vein and into the tissue around it. If this medication leaks, it can cause a brown or dark stain on the skin. This is not common. It may be permanent. If you feel pain or swelling during your infusion, tell your care team right away. They can stop the infusion and treat the area. You may need to eat more foods that contain iron . Talk to your care team. Foods that contain iron  include whole grains or cereals, dried fruits, beans, peas, leafy green vegetables, and organ meats (liver, kidney). What side effects may I notice from receiving this medication? Side effects that you should report to your care team as soon as possible: Allergic reactions--skin rash, itching, hives, swelling of the face, lips, tongue, or throat Low blood pressure--dizziness, feeling faint or lightheaded, blurry vision Painful swelling, warmth, or redness of the skin, brown or dark skin color at the infusion site Shortness of breath Side effects that usually do not require medical attention (report these to your care team if they continue or are bothersome): Flushing Headache Joint pain Muscle pain Nausea This list may not describe all possible side effects. Call your  doctor for medical advice about side effects. You may report side effects to FDA at 1-800-FDA-1088. Where should I keep my medication? This medication is given in a hospital or clinic. It will not be stored at home. NOTE: This sheet is a summary. It may not cover all possible information. If you have questions about  this medicine, talk to your doctor, pharmacist, or health care provider.  2025 Elsevier/Gold Standard (2023-11-25 00:00:00)

## 2024-02-29 ENCOUNTER — Inpatient Hospital Stay

## 2024-03-01 ENCOUNTER — Inpatient Hospital Stay

## 2024-03-03 ENCOUNTER — Inpatient Hospital Stay

## 2024-04-27 ENCOUNTER — Inpatient Hospital Stay: Admitting: Hematology and Oncology

## 2024-04-27 ENCOUNTER — Inpatient Hospital Stay
